# Patient Record
Sex: Male | Born: 1970
Health system: Southern US, Community
[De-identification: ages and names within clinical notes are randomized; demographics above are authoritative.]

## PROBLEM LIST (undated history)

## (undated) DIAGNOSIS — M2550 Pain in unspecified joint: Secondary | ICD-10-CM

## (undated) DIAGNOSIS — I10 Essential (primary) hypertension: Secondary | ICD-10-CM

## (undated) DIAGNOSIS — F988 Other specified behavioral and emotional disorders with onset usually occurring in childhood and adolescence: Secondary | ICD-10-CM

## (undated) DIAGNOSIS — M26609 Unspecified temporomandibular joint disorder, unspecified side: Secondary | ICD-10-CM

## (undated) DIAGNOSIS — M5030 Other cervical disc degeneration, unspecified cervical region: Secondary | ICD-10-CM

## (undated) DIAGNOSIS — F32A Depression, unspecified: Secondary | ICD-10-CM

## (undated) DIAGNOSIS — T7840XA Allergy, unspecified, initial encounter: Secondary | ICD-10-CM

## (undated) DIAGNOSIS — G629 Polyneuropathy, unspecified: Secondary | ICD-10-CM

## (undated) DIAGNOSIS — M1990 Unspecified osteoarthritis, unspecified site: Secondary | ICD-10-CM

## (undated) DIAGNOSIS — J45909 Unspecified asthma, uncomplicated: Secondary | ICD-10-CM

## (undated) DIAGNOSIS — M4302 Spondylolysis, cervical region: Secondary | ICD-10-CM

## (undated) DIAGNOSIS — E291 Testicular hypofunction: Secondary | ICD-10-CM

## (undated) DIAGNOSIS — F329 Major depressive disorder, single episode, unspecified: Secondary | ICD-10-CM

## (undated) DIAGNOSIS — N200 Calculus of kidney: Secondary | ICD-10-CM

## (undated) DIAGNOSIS — G894 Chronic pain syndrome: Secondary | ICD-10-CM

## (undated) HISTORY — DX: Calculus of kidney: N20.0

## (undated) HISTORY — DX: Spondylolysis, cervical region: M43.02

## (undated) HISTORY — DX: Pain in unspecified joint: M25.50

## (undated) HISTORY — PX: CHEST TUBE INSERTION: SHX231

## (undated) HISTORY — DX: Unspecified osteoarthritis, unspecified site: M19.90

## (undated) HISTORY — DX: Chronic pain syndrome: G89.4

## (undated) HISTORY — DX: Testicular hypofunction: E29.1

## (undated) HISTORY — DX: Other specified behavioral and emotional disorders with onset usually occurring in childhood and adolescence: F98.8

## (undated) HISTORY — DX: Depression, unspecified: F32.A

## (undated) HISTORY — DX: Unspecified temporomandibular joint disorder, unspecified side: M26.609

## (undated) HISTORY — DX: Polyneuropathy, unspecified: G62.9

## (undated) HISTORY — DX: Unspecified asthma, uncomplicated: J45.909

## (undated) HISTORY — DX: Essential (primary) hypertension: I10

## (undated) HISTORY — DX: Other cervical disc degeneration, unspecified cervical region: M50.30

## (undated) HISTORY — DX: Allergy, unspecified, initial encounter: T78.40XA

## (undated) HISTORY — DX: Major depressive disorder, single episode, unspecified: F32.9

---

## 2000-11-30 ENCOUNTER — Encounter: Payer: Self-pay | Admitting: Family Medicine

## 2000-11-30 ENCOUNTER — Ambulatory Visit (HOSPITAL_COMMUNITY): Admission: RE | Admit: 2000-11-30 | Discharge: 2000-11-30 | Payer: Self-pay | Admitting: Family Medicine

## 2001-01-15 ENCOUNTER — Encounter: Admission: RE | Admit: 2001-01-15 | Discharge: 2001-01-15 | Payer: Self-pay | Admitting: Occupational Medicine

## 2001-01-15 ENCOUNTER — Encounter: Payer: Self-pay | Admitting: Occupational Medicine

## 2001-03-02 ENCOUNTER — Emergency Department (HOSPITAL_COMMUNITY): Admission: EM | Admit: 2001-03-02 | Discharge: 2001-03-02 | Payer: Self-pay | Admitting: Emergency Medicine

## 2001-03-02 ENCOUNTER — Encounter: Payer: Self-pay | Admitting: Emergency Medicine

## 2002-01-24 HISTORY — PX: BILATERAL CARPAL TUNNEL RELEASE: SHX6508

## 2003-12-13 ENCOUNTER — Ambulatory Visit (HOSPITAL_COMMUNITY): Admission: RE | Admit: 2003-12-13 | Discharge: 2003-12-13 | Payer: Self-pay | Admitting: Orthopedic Surgery

## 2004-05-27 ENCOUNTER — Ambulatory Visit (HOSPITAL_COMMUNITY): Admission: RE | Admit: 2004-05-27 | Discharge: 2004-05-27 | Payer: Self-pay | Admitting: Family Medicine

## 2005-09-16 ENCOUNTER — Emergency Department (HOSPITAL_COMMUNITY): Admission: EM | Admit: 2005-09-16 | Discharge: 2005-09-16 | Payer: Self-pay | Admitting: *Deleted

## 2007-09-12 ENCOUNTER — Emergency Department (HOSPITAL_COMMUNITY): Admission: EM | Admit: 2007-09-12 | Discharge: 2007-09-12 | Payer: Self-pay | Admitting: Emergency Medicine

## 2007-10-02 ENCOUNTER — Emergency Department (HOSPITAL_COMMUNITY): Admission: EM | Admit: 2007-10-02 | Discharge: 2007-10-02 | Payer: Self-pay | Admitting: Emergency Medicine

## 2008-04-02 ENCOUNTER — Emergency Department (HOSPITAL_COMMUNITY): Admission: EM | Admit: 2008-04-02 | Discharge: 2008-04-02 | Payer: Self-pay | Admitting: Emergency Medicine

## 2009-05-20 ENCOUNTER — Ambulatory Visit: Payer: Self-pay | Admitting: Cardiology

## 2009-05-20 ENCOUNTER — Inpatient Hospital Stay (HOSPITAL_COMMUNITY): Admission: EM | Admit: 2009-05-20 | Discharge: 2009-05-29 | Payer: Self-pay | Admitting: Emergency Medicine

## 2009-05-22 ENCOUNTER — Encounter (INDEPENDENT_AMBULATORY_CARE_PROVIDER_SITE_OTHER): Payer: Self-pay | Admitting: Surgery

## 2010-01-04 ENCOUNTER — Emergency Department (HOSPITAL_COMMUNITY)
Admission: EM | Admit: 2010-01-04 | Discharge: 2010-01-04 | Payer: Self-pay | Source: Home / Self Care | Admitting: Emergency Medicine

## 2010-04-05 LAB — POCT I-STAT, CHEM 8
BUN: 17 mg/dL (ref 6–23)
Calcium, Ion: 1.05 mmol/L — ABNORMAL LOW (ref 1.12–1.32)
Chloride: 100 mEq/L (ref 96–112)
Creatinine, Ser: 1.4 mg/dL (ref 0.4–1.5)
Glucose, Bld: 119 mg/dL — ABNORMAL HIGH (ref 70–99)
HCT: 42 % (ref 39.0–52.0)
Hemoglobin: 14.3 g/dL (ref 13.0–17.0)
Potassium: 2.9 mEq/L — ABNORMAL LOW (ref 3.5–5.1)
Sodium: 136 mEq/L (ref 135–145)
TCO2: 25 mmol/L (ref 0–100)

## 2010-04-05 LAB — URINALYSIS, ROUTINE W REFLEX MICROSCOPIC
Glucose, UA: NEGATIVE mg/dL
Ketones, ur: 15 mg/dL — AB
Nitrite: NEGATIVE
Protein, ur: 100 mg/dL — AB
Specific Gravity, Urine: 1.029 (ref 1.005–1.030)
Urobilinogen, UA: 1 mg/dL (ref 0.0–1.0)
pH: 8 (ref 5.0–8.0)

## 2010-04-05 LAB — URINE MICROSCOPIC-ADD ON

## 2010-04-13 LAB — PREPARE RBC (CROSSMATCH)

## 2010-04-13 LAB — BASIC METABOLIC PANEL
BUN: 4 mg/dL — ABNORMAL LOW (ref 6–23)
BUN: 5 mg/dL — ABNORMAL LOW (ref 6–23)
BUN: 10 mg/dL (ref 6–23)
BUN: 12 mg/dL (ref 6–23)
BUN: 14 mg/dL (ref 6–23)
BUN: 15 mg/dL (ref 6–23)
CO2: 32 mEq/L (ref 19–32)
CO2: 34 mEq/L — ABNORMAL HIGH (ref 19–32)
CO2: 24 mEq/L (ref 19–32)
CO2: 25 mEq/L (ref 19–32)
CO2: 27 mEq/L (ref 19–32)
CO2: 28 mEq/L (ref 19–32)
Calcium: 7.9 mg/dL — ABNORMAL LOW (ref 8.4–10.5)
Calcium: 8 mg/dL — ABNORMAL LOW (ref 8.4–10.5)
Calcium: 7 mg/dL — ABNORMAL LOW (ref 8.4–10.5)
Calcium: 7.2 mg/dL — ABNORMAL LOW (ref 8.4–10.5)
Calcium: 7.4 mg/dL — ABNORMAL LOW (ref 8.4–10.5)
Calcium: 8 mg/dL — ABNORMAL LOW (ref 8.4–10.5)
Chloride: 96 mEq/L (ref 96–112)
Chloride: 98 mEq/L (ref 96–112)
Chloride: 101 mEq/L (ref 96–112)
Chloride: 104 mEq/L (ref 96–112)
Chloride: 104 mEq/L (ref 96–112)
Chloride: 105 mEq/L (ref 96–112)
Creatinine, Ser: 0.77 mg/dL (ref 0.4–1.5)
Creatinine, Ser: 0.77 mg/dL (ref 0.4–1.5)
Creatinine, Ser: 1.06 mg/dL (ref 0.4–1.5)
Creatinine, Ser: 1.17 mg/dL (ref 0.4–1.5)
Creatinine, Ser: 1.21 mg/dL (ref 0.4–1.5)
Creatinine, Ser: 1.25 mg/dL (ref 0.4–1.5)
GFR calc Af Amer: >60 mL/min (ref >60)
GFR calc Af Amer: >60 mL/min (ref >60)
GFR calc Af Amer: >60 mL/min (ref >60)
GFR calc Af Amer: >60 mL/min (ref >60)
GFR calc Af Amer: >60 mL/min (ref >60)
GFR calc Af Amer: >60 mL/min (ref >60)
GFR calc non Af Amer: >60 mL/min (ref >60)
GFR calc non Af Amer: >60 mL/min (ref >60)
GFR calc non Af Amer: >60 mL/min (ref >60)
GFR calc non Af Amer: >60 mL/min (ref >60)
GFR calc non Af Amer: >60 mL/min (ref >60)
GFR calc non Af Amer: >60 mL/min (ref >60)
Glucose, Bld: 121 mg/dL — ABNORMAL HIGH (ref 70–99)
Glucose, Bld: 128 mg/dL — ABNORMAL HIGH (ref 70–99)
Glucose, Bld: 134 mg/dL — ABNORMAL HIGH (ref 70–99)
Glucose, Bld: 142 mg/dL — ABNORMAL HIGH (ref 70–99)
Glucose, Bld: 171 mg/dL — ABNORMAL HIGH (ref 70–99)
Glucose, Bld: 190 mg/dL — ABNORMAL HIGH (ref 70–99)
Potassium: 3.5 mEq/L (ref 3.5–5.1)
Potassium: 4.6 mEq/L (ref 3.5–5.1)
Potassium: 3.9 mEq/L (ref 3.5–5.1)
Potassium: 4.5 mEq/L (ref 3.5–5.1)
Potassium: 4.8 mEq/L (ref 3.5–5.1)
Potassium: 5.4 mEq/L — ABNORMAL HIGH (ref 3.5–5.1)
Sodium: 133 mEq/L — ABNORMAL LOW (ref 135–145)
Sodium: 137 mEq/L (ref 135–145)
Sodium: 132 mEq/L — ABNORMAL LOW (ref 135–145)
Sodium: 134 mEq/L — ABNORMAL LOW (ref 135–145)
Sodium: 134 mEq/L — ABNORMAL LOW (ref 135–145)
Sodium: 136 mEq/L (ref 135–145)

## 2010-04-13 LAB — CBC
HCT: 21 % — ABNORMAL LOW (ref 39.0–52.0)
HCT: 24.5 % — ABNORMAL LOW (ref 39.0–52.0)
HCT: 24.5 % — ABNORMAL LOW (ref 39.0–52.0)
HCT: 24.6 % — ABNORMAL LOW (ref 39.0–52.0)
HCT: 25.3 % — ABNORMAL LOW (ref 39.0–52.0)
HCT: 25.9 % — ABNORMAL LOW (ref 39.0–52.0)
HCT: 26.5 % — ABNORMAL LOW (ref 39.0–52.0)
HCT: 28.4 % — ABNORMAL LOW (ref 39.0–52.0)
HCT: 23 % — ABNORMAL LOW (ref 39.0–52.0)
HCT: 25.4 % — ABNORMAL LOW (ref 39.0–52.0)
HCT: 26.9 % — ABNORMAL LOW (ref 39.0–52.0)
HCT: 29.6 % — ABNORMAL LOW (ref 39.0–52.0)
HCT: 31.9 % — ABNORMAL LOW (ref 39.0–52.0)
HCT: 38.6 % — ABNORMAL LOW (ref 39.0–52.0)
HCT: 41.5 % (ref 39.0–52.0)
Hemoglobin: 7.4 g/dL — ABNORMAL LOW (ref 13.0–17.0)
Hemoglobin: 8.6 g/dL — ABNORMAL LOW (ref 13.0–17.0)
Hemoglobin: 8.6 g/dL — ABNORMAL LOW (ref 13.0–17.0)
Hemoglobin: 8.6 g/dL — ABNORMAL LOW (ref 13.0–17.0)
Hemoglobin: 8.8 g/dL — ABNORMAL LOW (ref 13.0–17.0)
Hemoglobin: 8.9 g/dL — ABNORMAL LOW (ref 13.0–17.0)
Hemoglobin: 9.3 g/dL — ABNORMAL LOW (ref 13.0–17.0)
Hemoglobin: 9.9 g/dL — ABNORMAL LOW (ref 13.0–17.0)
Hemoglobin: 10.4 g/dL — ABNORMAL LOW (ref 13.0–17.0)
Hemoglobin: 11.1 g/dL — ABNORMAL LOW (ref 13.0–17.0)
Hemoglobin: 13.6 g/dL (ref 13.0–17.0)
Hemoglobin: 14.6 g/dL (ref 13.0–17.0)
Hemoglobin: 8.1 g/dL — ABNORMAL LOW (ref 13.0–17.0)
Hemoglobin: 9 g/dL — ABNORMAL LOW (ref 13.0–17.0)
Hemoglobin: 9.5 g/dL — ABNORMAL LOW (ref 13.0–17.0)
MCHC: 34.4 g/dL (ref 30.0–36.0)
MCHC: 34.7 g/dL (ref 30.0–36.0)
MCHC: 34.8 g/dL (ref 30.0–36.0)
MCHC: 34.9 g/dL (ref 30.0–36.0)
MCHC: 35.1 g/dL (ref 30.0–36.0)
MCHC: 35.2 g/dL (ref 30.0–36.0)
MCHC: 35.4 g/dL (ref 30.0–36.0)
MCHC: 35.5 g/dL (ref 30.0–36.0)
MCHC: 34.9 g/dL (ref 30.0–36.0)
MCHC: 35.1 g/dL (ref 30.0–36.0)
MCHC: 35.1 g/dL (ref 30.0–36.0)
MCHC: 35.2 g/dL (ref 30.0–36.0)
MCHC: 35.2 g/dL (ref 30.0–36.0)
MCHC: 35.3 g/dL (ref 30.0–36.0)
MCHC: 35.6 g/dL (ref 30.0–36.0)
MCV: 89.6 fL (ref 78.0–100.0)
MCV: 89.8 fL (ref 78.0–100.0)
MCV: 90.1 fL (ref 78.0–100.0)
MCV: 90.2 fL (ref 78.0–100.0)
MCV: 90.2 fL (ref 78.0–100.0)
MCV: 90.9 fL (ref 78.0–100.0)
MCV: 91.1 fL (ref 78.0–100.0)
MCV: 91.3 fL (ref 78.0–100.0)
MCV: 90.8 fL (ref 78.0–100.0)
MCV: 91.1 fL (ref 78.0–100.0)
MCV: 92.1 fL (ref 78.0–100.0)
MCV: 93.4 fL (ref 78.0–100.0)
MCV: 93.7 fL (ref 78.0–100.0)
MCV: 93.9 fL (ref 78.0–100.0)
MCV: 94.4 fL (ref 78.0–100.0)
Platelets: 139 10*3/uL — ABNORMAL LOW (ref 150–400)
Platelets: 181 10*3/uL (ref 150–400)
Platelets: 201 10*3/uL (ref 150–400)
Platelets: 208 10*3/uL (ref 150–400)
Platelets: 219 10*3/uL (ref 150–400)
Platelets: 248 10*3/uL (ref 150–400)
Platelets: 278 10*3/uL (ref 150–400)
Platelets: 344 10*3/uL (ref 150–400)
Platelets: 147 10*3/uL — ABNORMAL LOW (ref 150–400)
Platelets: 162 10*3/uL (ref 150–400)
Platelets: 175 10*3/uL (ref 150–400)
Platelets: 190 10*3/uL (ref 150–400)
Platelets: 192 10*3/uL (ref 150–400)
Platelets: 236 10*3/uL (ref 150–400)
Platelets: 241 10*3/uL (ref 150–400)
RBC: 2.31 MIL/uL — ABNORMAL LOW (ref 4.22–5.81)
RBC: 2.71 MIL/uL — ABNORMAL LOW (ref 4.22–5.81)
RBC: 2.73 MIL/uL — ABNORMAL LOW (ref 4.22–5.81)
RBC: 2.75 MIL/uL — ABNORMAL LOW (ref 4.22–5.81)
RBC: 2.8 MIL/uL — ABNORMAL LOW (ref 4.22–5.81)
RBC: 2.85 MIL/uL — ABNORMAL LOW (ref 4.22–5.81)
RBC: 2.94 MIL/uL — ABNORMAL LOW (ref 4.22–5.81)
RBC: 3.11 MIL/uL — ABNORMAL LOW (ref 4.22–5.81)
RBC: 2.52 MIL/uL — ABNORMAL LOW (ref 4.22–5.81)
RBC: 2.76 MIL/uL — ABNORMAL LOW (ref 4.22–5.81)
RBC: 2.97 MIL/uL — ABNORMAL LOW (ref 4.22–5.81)
RBC: 3.16 MIL/uL — ABNORMAL LOW (ref 4.22–5.81)
RBC: 3.4 MIL/uL — ABNORMAL LOW (ref 4.22–5.81)
RBC: 4.09 MIL/uL — ABNORMAL LOW (ref 4.22–5.81)
RBC: 4.44 MIL/uL (ref 4.22–5.81)
RDW: 14.8 % (ref 11.5–15.5)
RDW: 15.2 % (ref 11.5–15.5)
RDW: 15.4 % (ref 11.5–15.5)
RDW: 15.5 % (ref 11.5–15.5)
RDW: 15.6 % — ABNORMAL HIGH (ref 11.5–15.5)
RDW: 15.6 % — ABNORMAL HIGH (ref 11.5–15.5)
RDW: 15.6 % — ABNORMAL HIGH (ref 11.5–15.5)
RDW: 15.6 % — ABNORMAL HIGH (ref 11.5–15.5)
RDW: 13.4 % (ref 11.5–15.5)
RDW: 13.5 % (ref 11.5–15.5)
RDW: 13.6 % (ref 11.5–15.5)
RDW: 13.9 % (ref 11.5–15.5)
RDW: 14.5 % (ref 11.5–15.5)
RDW: 14.6 % (ref 11.5–15.5)
RDW: 15.1 % (ref 11.5–15.5)
WBC: 10.6 10*3/uL — ABNORMAL HIGH (ref 4.0–10.5)
WBC: 7.2 10*3/uL (ref 4.0–10.5)
WBC: 7.7 10*3/uL (ref 4.0–10.5)
WBC: 7.9 10*3/uL (ref 4.0–10.5)
WBC: 8.5 10*3/uL (ref 4.0–10.5)
WBC: 8.7 10*3/uL (ref 4.0–10.5)
WBC: 8.9 10*3/uL (ref 4.0–10.5)
WBC: 9 10*3/uL (ref 4.0–10.5)
WBC: 11.2 10*3/uL — ABNORMAL HIGH (ref 4.0–10.5)
WBC: 14.9 10*3/uL — ABNORMAL HIGH (ref 4.0–10.5)
WBC: 16.6 10*3/uL — ABNORMAL HIGH (ref 4.0–10.5)
WBC: 16.8 10*3/uL — ABNORMAL HIGH (ref 4.0–10.5)
WBC: 24.2 10*3/uL — ABNORMAL HIGH (ref 4.0–10.5)
WBC: 28.7 10*3/uL — ABNORMAL HIGH (ref 4.0–10.5)
WBC: 7.8 10*3/uL (ref 4.0–10.5)

## 2010-04-13 LAB — POCT I-STAT, CHEM 8
BUN: 14 mg/dL (ref 6–23)
Calcium, Ion: 1.05 mmol/L — ABNORMAL LOW (ref 1.12–1.32)
Chloride: 107 mEq/L (ref 96–112)
Creatinine, Ser: 0.9 mg/dL (ref 0.4–1.5)
Glucose, Bld: 118 mg/dL — ABNORMAL HIGH (ref 70–99)
HCT: 44 % (ref 39.0–52.0)
Hemoglobin: 15 g/dL (ref 13.0–17.0)
Potassium: 4.1 mEq/L (ref 3.5–5.1)
Sodium: 141 mEq/L (ref 135–145)
TCO2: 27 mmol/L (ref 0–100)

## 2010-04-13 LAB — APTT: aPTT: 23 seconds — ABNORMAL LOW (ref 24–37)

## 2010-04-13 LAB — CARDIAC PANEL(CRET KIN+CKTOT+MB+TROPI)
CK, MB: 1.6 ng/mL (ref 0.3–4.0)
CK, MB: 1.8 ng/mL (ref 0.3–4.0)
CK, MB: 1.8 ng/mL (ref 0.3–4.0)
Relative Index: 1.2 (ref 0.0–2.5)
Relative Index: 1.2 (ref 0.0–2.5)
Relative Index: 1.3 (ref 0.0–2.5)
Total CK: 130 U/L (ref 7–232)
Total CK: 138 U/L (ref 7–232)
Total CK: 150 U/L (ref 7–232)
Troponin I: 0.01 ng/mL (ref 0.00–0.06)
Troponin I: 0.01 ng/mL (ref 0.00–0.06)
Troponin I: 0.02 ng/mL (ref 0.00–0.06)

## 2010-04-13 LAB — CROSSMATCH
ABO/RH(D): A POS
ABO/RH(D): A POS
Antibody Screen: NEGATIVE
Antibody Screen: NEGATIVE

## 2010-04-13 LAB — COMPREHENSIVE METABOLIC PANEL
ALT: 19 U/L (ref 0–53)
AST: 29 U/L (ref 0–37)
Albumin: 4.3 g/dL (ref 3.5–5.2)
Alkaline Phosphatase: 38 U/L — ABNORMAL LOW (ref 39–117)
BUN: 11 mg/dL (ref 6–23)
CO2: 27 mEq/L (ref 19–32)
Calcium: 8.8 mg/dL (ref 8.4–10.5)
Chloride: 106 mEq/L (ref 96–112)
Creatinine, Ser: 1.04 mg/dL (ref 0.4–1.5)
GFR calc Af Amer: >60 mL/min (ref >60)
GFR calc non Af Amer: >60 mL/min (ref >60)
Glucose, Bld: 128 mg/dL — ABNORMAL HIGH (ref 70–99)
Potassium: 4.1 mEq/L (ref 3.5–5.1)
Sodium: 139 mEq/L (ref 135–145)
Total Bilirubin: 1.4 mg/dL — ABNORMAL HIGH (ref 0.3–1.2)
Total Protein: 6.5 g/dL (ref 6.0–8.3)

## 2010-04-13 LAB — PROTIME-INR
INR: 0.86 (ref 0.00–1.49)
INR: 0.94 (ref 0.00–1.49)
INR: 1.04 (ref 0.00–1.49)
INR: 1.07 (ref 0.00–1.49)
INR: 1.07 (ref 0.00–1.49)
INR: 1.14 (ref 0.00–1.49)
Prothrombin Time: 11.6 seconds (ref 11.6–15.2)
Prothrombin Time: 12.5 seconds (ref 11.6–15.2)
Prothrombin Time: 13.5 seconds (ref 11.6–15.2)
Prothrombin Time: 13.8 seconds (ref 11.6–15.2)
Prothrombin Time: 13.8 seconds (ref 11.6–15.2)
Prothrombin Time: 14.5 seconds (ref 11.6–15.2)

## 2010-04-13 LAB — MAGNESIUM: Magnesium: 1.6 mg/dL (ref 1.5–2.5)

## 2010-04-13 LAB — MRSA PCR SCREENING: MRSA by PCR: NEGATIVE

## 2010-04-13 LAB — LACTIC ACID, PLASMA: Lactic Acid, Venous: 2.1 mmol/L (ref 0.5–2.2)

## 2010-04-13 LAB — ABO/RH: ABO/RH(D): A POS

## 2010-04-13 LAB — GLUCOSE, CAPILLARY: Glucose-Capillary: 207 mg/dL — ABNORMAL HIGH (ref 70–99)

## 2010-10-27 LAB — URINALYSIS, ROUTINE W REFLEX MICROSCOPIC
Bilirubin Urine: NEGATIVE
Glucose, UA: NEGATIVE
Ketones, ur: NEGATIVE
Leukocytes, UA: NEGATIVE
Nitrite: NEGATIVE
Protein, ur: NEGATIVE
Specific Gravity, Urine: 1.02
Urobilinogen, UA: 0.2
pH: 6

## 2010-10-27 LAB — URINE MICROSCOPIC-ADD ON

## 2014-09-02 ENCOUNTER — Ambulatory Visit (INDEPENDENT_AMBULATORY_CARE_PROVIDER_SITE_OTHER): Payer: BLUE CROSS/BLUE SHIELD | Admitting: Family

## 2014-09-02 ENCOUNTER — Encounter: Payer: Self-pay | Admitting: Family

## 2014-09-02 VITALS — BP 110/78 | HR 78 | Temp 98.4°F | Resp 18 | Ht 71.0 in | Wt 220.0 lb

## 2014-09-02 DIAGNOSIS — F988 Other specified behavioral and emotional disorders with onset usually occurring in childhood and adolescence: Secondary | ICD-10-CM

## 2014-09-02 DIAGNOSIS — M25561 Pain in right knee: Secondary | ICD-10-CM

## 2014-09-02 DIAGNOSIS — M25569 Pain in unspecified knee: Secondary | ICD-10-CM | POA: Insufficient documentation

## 2014-09-02 DIAGNOSIS — G629 Polyneuropathy, unspecified: Secondary | ICD-10-CM

## 2014-09-02 DIAGNOSIS — F909 Attention-deficit hyperactivity disorder, unspecified type: Secondary | ICD-10-CM

## 2014-09-02 MED ORDER — GABAPENTIN 300 MG PO CAPS: 300.0000 mg | ORAL_CAPSULE | Freq: Three times a day (TID) | ORAL | Status: DC

## 2014-09-02 MED ORDER — TRAMADOL HCL 50 MG PO TABS: 50.0000 mg | ORAL_TABLET | Freq: Four times a day (QID) | ORAL | Status: DC

## 2014-09-02 MED ORDER — TRIAMCINOLONE ACETONIDE 0.1 % EX CREA: 1.0000 "application " | TOPICAL_CREAM | Freq: Two times a day (BID) | CUTANEOUS | Status: DC

## 2014-09-02 MED ORDER — AMPHETAMINE-DEXTROAMPHETAMINE 30 MG PO TABS: 30.0000 mg | ORAL_TABLET | Freq: Two times a day (BID) | ORAL | Status: DC

## 2014-09-02 NOTE — Assessment & Plan Note (Signed)
Previously diagnosed with ADD. Obtain previous records to verify. Obtain urine drug screen and complete Controlled Substance Contract. [place] Controlled Substance Database reviewed and appears consistent. Refill Adderall x 1 month. Denies adverse side effects. Follow up in 1 month.
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
## 2014-09-02 NOTE — Assessment & Plan Note (Signed)
Knee pain with no obvious source, although patient mentions the word plica. Anterior and posterior knee pain noted with no ligamentous or meniscal pathology. Obtain previous paperwork and results. Continue current treatment of tramadol as needed. Follow up pending receiving paperwork.

## 2014-09-02 NOTE — Patient Instructions (Signed)
Thank you for choosing [facility].  Summary/Instructions:  Please continue to take your medications as prescribed.   Your prescription(s) have been submitted to your pharmacy or been printed and provided for you. Please take as directed and contact our office if you believe you are having problem(s) with the medication(s) or have any questions.  If your symptoms worsen or fail to improve, please contact our office for further instruction, or in case of emergency go directly to the emergency room at the closest medical facility.     
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
## 2014-09-02 NOTE — Progress Notes (Signed)
Pre visit review using our clinic review tool, if applicable. No additional management support is needed unless otherwise documented below in the visit note. 

## 2014-09-15 ENCOUNTER — Telehealth: Payer: Self-pay | Admitting: Family

## 2014-09-15 NOTE — Telephone Encounter (Signed)
Please inform patient that his urine drug screen was positive for THC and hydrocodone and hydomorphone, neither of which have been listed to be prescribed to him. Therefore we will be unable to continue to provide any controlled substances, including Adderall, however will be happy to provide for his primary care needs.

## 2014-09-16 NOTE — Telephone Encounter (Signed)
Pt aware. He did mention he had an old rx of hydrocodone that he took. Per Tammy Sours pt will be test again.

## 2014-09-17 ENCOUNTER — Telehealth: Payer: Self-pay | Admitting: Family

## 2014-09-17 NOTE — Telephone Encounter (Signed)
Received records from [facility] @ [facility]. Sent to [facility]. 09/17/14/ss

## 2014-09-30 ENCOUNTER — Telehealth: Payer: Self-pay | Admitting: *Deleted

## 2014-09-30 DIAGNOSIS — M25561 Pain in right knee: Secondary | ICD-10-CM

## 2014-09-30 DIAGNOSIS — G629 Polyneuropathy, unspecified: Secondary | ICD-10-CM

## 2014-09-30 DIAGNOSIS — F988 Other specified behavioral and emotional disorders with onset usually occurring in childhood and adolescence: Secondary | ICD-10-CM

## 2014-09-30 MED ORDER — GABAPENTIN 300 MG PO CAPS: 300.0000 mg | ORAL_CAPSULE | Freq: Three times a day (TID) | ORAL | Status: DC

## 2014-09-30 MED ORDER — TRAMADOL HCL 50 MG PO TABS: 50.0000 mg | ORAL_TABLET | Freq: Four times a day (QID) | ORAL | Status: DC

## 2014-09-30 MED ORDER — AMPHETAMINE-DEXTROAMPHETAMINE 30 MG PO TABS
30.0000 mg | ORAL_TABLET | Freq: Two times a day (BID) | ORAL | Status: DC
Start: 2014-09-30 — End: 2014-10-27

## 2014-09-30 NOTE — Telephone Encounter (Signed)
Notified pt with Billy Bray. Place rx in cabinet for pick-up...Raechel Chute

## 2014-09-30 NOTE — Telephone Encounter (Signed)
Left msg on triage requesting refills on Adderral, Tramadol, and Gabapentin. Sent Gabapentin pls advise on controls....Raechel Chute

## 2014-09-30 NOTE — Telephone Encounter (Signed)
Medications refilled - will need OV for next refill of Adderall.

## 2014-10-27 ENCOUNTER — Telehealth: Payer: Self-pay | Admitting: *Deleted

## 2014-10-27 DIAGNOSIS — M25561 Pain in right knee: Secondary | ICD-10-CM

## 2014-10-27 DIAGNOSIS — G629 Polyneuropathy, unspecified: Secondary | ICD-10-CM

## 2014-10-27 DIAGNOSIS — F988 Other specified behavioral and emotional disorders with onset usually occurring in childhood and adolescence: Secondary | ICD-10-CM

## 2014-10-27 MED ORDER — AMPHETAMINE-DEXTROAMPHETAMINE 30 MG PO TABS: 30.0000 mg | ORAL_TABLET | Freq: Two times a day (BID) | ORAL | Status: DC

## 2014-10-27 MED ORDER — TRAMADOL HCL 50 MG PO TABS: 50.0000 mg | ORAL_TABLET | Freq: Four times a day (QID) | ORAL | Status: DC

## 2014-10-27 MED ORDER — GABAPENTIN 300 MG PO CAPS: 300.0000 mg | ORAL_CAPSULE | Freq: Three times a day (TID) | ORAL | Status: DC

## 2014-10-27 NOTE — Telephone Encounter (Signed)
Left msg on triage requesting refills on his Adderral, Gabapentin, and Tramadol. Pls advise...Raechel Chute

## 2014-10-27 NOTE — Telephone Encounter (Signed)
Will need office visit for additional Adderall.

## 2014-11-24 ENCOUNTER — Telehealth: Payer: Self-pay | Admitting: *Deleted

## 2014-11-24 DIAGNOSIS — M25561 Pain in right knee: Secondary | ICD-10-CM

## 2014-11-24 DIAGNOSIS — G629 Polyneuropathy, unspecified: Secondary | ICD-10-CM

## 2014-11-24 MED ORDER — TRAMADOL HCL 50 MG PO TABS: 50.0000 mg | ORAL_TABLET | Freq: Four times a day (QID) | ORAL | Status: DC

## 2014-11-24 MED ORDER — GABAPENTIN 300 MG PO CAPS: 300.0000 mg | ORAL_CAPSULE | Freq: Three times a day (TID) | ORAL | Status: DC

## 2014-11-24 NOTE — Telephone Encounter (Signed)
Notified pt with Tammy SoursGreg response. Made appt for 11/26/14 @3 :30.../lmb

## 2014-11-24 NOTE — Telephone Encounter (Signed)
Tramadol and gabapentin will be filled. OV required for Adderall.

## 2014-11-24 NOTE — Telephone Encounter (Signed)
Left msg on triage requesting refills on his Adderral, Tramadol, and Gabapentin. Sent gabapentin pls advise on controls...Raechel Chute/lmb

## 2014-11-26 ENCOUNTER — Ambulatory Visit (INDEPENDENT_AMBULATORY_CARE_PROVIDER_SITE_OTHER): Payer: BLUE CROSS/BLUE SHIELD | Admitting: Family

## 2014-11-26 ENCOUNTER — Encounter: Payer: Self-pay | Admitting: Family

## 2014-11-26 VITALS — BP 140/88 | HR 96 | Temp 98.4°F | Resp 18 | Ht 71.0 in | Wt 213.0 lb

## 2014-11-26 DIAGNOSIS — M25561 Pain in right knee: Secondary | ICD-10-CM | POA: Diagnosis not present

## 2014-11-26 DIAGNOSIS — F909 Attention-deficit hyperactivity disorder, unspecified type: Secondary | ICD-10-CM | POA: Diagnosis not present

## 2014-11-26 DIAGNOSIS — F988 Other specified behavioral and emotional disorders with onset usually occurring in childhood and adolescence: Secondary | ICD-10-CM

## 2014-11-26 MED ORDER — AMPHETAMINE-DEXTROAMPHETAMINE 30 MG PO TABS: 30.0000 mg | ORAL_TABLET | Freq: Two times a day (BID) | ORAL | Status: DC

## 2014-11-26 MED ORDER — OXYCODONE-ACETAMINOPHEN 5-325 MG PO TABS: 1.0000 | ORAL_TABLET | Freq: Three times a day (TID) | ORAL | Status: DC | PRN

## 2014-11-26 NOTE — Patient Instructions (Addendum)
Thank you for choosing [facility].  Summary/Instructions:  Your prescription(s) have been submitted to your pharmacy or been printed and provided for you. Please take as directed and contact our office if you believe you are having problem(s) with the medication(s) or have any questions.  If your symptoms worsen or fail to improve, please contact our office for further instruction, or in case of emergency go directly to the emergency room at the closest medical facility.   Please continue your medications as prescribed.

## 2014-11-26 NOTE — Progress Notes (Signed)
Subjective:    Patient ID: Billy Bray, male    DOB: Oct 25, 1970, 44 y.o.   MRN: [id number]  Chief Complaint  Patient presents with  . Medication follow up    needs refill of adderall and wants to talk about being put back on percocet for knee and rib pain due to weather changes    HPI:  Billy Bray is a 44 y.o. male who  has a past medical history of Asthma; Arthritis; Depression; Allergy; and Kidney stones. and presents today for an acute office visit.   1.) ADD - Currently maintained on Adderall. Takes the medication as prescribed and denies adverse side effects. Reports that his attention is adequately with the current regimen. Appetite is slightly decreased and has lost about 7 pounds since previous office visit. Reports sleeping well. Denies cardiac symptoms.   Wt Readings from Last 3 Encounters:  11/26/14 213 lb (96.616 kg)  09/02/14 220 lb (99.791 kg)    2.) Knee pain - Previously diagnosed with undetermined knee pain possibly related to knee plica. Continues to experience anterior and posterior knee pain described as a "screwdriver being stuck in it." Notes increased pain when cold weather sets in and was previously maintained on Percocet for the pain which helped to regulate his pain. Current pain severity is moderate to severe at times even with the tramadol. It does effect his ability to function at times.   Allergies  Allergen Reactions  . Amoxicillin Hives     Current Outpatient Prescriptions on File Prior to Visit  Medication Sig Dispense Refill  . albuterol (PROVENTIL HFA;VENTOLIN HFA) 108 (90 BASE) MCG/ACT inhaler Inhale 1-2 puffs into the lungs every 6 (six) hours as needed for wheezing or shortness of breath.    . gabapentin (NEURONTIN) 300 MG capsule Take 1 capsule (300 mg total) by mouth 3 (three) times daily. 90 capsule 2  . traMADol (ULTRAM) 50 MG tablet Take 1 tablet (50 mg total) by mouth 4 (four) times daily. 120 tablet 0  . triamcinolone cream (KENALOG)  0.1 % Apply 1 application topically 2 (two) times daily. 30 g 0   No current facility-administered medications on file prior to visit.    Review of Systems  Constitutional: Negative for diaphoresis, appetite change and unexpected weight change.  Respiratory: Negative for chest tightness and shortness of breath.   Cardiovascular: Negative for chest pain, palpitations and leg swelling.  Neurological: Negative for headaches.  Psychiatric/Behavioral: Negative for sleep disturbance and decreased concentration. The patient is not nervous/anxious.       Objective:    BP 140/88 mmHg  Pulse 96  Temp(Src) 98.4 F (36.9 C) (Oral)  Resp 18  Ht  (1.803 m)  Wt 213 lb (96.616 kg)  BMI 29.72 kg/m2  SpO2 97% Nursing note and vital signs reviewed.  Physical Exam  Constitutional: He is oriented to person, place, and time. He appears well-developed and well-nourished. No distress.  Cardiovascular: Normal rate, regular rhythm, normal heart sounds and intact distal pulses.   Pulmonary/Chest: Effort normal and breath sounds normal.  Musculoskeletal:  Right knee- No obvious deformity, discoloration or edema noted. There is anterior and posterior tenderness that is generalized. Range of motion is WNL. Ligamentous and meniscal testing is negative. Distal pulses and sensation are intact and appropriate.   Neurological: He is alert and oriented to person, place, and time.  Skin: Skin is warm and dry.  Psychiatric: He has a normal mood and affect. His behavior is normal. Judgment  and thought content normal.       Assessment & Plan:   Problem List Items Addressed This Visit      Other   ADD (attention deficit disorder) - Primary    Attention is stable with current dose of Adderall with no adverse effects noted. Continue current dosage of Adderall and follow up in 3 months.       Relevant Medications   amphetamine-dextroamphetamine (ADDERALL) 30 MG tablet   Knee pain    Continues to  experience right knee pain of undetermined origin. Recommend continued home exercise therapy and ice/heat as needed. Will increase pain meds to include Percocet at this time. Consider referral to orthopedics as continuing to await the results of previous tests. [place] Controlled Substance Database reviewed with no irregularities.       Relevant Medications   oxyCODONE-acetaminophen (ROXICET) 5-325 MG tablet

## 2014-11-26 NOTE — Assessment & Plan Note (Signed)
Continues to experience right knee pain of undetermined origin. Recommend continued home exercise therapy and ice/heat as needed. Will increase pain meds to include Percocet at this time. Consider referral to orthopedics as continuing to await the results of previous tests. [place] Controlled Substance Database reviewed with no irregularities.

## 2014-11-26 NOTE — Progress Notes (Signed)
Pre visit review using our clinic review tool, if applicable. No additional management support is needed unless otherwise documented below in the visit note. 

## 2014-11-26 NOTE — Assessment & Plan Note (Signed)
Attention is stable with current dose of Adderall with no adverse effects noted. Continue current dosage of Adderall and follow up in 3 months.

## 2014-12-23 ENCOUNTER — Telehealth: Payer: Self-pay | Admitting: *Deleted

## 2014-12-23 DIAGNOSIS — G629 Polyneuropathy, unspecified: Secondary | ICD-10-CM

## 2014-12-23 DIAGNOSIS — M25561 Pain in right knee: Secondary | ICD-10-CM

## 2014-12-23 DIAGNOSIS — F988 Other specified behavioral and emotional disorders with onset usually occurring in childhood and adolescence: Secondary | ICD-10-CM

## 2014-12-23 MED ORDER — TRAMADOL HCL 50 MG PO TABS: 50.0000 mg | ORAL_TABLET | Freq: Four times a day (QID) | ORAL | Status: DC

## 2014-12-23 MED ORDER — GABAPENTIN 300 MG PO CAPS: 300.0000 mg | ORAL_CAPSULE | Freq: Three times a day (TID) | ORAL | Status: DC

## 2014-12-23 MED ORDER — AMPHETAMINE-DEXTROAMPHETAMINE 30 MG PO TABS: 30.0000 mg | ORAL_TABLET | Freq: Two times a day (BID) | ORAL | Status: DC

## 2014-12-23 NOTE — Telephone Encounter (Signed)
Notified pt rx's ready for pick-up../lmb 

## 2014-12-23 NOTE — Telephone Encounter (Signed)
Medications refilled

## 2014-12-23 NOTE — Telephone Encounter (Signed)
Left msg on triage requesting refills on his Tramadol, Adderral, and Gabapentin...Raechel Chute/lmb

## 2014-12-25 ENCOUNTER — Telehealth: Payer: Self-pay | Admitting: Family

## 2014-12-25 DIAGNOSIS — M25561 Pain in right knee: Secondary | ICD-10-CM

## 2014-12-25 MED ORDER — OXYCODONE-ACETAMINOPHEN 5-325 MG PO TABS: 1.0000 | ORAL_TABLET | Freq: Three times a day (TID) | ORAL | Status: DC | PRN

## 2014-12-25 NOTE — Telephone Encounter (Signed)
Medication printed

## 2014-12-25 NOTE — Telephone Encounter (Signed)
Pt call request refill for oxyCODONE-acetaminophen (ROXICET) 5-325 MG.

## 2014-12-29 NOTE — Telephone Encounter (Signed)
Pt aware its ready for pick up .

## 2015-01-22 ENCOUNTER — Other Ambulatory Visit: Payer: Self-pay | Admitting: Family

## 2015-01-22 ENCOUNTER — Telehealth: Payer: Self-pay | Admitting: *Deleted

## 2015-01-22 DIAGNOSIS — M25561 Pain in right knee: Secondary | ICD-10-CM

## 2015-01-22 DIAGNOSIS — F988 Other specified behavioral and emotional disorders with onset usually occurring in childhood and adolescence: Secondary | ICD-10-CM

## 2015-01-22 DIAGNOSIS — G629 Polyneuropathy, unspecified: Secondary | ICD-10-CM

## 2015-01-22 MED ORDER — TRAMADOL HCL 50 MG PO TABS: 50.0000 mg | ORAL_TABLET | Freq: Four times a day (QID) | ORAL | Status: DC

## 2015-01-22 MED ORDER — GABAPENTIN 300 MG PO CAPS: 300.0000 mg | ORAL_CAPSULE | Freq: Three times a day (TID) | ORAL | Status: DC

## 2015-01-22 MED ORDER — OXYCODONE-ACETAMINOPHEN 5-325 MG PO TABS: 1.0000 | ORAL_TABLET | Freq: Three times a day (TID) | ORAL | Status: DC | PRN

## 2015-01-22 MED ORDER — AMPHETAMINE-DEXTROAMPHETAMINE 30 MG PO TABS: 30.0000 mg | ORAL_TABLET | Freq: Two times a day (BID) | ORAL | Status: DC

## 2015-01-22 MED ORDER — AMPHETAMINE-DEXTROAMPHETAMINE 30 MG PO TABS
30.0000 mg | ORAL_TABLET | Freq: Two times a day (BID) | ORAL | Status: DC
Start: 2015-01-22 — End: 2015-01-22

## 2015-01-22 NOTE — Telephone Encounter (Signed)
Notified pt Billy KennerGreg Ok refills rx's ready for pick-up...Raechel Chute/lmb

## 2015-01-22 NOTE — Telephone Encounter (Signed)
Medication refilled

## 2015-01-22 NOTE — Telephone Encounter (Signed)
Receive call pt requesting refills on his Adderral, Gabapentin, and Tramadol...Raechel Chute/lmb

## 2015-02-18 ENCOUNTER — Telehealth: Payer: Self-pay | Admitting: *Deleted

## 2015-02-18 DIAGNOSIS — M25561 Pain in right knee: Secondary | ICD-10-CM

## 2015-02-18 DIAGNOSIS — G629 Polyneuropathy, unspecified: Secondary | ICD-10-CM

## 2015-02-18 NOTE — Telephone Encounter (Signed)
Left msg on triage needing refills on his Adderral, Gabapentin, and Tramadol. Pt not due until 02/22/15 will hold until Tammy Sours return tomorrow 02/19/15 for approval.../lmb

## 2015-02-19 MED ORDER — GABAPENTIN 300 MG PO CAPS: 300.0000 mg | ORAL_CAPSULE | Freq: Three times a day (TID) | ORAL | Status: DC

## 2015-02-19 MED ORDER — TRAMADOL HCL 50 MG PO TABS: 50.0000 mg | ORAL_TABLET | Freq: Four times a day (QID) | ORAL | Status: DC

## 2015-02-19 NOTE — Telephone Encounter (Signed)
Needs OV for Adderall refill.  Others refilled.

## 2015-02-19 NOTE — Telephone Encounter (Signed)
Notified pt with Billy Bray response pt states he didn't need the gabapentin he need the Oxycodone. Per Billy Bray still need OV any schedule 2 drugs must be seen every 3 months . Called pt back gave him [facility] response made appt for tomorrow 02/20/15,,/lmb

## 2015-02-20 ENCOUNTER — Encounter: Payer: Self-pay | Admitting: Family

## 2015-02-20 ENCOUNTER — Ambulatory Visit (INDEPENDENT_AMBULATORY_CARE_PROVIDER_SITE_OTHER): Payer: BLUE CROSS/BLUE SHIELD | Admitting: Family

## 2015-02-20 VITALS — BP 130/84 | HR 90 | Temp 98.0°F | Resp 16 | Ht 71.0 in | Wt 216.0 lb

## 2015-02-20 DIAGNOSIS — R2230 Localized swelling, mass and lump, unspecified upper limb: Secondary | ICD-10-CM | POA: Insufficient documentation

## 2015-02-20 DIAGNOSIS — F909 Attention-deficit hyperactivity disorder, unspecified type: Secondary | ICD-10-CM

## 2015-02-20 DIAGNOSIS — M25561 Pain in right knee: Secondary | ICD-10-CM

## 2015-02-20 DIAGNOSIS — R2231 Localized swelling, mass and lump, right upper limb: Secondary | ICD-10-CM | POA: Diagnosis not present

## 2015-02-20 DIAGNOSIS — F988 Other specified behavioral and emotional disorders with onset usually occurring in childhood and adolescence: Secondary | ICD-10-CM

## 2015-02-20 MED ORDER — OXYCODONE-ACETAMINOPHEN 5-325 MG PO TABS: 1.0000 | ORAL_TABLET | Freq: Three times a day (TID) | ORAL | Status: DC | PRN

## 2015-02-20 MED ORDER — AMPHETAMINE-DEXTROAMPHETAMINE 30 MG PO TABS: 30.0000 mg | ORAL_TABLET | Freq: Two times a day (BID) | ORAL | Status: DC

## 2015-02-20 NOTE — Assessment & Plan Note (Signed)
Continues to experience chronic right knee pain of undetermined origin with previously normal MRIs and treatment failures with therapy, cortisone injections, and home exercise therapy. Adequately controlled with current dosage of oxycodone-acetaminophen. Continue current dosage of oxycodone-acetaminophen. [place] controlled substance database reviewed with no irregularities.

## 2015-02-20 NOTE — Patient Instructions (Signed)
Thank you for choosing [facility].  Summary/Instructions:  Your prescription(s) have been submitted to your pharmacy or been printed and provided for you. Please take as directed and contact our office if you believe you are having problem(s) with the medication(s) or have any questions.  If your symptoms worsen or fail to improve, please contact our office for further instruction, or in case of emergency go directly to the emergency room at the closest medical facility.   Please continue to take your medication as prescribed.  Please continue to monitor the lump under your arm.

## 2015-02-20 NOTE — Progress Notes (Signed)
Pre visit review using our clinic review tool, if applicable. No additional management support is needed unless otherwise documented below in the visit note. 

## 2015-02-20 NOTE — Assessment & Plan Note (Signed)
ADD is currently stable with current regimen of Adderall. Currently he obtains adequate sleep with no change in weight. Continue current dosage of Adderall. [place] controlled substance database reviewed with no irregularities. Follow-up in 3 months.

## 2015-02-20 NOTE — Assessment & Plan Note (Signed)
Small mildly tender lump noted in lower aspect of right axilla. Most likely benign. Continue to monitor at this time if symptoms worsen or fail to improve or there is change in size follow-up for further potential imaging.

## 2015-02-20 NOTE — Progress Notes (Signed)
Subjective:    Patient ID: Billy Bray, male    DOB: 03-20-1970, 45 y.o.   MRN: [id number]  Chief Complaint  Patient presents with  . Follow-up    Refill of adderall and oxycodone, tender lump underneath right arm pit    HPI:  Billy Bray is a 45 y.o. male who  has a past medical history of Asthma; Arthritis; Depression; Allergy; and Kidney stones. and presents today for a follow up office visit.   1.) Chronic knee pain - Maintained on Roxicet. Takes the medication as prescribed and denies adverse side effects or constipation. Reports that the medication is helping his knee slightly. Has noted recently with a lower sitting vehicle it begins to hurt his knee more which is modified by adjusting his position.   2.) Lump  - this is a new problem. Associated symptom of a lump located on his right side has been going on for a couple of weeks. Denies any changes in size with mild discomfort. There are no modifying factors that make it better or worse. Has not attempted any treatments.   3.) ADD - currently maintained on Adderall. Takes medication as prescribed and denies adverse side effects. Currently averaging approximately 6 hours of sleep. Reports adequate nutritional intake with no significant weight changes. Denies chest pain, shortness of breath, or heart palpitations.  Allergies  Allergen Reactions  . Amoxicillin Hives     Current Outpatient Prescriptions on File Prior to Visit  Medication Sig Dispense Refill  . albuterol (PROVENTIL HFA;VENTOLIN HFA) 108 (90 BASE) MCG/ACT inhaler Inhale 1-2 puffs into the lungs every 6 (six) hours as needed for wheezing or shortness of breath.    . gabapentin (NEURONTIN) 300 MG capsule Take 1 capsule (300 mg total) by mouth 3 (three) times daily. 90 capsule 0  . traMADol (ULTRAM) 50 MG tablet Take 1 tablet (50 mg total) by mouth 4 (four) times daily. 120 tablet 0  . triamcinolone cream (KENALOG) 0.1 % Apply 1 application topically 2 (two) times  daily. 30 g 0   No current facility-administered medications on file prior to visit.    Past Medical History  Diagnosis Date  . Asthma   . Arthritis   . Depression   . Allergy   . Kidney stones     Review of Systems  Constitutional: Negative for fever and chills.  Musculoskeletal:       Positive for knee pain.  Skin:       Positive for lump around lower aspect of right axilla  Neurological: Negative for weakness and numbness.      Objective:    BP 130/84 mmHg  Pulse 90  Temp(Src) 98 F (36.7 C) (Oral)  Resp 16  Ht  (1.803 m)  Wt 216 lb (97.977 kg)  BMI 30.14 kg/m2  SpO2 97% Nursing note and vital signs reviewed.  Physical Exam  Constitutional: He is oriented to person, place, and time. He appears well-developed and well-nourished. No distress.  Cardiovascular: Normal rate, regular rhythm, normal heart sounds and intact distal pulses.   Pulmonary/Chest: Effort normal and breath sounds normal.  Musculoskeletal:  Right knee- No obvious deformity, discoloration or edema noted. There is anterior and posterior tenderness that is generalized. Range of motion is WNL. Ligamentous and meniscal testing is negative. Distal pulses and sensation are intact and appropriate.   Neurological: He is alert and oriented to person, place, and time.  Skin: Skin is warm and dry.     Psychiatric:  He has a normal mood and affect. His behavior is normal. Judgment and thought content normal.       Assessment & Plan:   Problem List Items Addressed This Visit      Other   ADD (attention deficit disorder)    ADD is currently stable with current regimen of Adderall. Currently he obtains adequate sleep with no change in weight. Continue current dosage of Adderall. [place] controlled substance database reviewed with no irregularities. Follow-up in 3 months.      Relevant Medications   amphetamine-dextroamphetamine (ADDERALL) 30 MG tablet   Knee pain - Primary    Continues to experience  chronic right knee pain of undetermined origin with previously normal MRIs and treatment failures with therapy, cortisone injections, and home exercise therapy. Adequately controlled with current dosage of oxycodone-acetaminophen. Continue current dosage of oxycodone-acetaminophen. [place] controlled substance database reviewed with no irregularities.      Relevant Medications   oxyCODONE-acetaminophen (ROXICET) 5-325 MG tablet   Lump in armpit    Small mildly tender lump noted in lower aspect of right axilla. Most likely benign. Continue to monitor at this time if symptoms worsen or fail to improve or there is change in size follow-up for further potential imaging.

## 2015-02-24 ENCOUNTER — Telehealth: Payer: Self-pay | Admitting: Family

## 2015-02-24 NOTE — Telephone Encounter (Signed)
Pt states that [facility] on [address] has his prescription for amphetamine-dextroamphetamine (ADDERALL) 30 MG tablet [[id number]]  On the prescription it says not to fill till 2/2. His last prescription was filled on 12/29 and he is wondering why it says 2/2. He states the pharmacy has also called regarding this.

## 2015-02-24 NOTE — Telephone Encounter (Signed)
Ok to fill now

## 2015-02-24 NOTE — Telephone Encounter (Signed)
Please advise 

## 2015-02-24 NOTE — Telephone Encounter (Signed)
Called pharmacy and gave the ok to fill the adderall now. Pt is aware.

## 2015-03-23 ENCOUNTER — Telehealth: Payer: Self-pay

## 2015-03-23 DIAGNOSIS — M25561 Pain in right knee: Secondary | ICD-10-CM

## 2015-03-23 DIAGNOSIS — F988 Other specified behavioral and emotional disorders with onset usually occurring in childhood and adolescence: Secondary | ICD-10-CM

## 2015-03-23 DIAGNOSIS — G629 Polyneuropathy, unspecified: Secondary | ICD-10-CM

## 2015-03-23 MED ORDER — AMPHETAMINE-DEXTROAMPHETAMINE 30 MG PO TABS: 30.0000 mg | ORAL_TABLET | Freq: Two times a day (BID) | ORAL | Status: DC

## 2015-03-23 MED ORDER — OXYCODONE-ACETAMINOPHEN 5-325 MG PO TABS: 1.0000 | ORAL_TABLET | Freq: Three times a day (TID) | ORAL | Status: DC | PRN

## 2015-03-23 MED ORDER — TRAMADOL HCL 50 MG PO TABS: 50.0000 mg | ORAL_TABLET | Freq: Four times a day (QID) | ORAL | Status: DC

## 2015-03-23 NOTE — Telephone Encounter (Signed)
Pt informed, Rx in cabinet for pt pick up  

## 2015-03-23 NOTE — Telephone Encounter (Signed)
Pt called requesting refills on Adderall, Percocet, and Tramadol

## 2015-03-23 NOTE — Telephone Encounter (Signed)
Medication available to be picked up.

## 2015-04-08 ENCOUNTER — Other Ambulatory Visit: Payer: Self-pay | Admitting: Family

## 2015-04-16 ENCOUNTER — Telehealth: Payer: Self-pay | Admitting: *Deleted

## 2015-04-16 DIAGNOSIS — M25561 Pain in right knee: Secondary | ICD-10-CM

## 2015-04-16 DIAGNOSIS — F988 Other specified behavioral and emotional disorders with onset usually occurring in childhood and adolescence: Secondary | ICD-10-CM

## 2015-04-16 DIAGNOSIS — G629 Polyneuropathy, unspecified: Secondary | ICD-10-CM

## 2015-04-16 NOTE — Telephone Encounter (Signed)
Received call pt requesting refills on his Adderral, Percocet, and Tramadol...Raechel Chute/lmb

## 2015-04-17 ENCOUNTER — Telehealth: Payer: Self-pay | Admitting: Family

## 2015-04-17 ENCOUNTER — Other Ambulatory Visit: Payer: Self-pay

## 2015-04-17 MED ORDER — TRAMADOL HCL 50 MG PO TABS: 50.0000 mg | ORAL_TABLET | Freq: Four times a day (QID) | ORAL | Status: DC

## 2015-04-17 MED ORDER — ALBUTEROL SULFATE (2.5 MG/3ML) 0.083% IN NEBU: 2.5000 mg | INHALATION_SOLUTION | Freq: Four times a day (QID) | RESPIRATORY_TRACT | Status: AC | PRN

## 2015-04-17 MED ORDER — OXYCODONE-ACETAMINOPHEN 5-325 MG PO TABS: 1.0000 | ORAL_TABLET | Freq: Three times a day (TID) | ORAL | Status: DC | PRN

## 2015-04-17 MED ORDER — ALBUTEROL SULFATE HFA 108 (90 BASE) MCG/ACT IN AERS: 1.0000 | INHALATION_SPRAY | Freq: Four times a day (QID) | RESPIRATORY_TRACT | Status: DC | PRN

## 2015-04-17 MED ORDER — AMPHETAMINE-DEXTROAMPHETAMINE 30 MG PO TABS: 30.0000 mg | ORAL_TABLET | Freq: Two times a day (BID) | ORAL | Status: DC

## 2015-04-17 NOTE — Telephone Encounter (Signed)
Pls advise on msg below.../lmb 

## 2015-04-17 NOTE — Telephone Encounter (Signed)
Rx sent. Pt aware.  

## 2015-04-17 NOTE — Telephone Encounter (Signed)
Please advise 

## 2015-04-17 NOTE — Telephone Encounter (Signed)
Pt called in said that he needs a refill on his albuterol (PROVENTIL HFA;VENTOLIN HFA) 108 (90 BASE) MCG/ACT inhaler [[id number]][[id number]]

## 2015-04-17 NOTE — Telephone Encounter (Signed)
[place] pt no answer [facility] rx ready for pick-up...Raechel Chute/lmb

## 2015-04-17 NOTE — Telephone Encounter (Signed)
Medications refilled

## 2015-05-18 ENCOUNTER — Ambulatory Visit (INDEPENDENT_AMBULATORY_CARE_PROVIDER_SITE_OTHER): Payer: BLUE CROSS/BLUE SHIELD | Admitting: Family

## 2015-05-18 ENCOUNTER — Encounter: Payer: Self-pay | Admitting: Family

## 2015-05-18 VITALS — BP 142/88 | HR 115 | Temp 98.5°F | Resp 18 | Ht 71.0 in | Wt 206.0 lb

## 2015-05-18 DIAGNOSIS — F988 Other specified behavioral and emotional disorders with onset usually occurring in childhood and adolescence: Secondary | ICD-10-CM

## 2015-05-18 DIAGNOSIS — M79601 Pain in right arm: Secondary | ICD-10-CM | POA: Insufficient documentation

## 2015-05-18 DIAGNOSIS — G629 Polyneuropathy, unspecified: Secondary | ICD-10-CM | POA: Diagnosis not present

## 2015-05-18 DIAGNOSIS — M25511 Pain in right shoulder: Secondary | ICD-10-CM

## 2015-05-18 DIAGNOSIS — F909 Attention-deficit hyperactivity disorder, unspecified type: Secondary | ICD-10-CM | POA: Diagnosis not present

## 2015-05-18 DIAGNOSIS — M25561 Pain in right knee: Secondary | ICD-10-CM | POA: Diagnosis not present

## 2015-05-18 MED ORDER — AMPHETAMINE-DEXTROAMPHETAMINE 30 MG PO TABS: 30.0000 mg | ORAL_TABLET | Freq: Two times a day (BID) | ORAL | Status: DC

## 2015-05-18 MED ORDER — PREDNISONE 10 MG (21) PO TBPK: ORAL_TABLET | ORAL | Status: DC

## 2015-05-18 MED ORDER — OXYCODONE-ACETAMINOPHEN 5-325 MG PO TABS: 1.0000 | ORAL_TABLET | Freq: Three times a day (TID) | ORAL | Status: DC | PRN

## 2015-05-18 MED ORDER — TRIAMCINOLONE ACETONIDE 0.1 % EX CREA
TOPICAL_CREAM | CUTANEOUS | Status: DC
Start: 2015-05-18 — End: 2016-01-07

## 2015-05-18 MED ORDER — TRAMADOL HCL 50 MG PO TABS: 50.0000 mg | ORAL_TABLET | Freq: Four times a day (QID) | ORAL | Status: DC
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
## 2015-05-18 NOTE — Assessment & Plan Note (Signed)
Right shoulder pain of undetermined origin with differentials including sprain/strain/muscle spasm. No trauma that he currently makes diagnosis difficult as well as generalized shoulder pain. Unlikely bony pathology. Start prednisone. Treat conservatively with ice and home exercise therapy. Follow-up in 3 weeks or sooner after completion of prednisone to determine any focalized pain that may appear.

## 2015-05-18 NOTE — Assessment & Plan Note (Signed)
ADD appears stable with current regimen and denies adverse side effects. He is tachycardic today, however this may be related to the current pain shoulder. There is some concern about medication usage not as prescribed. With chronic controlled substance database reviewed with no irregularities. Continue current dosage of Adderall. Urine drug screen obtained.

## 2015-05-18 NOTE — Assessment & Plan Note (Signed)
Continues to experience chronic knee pain of undetermined origin with significant previous workup. Continue current dosage of oxycodone-acetaminophen and tramadol as needed for discomfort. Encouraged continued icing, home exercise therapy and bracing as needed. Refer to pain management for further evaluation.

## 2015-05-18 NOTE — Patient Instructions (Signed)
Thank you for choosing [facility].  Summary/Instructions:  Please start the prednisone for your shoulder.  Ice 2-3 times per day and as needed.   Continue to take your medications as prescribed.   We have sent a referral to pain medicine.  Your prescription(s) have been submitted to your pharmacy or been printed and provided for you. Please take as directed and contact our office if you believe you are having problem(s) with the medication(s) or have any questions.  If your symptoms worsen or fail to improve, please contact our office for further instruction, or in case of emergency go directly to the emergency room at the closest medical facility.

## 2015-05-18 NOTE — Progress Notes (Signed)
Pre visit review using our clinic review tool, if applicable. No additional management support is needed unless otherwise documented below in the visit note. 

## 2015-05-19 DIAGNOSIS — Z79899 Other long term (current) drug therapy: Secondary | ICD-10-CM | POA: Diagnosis not present

## 2015-05-19 DIAGNOSIS — Z79891 Long term (current) use of opiate analgesic: Secondary | ICD-10-CM | POA: Diagnosis not present

## 2015-05-20 ENCOUNTER — Telehealth: Payer: Self-pay

## 2015-05-20 DIAGNOSIS — R5383 Other fatigue: Secondary | ICD-10-CM

## 2015-05-20 NOTE — Telephone Encounter (Signed)
Patient states he thought he needed to come in tomorrow morning to have "some kind" of lab work done. I do not see any orders. Please advise or follow up. Thank you.

## 2015-05-20 NOTE — Telephone Encounter (Signed)
Please advise 

## 2015-05-20 NOTE — Telephone Encounter (Signed)
Testosterone order placed.

## 2015-05-20 NOTE — Telephone Encounter (Signed)
Pt aware.

## 2015-05-21 ENCOUNTER — Other Ambulatory Visit (INDEPENDENT_AMBULATORY_CARE_PROVIDER_SITE_OTHER): Payer: BLUE CROSS/BLUE SHIELD

## 2015-05-21 ENCOUNTER — Telehealth: Payer: Self-pay | Admitting: Family

## 2015-05-21 DIAGNOSIS — R5383 Other fatigue: Secondary | ICD-10-CM

## 2015-05-21 DIAGNOSIS — R7989 Other specified abnormal findings of blood chemistry: Secondary | ICD-10-CM

## 2015-05-21 LAB — TESTOSTERONE: Testosterone: 201.81 ng/dL — ABNORMAL LOW (ref 300.00–890.00)

## 2015-05-21 NOTE — Telephone Encounter (Signed)
Please inform patient that his testosterone is low. In order to make a diagnosis, this lab needs to be repeated at his convenience between the hours of 7:30-9:30 am. Orders have been placed and no fasting is necessary.

## 2015-05-25 NOTE — Telephone Encounter (Signed)
Pt aware of results 

## 2015-06-04 ENCOUNTER — Other Ambulatory Visit (INDEPENDENT_AMBULATORY_CARE_PROVIDER_SITE_OTHER): Payer: BLUE CROSS/BLUE SHIELD

## 2015-06-04 DIAGNOSIS — E291 Testicular hypofunction: Secondary | ICD-10-CM | POA: Diagnosis not present

## 2015-06-04 DIAGNOSIS — R7989 Other specified abnormal findings of blood chemistry: Secondary | ICD-10-CM

## 2015-06-04 LAB — LUTEINIZING HORMONE: LH: 6.01 m[IU]/mL (ref 1.50–9.30)

## 2015-06-04 LAB — TESTOSTERONE: Testosterone: 179.08 ng/dL — ABNORMAL LOW (ref 300.00–890.00)

## 2015-06-04 LAB — FOLLICLE STIMULATING HORMONE: FSH: 5.7 m[IU]/mL (ref 1.4–18.1)

## 2015-06-08 ENCOUNTER — Telehealth: Payer: Self-pay | Admitting: Family

## 2015-06-08 NOTE — Telephone Encounter (Signed)
Please inform patient that his testosterone is low and therefore qualifies for potential testosterone therapy. Please have him make an office visit to follow up if interested in pursuing treatment.

## 2015-06-09 NOTE — Telephone Encounter (Signed)
Pt aware of results. Has an appointment set up .

## 2015-06-13 ENCOUNTER — Other Ambulatory Visit: Payer: Self-pay | Admitting: Family

## 2015-06-17 ENCOUNTER — Encounter: Payer: Self-pay | Admitting: Family

## 2015-06-17 ENCOUNTER — Ambulatory Visit (INDEPENDENT_AMBULATORY_CARE_PROVIDER_SITE_OTHER): Payer: BLUE CROSS/BLUE SHIELD | Admitting: Family

## 2015-06-17 VITALS — BP 122/80 | HR 126 | Temp 98.3°F | Resp 16 | Ht 71.0 in | Wt 204.0 lb

## 2015-06-17 DIAGNOSIS — G629 Polyneuropathy, unspecified: Secondary | ICD-10-CM | POA: Diagnosis not present

## 2015-06-17 DIAGNOSIS — F988 Other specified behavioral and emotional disorders with onset usually occurring in childhood and adolescence: Secondary | ICD-10-CM

## 2015-06-17 DIAGNOSIS — E291 Testicular hypofunction: Secondary | ICD-10-CM | POA: Diagnosis not present

## 2015-06-17 DIAGNOSIS — F909 Attention-deficit hyperactivity disorder, unspecified type: Secondary | ICD-10-CM

## 2015-06-17 DIAGNOSIS — M25561 Pain in right knee: Secondary | ICD-10-CM | POA: Diagnosis not present

## 2015-06-17 MED ORDER — OXYCODONE-ACETAMINOPHEN 5-325 MG PO TABS: 1.0000 | ORAL_TABLET | Freq: Three times a day (TID) | ORAL | Status: DC | PRN

## 2015-06-17 MED ORDER — AMPHETAMINE-DEXTROAMPHETAMINE 30 MG PO TABS: 30.0000 mg | ORAL_TABLET | Freq: Two times a day (BID) | ORAL | Status: DC

## 2015-06-17 MED ORDER — TRAMADOL HCL 50 MG PO TABS: 50.0000 mg | ORAL_TABLET | Freq: Four times a day (QID) | ORAL | Status: DC

## 2015-06-17 NOTE — Patient Instructions (Addendum)
Thank you for choosing [facility].  Summary/Instructions:  Please continue to take your medication as prescribed.   Your prescription(s) have been submitted to your pharmacy or been printed and provided for you. Please take as directed and contact our office if you believe you are having problem(s) with the medication(s) or have any questions.  If your symptoms worsen or fail to improve, please contact our office for further instruction, or in case of emergency go directly to the emergency room at the closest medical facility.    Testosterone Testosterone is a hormone made by the male's testicles and by the adrenal glands, which are a pair of glands on top of the kidneys. Starting at puberty, testosterone stimulates the development of secondary sex characteristics. This includes a deeper voice, growth of muscles and body hair, and penis enlargement.  Females also produce testosterone in both the adrenal glands and ovaries. A male's body converts testosterone into estradiol, the main male sex hormone. An abnormal level of testosterone can cause health issues in both males and females. You may have this test if your health care provider suspects that an abnormal testosterone level is causing or contributing to other health problems. In males, symptoms of an abnormal testosterone level include:  Infertility.  Erectile dysfunction.  Delayed puberty or premature puberty. In females, symptoms of an abnormally high testosterone level include:  Infertility.  Polycystic ovarian syndrome (PCOS).  Developing masculine features (virilization). This test requires a blood sample taken from a vein in your arm or hand. The sample for this test is usually collected in the morning. The amount of testosterone in your blood is highest at that time. RESULTS It is your responsibility to obtain your test results. Ask the lab or department performing the test when and how you will get your  results. Contact your health care provider to discuss any questions you have about your results.  The result of a blood test for testosterone will be given as a range of values. A testosterone level that is outside the normal range may indicate a health problem. Testosterone is measured in nanograms per deciliter (ng/dL). Range of Normal Values Ranges for normal values may vary among different labs and hospitals. You should always check with your health care provider after having lab work or other tests done to discuss whether your values are considered within normal limits. Normal levels of total testosterone are as follows:  Male:  7 months to 44 years old: less than 30 ng/dL.  7110-44 years old: less than 300 ng/dL.  8714-945 years old: 170-540 ng/dL.  4316-20138 years old: 250-910 ng/dL.  44 years old and over: 280-1,080 ng/dL.  Male:  7 months to 44 years old: less than 30 ng/dL.  2610-44 years old: less than 40 ng/dL.  1314-44 years old: less than 60 ng/dL.  5216-58138 years old: less than 70 ng/dL.  44 years old and over: less than 70 ng/dL. Meaning of Results Outside Normal Value Ranges A testosterone level that is too low or too high can indicate a number of health problems. In males:  A high testosterone level can occur if you:  Have certain types of tumors.  Have an overactive thyroid gland (hyperthyroidism).  Use anabolic steroids.  Are starting puberty early (precocious puberty).  Have an inherited disorder that affects the adrenal glands (congenital adrenal hyperplasia).  A low testosterone level can occur if you:  Have certain genetic diseases.  Have had certain viral infections, such as mumps.  Have pituitary  disease.  Have had an injury to the testicles.  Are an alcoholic. In females:  A high testosterone level can occur if you have:  Certain types of tumors.  An inherited disorder that affects certain cells in the adrenal glands (congenital adrenocortical  hyperplasia).  PCOS.  A low testosterone level does not cause health problems. Discuss the results of your testosterone test with your health care provider. Your health care provider will use the results of this test and other tests to make a diagnosis.   This information is not intended to replace advice given to you by your health care provider. Make sure you discuss any questions you have with your health care provider.   Document Released: 01/28/2004 Document Revised: 01/31/2014 Document Reviewed: 05/08/2013 Elsevier Interactive Patient Education [employer]

## 2015-06-17 NOTE — Assessment & Plan Note (Signed)
Previous blood work with multiple low testosterone levels with normal FSH and LH consistent with secondary male hypogonadism. Discusses risks, side effects and proper medication usage. Patient will review information and decide on whether to pursue treatment at this time. Continue to monitor pending patient decision.

## 2015-06-17 NOTE — Assessment & Plan Note (Signed)
Knee and chest pain currently stable with oxycodone-acetaminophen. Continues to attempt non-pharmacological treatments. No adverse side effects of medication regimen or constipation. Continue current dosage of oxycodone-acetaminophen. Will be due for UDS at next office visit.

## 2015-06-17 NOTE — Progress Notes (Signed)
Pre visit review using our clinic review tool, if applicable. No additional management support is needed unless otherwise documented below in the visit note. 

## 2015-06-17 NOTE — Progress Notes (Signed)
Subjective:    Patient ID: Billy Bray, male    DOB: December 02, 1970, 45 y.o.   MRN: [id number]  Chief Complaint  Patient presents with  . Follow-up    needs refills of tramadol, adderall, and percocet, also follow up on testosterone    HPI:  Billy Sealodd M Phagan is a 45 y.o. male who  has a past medical history of Asthma; Arthritis; Depression; Allergy; and Kidney stones. and presents today for an office follow up.  1.) ADD - currently maintained on Adderall. Reports taking the medication as prescribed and denies adverse side effects. Sleeping approximate 6-8 hours per night. Denies any decreased appetite or significant weight changes. Denies chest pain, shortness of breath, or heart palpitations.  Wt Readings from Last 3 Encounters:  06/17/15 204 lb (92.534 kg)  05/18/15 206 lb (93.441 kg)  02/20/15 216 lb (97.977 kg)    2.) Chronic knee pain/chest wall pain - continues to experience associated symptom is pain of unexplained origin currently managed on gabapentin, oxycodone-acetaminophen and tramadol. Also continues to experience chest pain associated with previous chest tube placement. Reports the pain is reasonably controlled with the current regimen and no adverse side effects or constipation.  3.) Low testosterone - noted to have 2 readings of low testosterone between the hours of 8 AM and 10 AM with normal FSH/LH indicating secondary hypogonadism. Continues to experience the associated symptoms of fatigue and is interested in testosterone replacement therapy.   Allergies  Allergen Reactions  . Amoxicillin Hives     Current Outpatient Prescriptions on File Prior to Visit  Medication Sig Dispense Refill  . albuterol (PROVENTIL HFA;VENTOLIN HFA) 108 (90 Base) MCG/ACT inhaler Inhale 1-2 puffs into the lungs every 6 (six) hours as needed for wheezing or shortness of breath. 1 Inhaler 2  . albuterol (PROVENTIL) (2.5 MG/3ML) 0.083% nebulizer solution Take 3 mLs (2.5 mg total) by nebulization  every 6 (six) hours as needed for wheezing or shortness of breath. 150 mL 1  . gabapentin (NEURONTIN) 300 MG capsule TAKE 1 CAPSULE (300 MG TOTAL) BY MOUTH 3 (THREE) TIMES DAILY. 90 capsule 1  . predniSONE (STERAPRED UNI-PAK 21 TAB) 10 MG (21) TBPK tablet Take 6 tablets x 1 day, 5 tablets x 1 day, 4 tablets x 1 day, 3 tablets x 1 day, 2 tablets x 1 day, 1 tablet x 1 day 21 tablet 0  . triamcinolone cream (KENALOG) 0.1 % APPLY TO AFFECTED AREA TWICE A DAY FOR 2 WEEKS 60 g 1   No current facility-administered medications on file prior to visit.     No past surgical history on file.   Past Medical History  Diagnosis Date  . Asthma   . Arthritis   . Depression   . Allergy   . Kidney stones      Review of Systems  Constitutional: Positive for fatigue. Negative for diaphoresis, appetite change and unexpected weight change.  Respiratory: Negative for chest tightness and shortness of breath.   Cardiovascular: Negative for chest pain, palpitations and leg swelling.  Neurological: Negative for weakness, numbness and headaches.  Psychiatric/Behavioral: Negative for sleep disturbance and decreased concentration. The patient is not nervous/anxious.       Objective:    BP 122/80 mmHg  Pulse 126  Temp(Src) 98.3 F (36.8 C) (Oral)  Resp 16  Ht 5\' 11"  (1.803 m)  Wt 204 lb (92.534 kg)  BMI 28.46 kg/m2  SpO2 97% Nursing note and vital signs reviewed.  Physical Exam  Constitutional: He  is oriented to person, place, and time. He appears well-developed and well-nourished. No distress.  Cardiovascular: Normal rate, regular rhythm, normal heart sounds and intact distal pulses.   Pulmonary/Chest: Effort normal and breath sounds normal.  Neurological: He is alert and oriented to person, place, and time.  Skin: Skin is warm and dry.  Psychiatric: He has a normal mood and affect. His behavior is normal. Judgment and thought content normal.       Assessment & Plan:   Problem List Items  Addressed This Visit      Endocrine   Secondary male hypogonadism    Previous blood work with multiple low testosterone levels with normal FSH and LH consistent with secondary male hypogonadism. Discusses risks, side effects and proper medication usage. Patient will review information and decide on whether to pursue treatment at this time. Continue to monitor pending patient decision.        Nervous and Auditory   Neuropathy (HCC)   Relevant Medications   traMADol (ULTRAM) 50 MG tablet     Other   ADD (attention deficit disorder)    ADD appears stable with current regimen and no adverse side effects. Eating and sleeping well with no cardiac symptoms. Continue current dosage of Adderall. [place] Controlled Substance Database reviewed with no irregularities.      Relevant Medications   amphetamine-dextroamphetamine (ADDERALL) 30 MG tablet   Knee pain - Primary    Knee and chest pain currently stable with oxycodone-acetaminophen. Continues to attempt non-pharmacological treatments. No adverse side effects of medication regimen or constipation. Continue current dosage of oxycodone-acetaminophen. Will be due for UDS at next office visit.       Relevant Medications   oxyCODONE-acetaminophen (ROXICET) 5-325 MG tablet       I am having Mr. Mchugh maintain his albuterol, albuterol, predniSONE, triamcinolone cream, gabapentin, oxyCODONE-acetaminophen, traMADol, and amphetamine-dextroamphetamine.   Meds ordered this encounter  Medications  . oxyCODONE-acetaminophen (ROXICET) 5-325 MG tablet    Sig: Take 1 tablet by mouth every 8 (eight) hours as needed for severe pain.    Dispense:  90 tablet    Refill:  0    Do not fill until 06/20/15    Order Specific Question:  Supervising Provider    Answer:  Hillard Danker A [[id number]]  . traMADol (ULTRAM) 50 MG tablet    Sig: Take 1 tablet (50 mg total) by mouth 4 (four) times daily.    Dispense:  120 tablet    Refill:  0    Order Specific  Question:  Supervising Provider    Answer:  Hillard Danker A [[id number]]  . amphetamine-dextroamphetamine (ADDERALL) 30 MG tablet    Sig: Take 1 tablet by mouth 2 (two) times daily.    Dispense:  60 tablet    Refill:  0    Do not fill until 06/20/15    Order Specific Question:  Supervising Provider    Answer:  Hillard Danker A [[id number]]     Follow-up: No Follow-up on file.  Jeanine Luz, FNP

## 2015-06-17 NOTE — Assessment & Plan Note (Signed)
ADD appears stable with current regimen and no adverse side effects. Eating and sleeping well with no cardiac symptoms. Continue current dosage of Adderall. [place] Controlled Substance Database reviewed with no irregularities.

## 2015-07-13 ENCOUNTER — Telehealth: Payer: Self-pay | Admitting: Family

## 2015-07-13 NOTE — Telephone Encounter (Signed)
Patient called to advise that he is ready to begin testosterone replacement as you discussed,

## 2015-07-20 ENCOUNTER — Telehealth: Payer: Self-pay | Admitting: *Deleted

## 2015-07-20 DIAGNOSIS — M25561 Pain in right knee: Secondary | ICD-10-CM

## 2015-07-20 DIAGNOSIS — G629 Polyneuropathy, unspecified: Secondary | ICD-10-CM

## 2015-07-20 DIAGNOSIS — Z5181 Encounter for therapeutic drug level monitoring: Secondary | ICD-10-CM

## 2015-07-20 DIAGNOSIS — F988 Other specified behavioral and emotional disorders with onset usually occurring in childhood and adolescence: Secondary | ICD-10-CM

## 2015-07-20 MED ORDER — TESTOSTERONE CYPIONATE 100 MG/ML IM SOLN: 100.0000 mg | INTRAMUSCULAR | Status: DC

## 2015-07-20 MED ORDER — TRAMADOL HCL 50 MG PO TABS: 50.0000 mg | ORAL_TABLET | Freq: Four times a day (QID) | ORAL | Status: DC

## 2015-07-20 MED ORDER — OXYCODONE-ACETAMINOPHEN 5-325 MG PO TABS: 1.0000 | ORAL_TABLET | Freq: Three times a day (TID) | ORAL | Status: DC | PRN

## 2015-07-20 MED ORDER — AMPHETAMINE-DEXTROAMPHETAMINE 30 MG PO TABS: 30.0000 mg | ORAL_TABLET | Freq: Two times a day (BID) | ORAL | Status: DC

## 2015-07-20 NOTE — Telephone Encounter (Signed)
Notified pt w/Greg response. Pt states he would like injections. Also informed rx's are ready for pick-up...Raechel Chute/lmb

## 2015-07-20 NOTE — Telephone Encounter (Signed)
Left msg on triage requesting refills on his Percocet, Adderrall, and Tramadol....Raechel Chute/lmb

## 2015-07-20 NOTE — Telephone Encounter (Signed)
Medications printed for pick up. Can you please check if he wanted to do injections or gel for his testosterone therapy.

## 2015-07-20 NOTE — Telephone Encounter (Signed)
Testosterone printed to be faxed. Please have him stop by the lab at his convenience for a white/red blood cell check prior to starting. Follow up will be needed 3 months after initiation.

## 2015-07-21 ENCOUNTER — Telehealth: Payer: Self-pay

## 2015-07-21 NOTE — Telephone Encounter (Signed)
Notified pt w/Greg response. Rx has been faxed to [facility].../lmb

## 2015-07-21 NOTE — Telephone Encounter (Signed)
PA initiated via CoverMyMeds key [id number]

## 2015-07-23 NOTE — Telephone Encounter (Signed)
APPROVED 07/22/2015 - 01/23/2038

## 2015-08-10 ENCOUNTER — Telehealth: Payer: Self-pay | Admitting: Emergency Medicine

## 2015-08-10 MED ORDER — GABAPENTIN 300 MG PO CAPS: ORAL_CAPSULE | ORAL | Status: DC

## 2015-08-10 NOTE — Telephone Encounter (Signed)
Refill request from [facility] for Gabapentin, last refill 5/22 #90 with 1 refill, last OV 5/24

## 2015-08-10 NOTE — Telephone Encounter (Signed)
Done

## 2015-08-17 ENCOUNTER — Telehealth: Payer: Self-pay | Admitting: Emergency Medicine

## 2015-08-17 DIAGNOSIS — G629 Polyneuropathy, unspecified: Secondary | ICD-10-CM

## 2015-08-17 DIAGNOSIS — M25561 Pain in right knee: Secondary | ICD-10-CM

## 2015-08-17 MED ORDER — OXYCODONE-ACETAMINOPHEN 5-325 MG PO TABS: 1.0000 | ORAL_TABLET | Freq: Three times a day (TID) | ORAL | 0 refills | Status: DC | PRN

## 2015-08-17 MED ORDER — TRAMADOL HCL 50 MG PO TABS: 50.0000 mg | ORAL_TABLET | Freq: Four times a day (QID) | ORAL | 0 refills | Status: DC

## 2015-08-17 NOTE — Telephone Encounter (Signed)
Pt would like refills on traMADol (ULTRAM) 50 MG tablet,   amphetamine-dextroamphetamine (ADDERALL) 30 MG tablet, testosterone cypionate (DEPOTESTOTERONE CYPIONATE) 100 MG/ML injection and oxyCODONE-acetaminophen (ROXICET) 5-325 MG tablet. Please follow up thanks.

## 2015-08-17 NOTE — Telephone Encounter (Signed)
Medications refilled. Will need follow up for additional. The testosterone is not due for refill as it was just filled and he should have 9 ml remaining.

## 2015-08-17 NOTE — Telephone Encounter (Signed)
Last refill of all requested medication was 07/20/15. Please advise

## 2015-08-18 ENCOUNTER — Other Ambulatory Visit: Payer: Self-pay | Admitting: Family

## 2015-08-18 DIAGNOSIS — F988 Other specified behavioral and emotional disorders with onset usually occurring in childhood and adolescence: Secondary | ICD-10-CM

## 2015-08-18 MED ORDER — AMPHETAMINE-DEXTROAMPHETAMINE 30 MG PO TABS: 30.0000 mg | ORAL_TABLET | Freq: Two times a day (BID) | ORAL | 0 refills | Status: DC

## 2015-08-18 NOTE — Telephone Encounter (Signed)
Pt aware that medication is ready for pick up

## 2015-09-14 ENCOUNTER — Ambulatory Visit (INDEPENDENT_AMBULATORY_CARE_PROVIDER_SITE_OTHER): Payer: BLUE CROSS/BLUE SHIELD | Admitting: Family

## 2015-09-14 ENCOUNTER — Encounter: Payer: Self-pay | Admitting: Family

## 2015-09-14 VITALS — BP 150/94 | HR 102 | Resp 16 | Ht 71.0 in | Wt 203.0 lb

## 2015-09-14 DIAGNOSIS — F988 Other specified behavioral and emotional disorders with onset usually occurring in childhood and adolescence: Secondary | ICD-10-CM

## 2015-09-14 DIAGNOSIS — F909 Attention-deficit hyperactivity disorder, unspecified type: Secondary | ICD-10-CM | POA: Diagnosis not present

## 2015-09-14 DIAGNOSIS — M25561 Pain in right knee: Secondary | ICD-10-CM | POA: Diagnosis not present

## 2015-09-14 DIAGNOSIS — J45909 Unspecified asthma, uncomplicated: Secondary | ICD-10-CM | POA: Insufficient documentation

## 2015-09-14 DIAGNOSIS — G629 Polyneuropathy, unspecified: Secondary | ICD-10-CM

## 2015-09-14 DIAGNOSIS — J4530 Mild persistent asthma, uncomplicated: Secondary | ICD-10-CM | POA: Diagnosis not present

## 2015-09-14 MED ORDER — AMPHETAMINE-DEXTROAMPHETAMINE 30 MG PO TABS: 30.0000 mg | ORAL_TABLET | Freq: Two times a day (BID) | ORAL | 0 refills | Status: DC

## 2015-09-14 MED ORDER — TRAMADOL HCL 50 MG PO TABS: 50.0000 mg | ORAL_TABLET | Freq: Four times a day (QID) | ORAL | 0 refills | Status: DC | PRN

## 2015-09-14 MED ORDER — ALBUTEROL SULFATE 108 (90 BASE) MCG/ACT IN AEPB: 2.0000 | INHALATION_SPRAY | RESPIRATORY_TRACT | 2 refills | Status: DC | PRN

## 2015-09-14 NOTE — Assessment & Plan Note (Signed)
Mild persistent asthma with occasional exacerbation secondary to new work environment. Continue current dosage of albuterol as needed. Consider advancing 1 step if symptoms continue to require additional use of beta 2 agonists.

## 2015-09-14 NOTE — Patient Instructions (Signed)
Thank you for choosing [facility].  Summary/Instructions:  Please continue take your medications as prescribed.  Increase the Tramadol.  Start the Respiclick. If you continue to need your inhaler frequently please let me know.   Your prescription(s) have been submitted to your pharmacy or been printed and provided for you. Please take as directed and contact our office if you believe you are having problem(s) with the medication(s) or have any questions.  If your symptoms worsen or fail to improve, please contact our office for further instruction, or in case of emergency go directly to the emergency room at the closest medical facility.

## 2015-09-14 NOTE — Assessment & Plan Note (Signed)
Attention deficit disorder well controlled with current regimen and no adverse side effects. Does have some difficulty sleeping although not related to medication. Eating with no significant changes in appetite or weight. No cardiac symptoms. Continue current dosage of Adderall. [place] controlled substance database reviewed with no irregularities.

## 2015-09-14 NOTE — Progress Notes (Signed)
Subjective:    Patient ID: Billy Bray, male    DOB: 1970-02-16, 45 y.o.   MRN: [id number]  Chief Complaint  Patient presents with  . Medication Refill    HPI:  Billy Bray is a 45 y.o. male who  has a past medical history of Allergy; Arthritis; Asthma; Depression; and Kidney stones. and presents today for a follow up office visit.  1.) Chronic pain - Continues to experience the associated symptom of pain located in his right knee that is managed with gabapentin, oxycodone and Tramadol. Reports taking the medication as prescribed and denies adverse reactions. No constipation. Would like to discontinue the oxycodone and increase the Tramadol. He does continue to remain functional with the current medication regimen.   2.) Asthma - New job area has resulted in an increase in his asthma symptoms and having to use his albuterol inhaler more. Currently maintained on Proventil and takes the medication as prescribed and denies adverse side effects. Denies night time awakenings.   3.) ADD - Currently maintained on adderall. Reports taking the medication as prescribed and denies adverse side effects. Sleeping diffficulties not related to his Adderall. No weight loss or significant changes in appeptite. Attention is well controlled with the Adderall.  Allergies  Allergen Reactions  . Amoxicillin Hives     Current Outpatient Prescriptions on File Prior to Visit  Medication Sig Dispense Refill  . albuterol (PROVENTIL HFA;VENTOLIN HFA) 108 (90 Base) MCG/ACT inhaler Inhale 1-2 puffs into the lungs every 6 (six) hours as needed for wheezing or shortness of breath. 1 Inhaler 2  . albuterol (PROVENTIL) (2.5 MG/3ML) 0.083% nebulizer solution Take 3 mLs (2.5 mg total) by nebulization every 6 (six) hours as needed for wheezing or shortness of breath. 150 mL 1  . gabapentin (NEURONTIN) 300 MG capsule TAKE 1 CAPSULE (300 MG TOTAL) BY MOUTH 3 (THREE) TIMES DAILY. 90 capsule 1  . testosterone cypionate  (DEPOTESTOTERONE CYPIONATE) 100 MG/ML injection Inject 1 mL (100 mg total) into the muscle every 14 (fourteen) days. For IM use only 10 mL 0  . triamcinolone cream (KENALOG) 0.1 % APPLY TO AFFECTED AREA TWICE A DAY FOR 2 WEEKS 60 g 1   No current facility-administered medications on file prior to visit.     Past Medical History:  Diagnosis Date  . Allergy   . Arthritis   . Asthma   . Depression   . Kidney stones      Review of Systems  Constitutional: Negative for appetite change, diaphoresis, fever and unexpected weight change.  Respiratory: Negative for chest tightness, shortness of breath and wheezing.   Cardiovascular: Negative for chest pain, palpitations and leg swelling.  Musculoskeletal:       Positive for right knee pain.  Neurological: Negative for headaches.  Psychiatric/Behavioral: Negative for decreased concentration and sleep disturbance. The patient is not nervous/anxious.       Objective:    BP (!) 150/94 (BP Location: Left Arm, Patient Position: Sitting, Cuff Size: Normal)   Pulse (!) 102   Resp 16   Ht 5\' 11"  (1.803 m)   Wt 203 lb (92.1 kg)   SpO2 98%   BMI 28.31 kg/m  Nursing note and vital signs reviewed.  Physical Exam  Constitutional: He is oriented to person, place, and time. He appears well-developed and well-nourished. No distress.  Cardiovascular: Normal rate, regular rhythm, normal heart sounds and intact distal pulses.   Pulmonary/Chest: Effort normal and breath sounds normal. He has no wheezes.  He has no rales. He exhibits no tenderness.  Neurological: He is alert and oriented to person, place, and time.  Skin: Skin is warm and dry.  Psychiatric: He has a normal mood and affect. His behavior is normal. Judgment and thought content normal.       Assessment & Plan:   Problem List Items Addressed This Visit      Respiratory   Asthma    Mild persistent asthma with occasional exacerbation secondary to new work environment. Continue current  dosage of albuterol as needed. Consider advancing 1 step if symptoms continue to require additional use of beta 2 agonists.      Relevant Medications   Albuterol Sulfate (PROAIR RESPICLICK) 108 (90 Base) MCG/ACT AEPB     Nervous and Auditory   Neuropathy (HCC)   Relevant Medications   traMADol (ULTRAM) 50 MG tablet     Other   ADD (attention deficit disorder) - Primary    Attention deficit disorder well controlled with current regimen and no adverse side effects. Does have some difficulty sleeping although not related to medication. Eating with no significant changes in appetite or weight. No cardiac symptoms. Continue current dosage of Adderall. [place] controlled substance database reviewed with no irregularities.      Relevant Medications   amphetamine-dextroamphetamine (ADDERALL) 30 MG tablet   Knee pain    Continues to experience chronic knee pain of undetermined origin and currently maintained with oxycodone and tramadol. Per patient request, discontinue oxycodone. Increase tramadol. Remains functional with current medication regimen able to work. [place] controlled substance database reviewed with no irregularities. Continue to monitor.      Relevant Medications   traMADol (ULTRAM) 50 MG tablet    Other Visit Diagnoses   None.      I have discontinued Billy Bray's oxyCODONE-acetaminophen. I have also changed his traMADol. Additionally, I am having him start on Albuterol Sulfate. Lastly, I am having him maintain his albuterol, albuterol, triamcinolone cream, testosterone cypionate, gabapentin, and amphetamine-dextroamphetamine.   Meds ordered this encounter  Medications  . traMADol (ULTRAM) 50 MG tablet    Sig: Take 1-2 tablets (50-100 mg total) by mouth 4 (four) times daily as needed for severe pain.    Dispense:  240 tablet    Refill:  0    Order Specific Question:   Supervising Provider    Answer:   Hillard DankerRAWFORD, ELIZABETH A [[id number]]  . Albuterol Sulfate  (PROAIR RESPICLICK) 108 (90 Base) MCG/ACT AEPB    Sig: Inhale 2 puffs into the lungs every 4 (four) hours as needed.    Dispense:  1 each    Refill:  2    Order Specific Question:   Supervising Provider    Answer:   Hillard DankerRAWFORD, ELIZABETH A [[id number]]  . amphetamine-dextroamphetamine (ADDERALL) 30 MG tablet    Sig: Take 1 tablet by mouth 2 (two) times daily.    Dispense:  60 tablet    Refill:  0    Order Specific Question:   Supervising Provider    Answer:   Hillard DankerRAWFORD, ELIZABETH A [[id number]]     Follow-up: Return if symptoms worsen or fail to improve.  Jeanine Luzalone, Regan Llorente, FNP

## 2015-09-14 NOTE — Assessment & Plan Note (Signed)
Continues to experience chronic knee pain of undetermined origin and currently maintained with oxycodone and tramadol. Per patient request, discontinue oxycodone. Increase tramadol. Remains functional with current medication regimen able to work. [place] controlled substance database reviewed with no irregularities. Continue to monitor.

## 2015-09-30 ENCOUNTER — Other Ambulatory Visit: Payer: Self-pay | Admitting: Family

## 2015-10-14 ENCOUNTER — Telehealth: Payer: Self-pay | Admitting: *Deleted

## 2015-10-14 DIAGNOSIS — G629 Polyneuropathy, unspecified: Secondary | ICD-10-CM

## 2015-10-14 DIAGNOSIS — M25561 Pain in right knee: Secondary | ICD-10-CM

## 2015-10-14 DIAGNOSIS — F988 Other specified behavioral and emotional disorders with onset usually occurring in childhood and adolescence: Secondary | ICD-10-CM

## 2015-10-14 NOTE — Telephone Encounter (Signed)
Rec'd call pt requesting refill on Adderrall, Gabapentin, and Tramadol...Raechel Chute/lmb

## 2015-10-15 MED ORDER — TRAMADOL HCL 50 MG PO TABS: 50.0000 mg | ORAL_TABLET | Freq: Four times a day (QID) | ORAL | 0 refills | Status: DC | PRN

## 2015-10-15 MED ORDER — AMPHETAMINE-DEXTROAMPHETAMINE 30 MG PO TABS: 30.0000 mg | ORAL_TABLET | Freq: Two times a day (BID) | ORAL | 0 refills | Status: DC

## 2015-10-15 MED ORDER — GABAPENTIN 300 MG PO CAPS: ORAL_CAPSULE | ORAL | 1 refills | Status: DC

## 2015-10-15 NOTE — Telephone Encounter (Signed)
Notified pt rx's ready for pick-up../lmb 

## 2015-10-15 NOTE — Telephone Encounter (Signed)
Medications refilled

## 2015-11-11 ENCOUNTER — Telehealth: Payer: Self-pay | Admitting: *Deleted

## 2015-11-11 DIAGNOSIS — M25561 Pain in right knee: Secondary | ICD-10-CM

## 2015-11-11 DIAGNOSIS — G629 Polyneuropathy, unspecified: Secondary | ICD-10-CM

## 2015-11-11 DIAGNOSIS — G8929 Other chronic pain: Secondary | ICD-10-CM

## 2015-11-11 DIAGNOSIS — F988 Other specified behavioral and emotional disorders with onset usually occurring in childhood and adolescence: Secondary | ICD-10-CM

## 2015-11-11 MED ORDER — AMPHETAMINE-DEXTROAMPHETAMINE 30 MG PO TABS: 30.0000 mg | ORAL_TABLET | Freq: Two times a day (BID) | ORAL | 0 refills | Status: DC

## 2015-11-11 MED ORDER — TRAMADOL HCL 50 MG PO TABS: 50.0000 mg | ORAL_TABLET | Freq: Four times a day (QID) | ORAL | 0 refills | Status: DC | PRN

## 2015-11-11 NOTE — Telephone Encounter (Signed)
Rec'd call pt is requesting refill on his Adderral & Tramadol...Raechel Chute/lmb

## 2015-11-11 NOTE — Telephone Encounter (Signed)
Medication refilled

## 2015-11-11 NOTE — Telephone Encounter (Signed)
Notified pt rx ready for pick-up.../lmb 

## 2015-11-20 DIAGNOSIS — R42 Dizziness and giddiness: Secondary | ICD-10-CM | POA: Diagnosis not present

## 2015-11-20 DIAGNOSIS — R55 Syncope and collapse: Secondary | ICD-10-CM | POA: Diagnosis not present

## 2015-11-20 DIAGNOSIS — R404 Transient alteration of awareness: Secondary | ICD-10-CM | POA: Diagnosis not present

## 2015-12-07 ENCOUNTER — Telehealth: Payer: Self-pay | Admitting: *Deleted

## 2015-12-07 DIAGNOSIS — G629 Polyneuropathy, unspecified: Secondary | ICD-10-CM

## 2015-12-07 DIAGNOSIS — F988 Other specified behavioral and emotional disorders with onset usually occurring in childhood and adolescence: Secondary | ICD-10-CM

## 2015-12-07 DIAGNOSIS — B351 Tinea unguium: Secondary | ICD-10-CM

## 2015-12-07 DIAGNOSIS — M25561 Pain in right knee: Secondary | ICD-10-CM

## 2015-12-07 DIAGNOSIS — E291 Testicular hypofunction: Secondary | ICD-10-CM

## 2015-12-07 DIAGNOSIS — Z5181 Encounter for therapeutic drug level monitoring: Secondary | ICD-10-CM

## 2015-12-07 DIAGNOSIS — G8929 Other chronic pain: Secondary | ICD-10-CM

## 2015-12-07 NOTE — Telephone Encounter (Signed)
Left msg on triage requesting refill for testosterone, and also he states he have a nail fungus wanting to see if Tammy SoursGreg can rx something...Raechel Chute/lmb

## 2015-12-08 ENCOUNTER — Other Ambulatory Visit (INDEPENDENT_AMBULATORY_CARE_PROVIDER_SITE_OTHER): Payer: BLUE CROSS/BLUE SHIELD

## 2015-12-08 DIAGNOSIS — Z5181 Encounter for therapeutic drug level monitoring: Secondary | ICD-10-CM | POA: Diagnosis not present

## 2015-12-08 DIAGNOSIS — E291 Testicular hypofunction: Secondary | ICD-10-CM

## 2015-12-08 LAB — CBC
HCT: 41 % (ref 39.0–52.0)
Hemoglobin: 14.1 g/dL (ref 13.0–17.0)
MCHC: 34.5 g/dL (ref 30.0–36.0)
MCV: 90.1 fl (ref 78.0–100.0)
Platelets: 253 10*3/uL (ref 150.0–400.0)
RBC: 4.55 Mil/uL (ref 4.22–5.81)
RDW: 13.6 % (ref 11.5–15.5)
WBC: 7.1 10*3/uL (ref 4.0–10.5)

## 2015-12-08 LAB — PSA: PSA: 0.85 ng/mL (ref 0.10–4.00)

## 2015-12-08 LAB — TESTOSTERONE: Testosterone: 208.3 ng/dL — ABNORMAL LOW (ref 300.00–890.00)

## 2015-12-08 NOTE — Telephone Encounter (Signed)
Notified pt w/Greg response. He would like to see dermatologist he stated he has tried otc fungus but its not helping. Also would like to get refill on monthly Adderral & Tramadol...Raechel Chute/lmb

## 2015-12-08 NOTE — Telephone Encounter (Signed)
Needs a testosterone and CBC checked prior to refill to ensure continued safety of medication. For the nail fungus he can try OTC medications first if unsuccessful we can send him to dermatology. Orders have been placed.

## 2015-12-10 ENCOUNTER — Telehealth: Payer: Self-pay | Admitting: *Deleted

## 2015-12-10 DIAGNOSIS — F988 Other specified behavioral and emotional disorders with onset usually occurring in childhood and adolescence: Secondary | ICD-10-CM

## 2015-12-10 MED ORDER — AMPHETAMINE-DEXTROAMPHETAMINE 30 MG PO TABS: 30.0000 mg | ORAL_TABLET | Freq: Two times a day (BID) | ORAL | 0 refills | Status: DC

## 2015-12-10 MED ORDER — TRAMADOL HCL 50 MG PO TABS: 50.0000 mg | ORAL_TABLET | Freq: Four times a day (QID) | ORAL | 0 refills | Status: DC | PRN

## 2015-12-10 MED ORDER — TESTOSTERONE CYPIONATE 100 MG/ML IM SOLN: 100.0000 mg | INTRAMUSCULAR | 0 refills | Status: DC

## 2015-12-10 NOTE — Addendum Note (Signed)
Addended by: Jeanine LuzALONE, Reginna Sermeno D on: 12/10/2015 01:16 PM   Modules accepted: Orders

## 2015-12-10 NOTE — Telephone Encounter (Signed)
Notified pt rx's ready for pick-up../lmb 

## 2015-12-10 NOTE — Telephone Encounter (Signed)
Medications refilled and referral sent.

## 2015-12-10 NOTE — Telephone Encounter (Signed)
Left msg on triage requesting refills on Adderrall & Tramadol...Raechel Chute/lmb

## 2015-12-11 NOTE — Telephone Encounter (Signed)
Refilled yesterday. 

## 2016-01-04 ENCOUNTER — Telehealth: Payer: Self-pay | Admitting: Family

## 2016-01-04 DIAGNOSIS — F988 Other specified behavioral and emotional disorders with onset usually occurring in childhood and adolescence: Secondary | ICD-10-CM

## 2016-01-04 MED ORDER — TESTOSTERONE CYPIONATE 200 MG/ML IM SOLN: 200.0000 mg | INTRAMUSCULAR | 0 refills | Status: DC

## 2016-01-04 NOTE — Telephone Encounter (Signed)
Please advise due to previous lab work if pt needs to increase his testosterone

## 2016-01-04 NOTE — Telephone Encounter (Signed)
Please inform patient to increase testosterone to 200 mg every 2 weeks. A new prescription has been written. If he would like to inject 100 mg weekly until his current dosage is completed that would be fine.

## 2016-01-04 NOTE — Telephone Encounter (Signed)
Patient called in about he had got a call back about his labs and his medication maybe changing. But he has yet to hear anything else about it, if it changing or what he needs to do. Please follow up with patient. Thank you.

## 2016-01-05 NOTE — Telephone Encounter (Signed)
Pt aware. Is asking for a refill of tramadol and adderall. Please advise

## 2016-01-07 ENCOUNTER — Other Ambulatory Visit: Payer: Self-pay | Admitting: Family

## 2016-01-07 DIAGNOSIS — G629 Polyneuropathy, unspecified: Secondary | ICD-10-CM

## 2016-01-07 DIAGNOSIS — G8929 Other chronic pain: Secondary | ICD-10-CM

## 2016-01-07 DIAGNOSIS — M25561 Pain in right knee: Secondary | ICD-10-CM

## 2016-01-07 NOTE — Telephone Encounter (Signed)
Rec'd call pt requesting refills on his Adderral & Tramadol...Raechel Chute/lmb

## 2016-01-08 NOTE — Telephone Encounter (Signed)
Faxed

## 2016-01-08 NOTE — Telephone Encounter (Signed)
Patient called back in to check on this.  Looks like Tramadol was done today but not adderall.  Please call patient once both are ready.

## 2016-01-08 NOTE — Telephone Encounter (Signed)
Last refill was 12/10/15 of tramadol

## 2016-01-11 MED ORDER — AMPHETAMINE-DEXTROAMPHETAMINE 30 MG PO TABS: 30.0000 mg | ORAL_TABLET | Freq: Two times a day (BID) | ORAL | 0 refills | Status: DC

## 2016-01-11 NOTE — Telephone Encounter (Signed)
Medication refilled

## 2016-01-11 NOTE — Telephone Encounter (Signed)
Notified patient.  Will be here this evening for pickup.

## 2016-01-11 NOTE — Addendum Note (Signed)
Addended by: Jeanine LuzALONE, Alastair Hennes D on: 01/11/2016 08:24 AM   Modules accepted: Orders

## 2016-01-11 NOTE — Telephone Encounter (Addendum)
Patient has called back in regard.  Please follow up with patient in regard.

## 2016-01-26 ENCOUNTER — Telehealth: Payer: Self-pay | Admitting: *Deleted

## 2016-01-26 NOTE — Telephone Encounter (Signed)
Pt states he would like to get his Gabapentin change to a 3 month supply will be a lot cheaper...Raechel Chute/LMB

## 2016-01-27 MED ORDER — GABAPENTIN 300 MG PO CAPS: ORAL_CAPSULE | ORAL | 0 refills | Status: DC

## 2016-01-27 NOTE — Telephone Encounter (Signed)
Notified pt Greg [facility].../lmb

## 2016-01-27 NOTE — Telephone Encounter (Signed)
Medication has been sent to pharmacy.  °

## 2016-02-05 ENCOUNTER — Telehealth: Payer: Self-pay | Admitting: Family

## 2016-02-05 DIAGNOSIS — G629 Polyneuropathy, unspecified: Secondary | ICD-10-CM

## 2016-02-05 DIAGNOSIS — G8929 Other chronic pain: Secondary | ICD-10-CM

## 2016-02-05 DIAGNOSIS — M25561 Pain in right knee: Secondary | ICD-10-CM

## 2016-02-05 NOTE — Telephone Encounter (Signed)
Patient is requesting adderall and tramadol.

## 2016-02-05 NOTE — Telephone Encounter (Signed)
Got patient scheduled

## 2016-02-05 NOTE — Telephone Encounter (Signed)
Routing to greg, please advise, thanks 

## 2016-02-05 NOTE — Telephone Encounter (Signed)
Needs office visit has he has not been seen since August 2016.

## 2016-02-08 ENCOUNTER — Ambulatory Visit (INDEPENDENT_AMBULATORY_CARE_PROVIDER_SITE_OTHER): Payer: BLUE CROSS/BLUE SHIELD | Admitting: Family

## 2016-02-08 ENCOUNTER — Encounter: Payer: Self-pay | Admitting: Family

## 2016-02-08 VITALS — BP 150/92 | HR 108 | Temp 98.0°F | Resp 16 | Ht 71.0 in | Wt 213.0 lb

## 2016-02-08 DIAGNOSIS — M67432 Ganglion, left wrist: Secondary | ICD-10-CM

## 2016-02-08 DIAGNOSIS — M25561 Pain in right knee: Secondary | ICD-10-CM | POA: Diagnosis not present

## 2016-02-08 DIAGNOSIS — G8929 Other chronic pain: Secondary | ICD-10-CM

## 2016-02-08 DIAGNOSIS — F988 Other specified behavioral and emotional disorders with onset usually occurring in childhood and adolescence: Secondary | ICD-10-CM | POA: Diagnosis not present

## 2016-02-08 MED ORDER — TRAMADOL HCL 50 MG PO TABS: ORAL_TABLET | ORAL | 0 refills | Status: DC

## 2016-02-08 MED ORDER — AMPHETAMINE-DEXTROAMPHETAMINE 30 MG PO TABS: 30.0000 mg | ORAL_TABLET | Freq: Two times a day (BID) | ORAL | 0 refills | Status: DC

## 2016-02-08 NOTE — Assessment & Plan Note (Signed)
Symptoms and exam consistent with family insists of left wrist currently managed with splinting and nonpharmacological treatment. Patient indicates secondary to nerve proximity ganglion cyst is unable to be surgically altered. Recommend thumb spica splint as needed and continue with icing regimen. Follow-up if symptoms worsen or do not improve.

## 2016-02-08 NOTE — Patient Instructions (Signed)
Thank you for choosing [facility].  SUMMARY AND INSTRUCTIONS:  Please continue to take your medication as prescribed.   Medication:  Your prescription(s) have been submitted to your pharmacy or been printed and provided for you. Please take as directed and contact our office if you believe you are having problem(s) with the medication(s) or have any questions.  Follow up:  If your symptoms worsen or fail to improve, please contact our office for further instruction, or in case of emergency go directly to the emergency room at the closest medical facility.     

## 2016-02-08 NOTE — Assessment & Plan Note (Addendum)
Continues to experience right knee pain of undetermined origin and currently managed with tramadol. Indicates he remains functional with current medication regimen and no adverse side effects. Recommend a knee brace to help reduce the pressure and support the knee. Continue current dosage of tramadol. Continue to monitor.

## 2016-02-08 NOTE — Assessment & Plan Note (Signed)
ADD appears stable with current medication regimen and no adverse side effects. Sleeping and eating well. No cardiac symptoms. Continue current dosage of Adderall. [place] controlled substance database reviewed with no irregularities. Due for urine drug screen at next office visit.

## 2016-02-08 NOTE — Progress Notes (Signed)
Subjective:    Patient ID: Billy Bray, male    DOB: [birth year], 46 y.o.   MRN: [id number]  Chief Complaint  Patient presents with  . medication follow up    tramadol and adderall    HPI:  Billy Sealodd M Decock is a 46 y.o. male who  has a past medical history of Allergy; Arthritis; Asthma; Depression; and Kidney stones. and presents today for a follow up office visit.  1.) ADHD - Currently maintained on Adderall and reports taking the medication as prescribed and denies adverse side effects. Concentration and symptoms are generally well controlled with the current dosage of medication. Sleeping approximately 2-3  hours per night at a time. No significant changes to appetite or weight. No headaches, chest pain, shortness of breath, or heart palpitations.    2.) Chronic knee pain - Continues to experience pain located in his right knee with no trauma or evidence of pathology noted on imaging. Maintained on tramadol and gabapentin which he reports taking as prescribed. Denies adverse side effects or constipation. Pain is adequately controlled for the most part with occasional sharp pains. He continues to take 100 mg 4x daily as needed for pain.   3.) Ganglion Cyst - This is a new problem. Associated symptoms of a Ganglion cyst located around a nerve has been going on for about 25 years. Continues to experience pain located around his left thumb. Informs that he is not able to have it surgically removed because interaction with nerve. Recommend thumb spica splint for exacerbation.    Allergies  Allergen Reactions  . Amoxicillin Hives      Outpatient Medications Prior to Visit  Medication Sig Dispense Refill  . albuterol (PROVENTIL HFA;VENTOLIN HFA) 108 (90 Base) MCG/ACT inhaler Inhale 1-2 puffs into the lungs every 6 (six) hours as needed for wheezing or shortness of breath. 1 Inhaler 2  . albuterol (PROVENTIL) (2.5 MG/3ML) 0.083% nebulizer solution Take 3 mLs (2.5 mg total) by nebulization  every 6 (six) hours as needed for wheezing or shortness of breath. 150 mL 1  . Albuterol Sulfate (PROAIR RESPICLICK) 108 (90 Base) MCG/ACT AEPB Inhale 2 puffs into the lungs every 4 (four) hours as needed. 1 each 2  . gabapentin (NEURONTIN) 300 MG capsule TAKE 1 CAPSULE (300 MG TOTAL) BY MOUTH 3 (THREE) TIMES DAILY. 270 capsule 0  . testosterone cypionate (DEPOTESTOSTERONE CYPIONATE) 200 MG/ML injection Inject 1 mL (200 mg total) into the muscle every 14 (fourteen) days. 10 mL 0  . triamcinolone cream (KENALOG) 0.1 % APPLY TO AFFECTED AREA TWICE A DAY FOR 2 WEEKS 60 g 1  . amphetamine-dextroamphetamine (ADDERALL) 30 MG tablet Take 1 tablet by mouth 2 (two) times daily. 60 tablet 0  . traMADol (ULTRAM) 50 MG tablet TAKE 1-2 TABLETS BY MOUTH 4 TIMES DAILY AS NEEDED FOR SEVERE PAIN 240 tablet 0   No facility-administered medications prior to visit.       No past surgical history on file.    Past Medical History:  Diagnosis Date  . Allergy   . Arthritis   . Asthma   . Depression   . Kidney stones       Review of Systems  Constitutional: Negative for appetite change, diaphoresis and unexpected weight change.  Respiratory: Negative for chest tightness and shortness of breath.   Cardiovascular: Negative for chest pain, palpitations and leg swelling.  Musculoskeletal:       Positive for right knee pain and left thumb/wrist pain.   Neurological:  Negative for headaches.  Psychiatric/Behavioral: Negative for decreased concentration and sleep disturbance. The patient is not nervous/anxious.       Objective:    BP (!) 150/92 (BP Location: Left Arm, Patient Position: Sitting, Cuff Size: Normal)   Pulse (!) 108   Temp 98 F (36.7 C) (Oral)   Resp 16   Ht 5\' 11"  (1.803 m)   Wt 213 lb (96.6 kg)   SpO2 95%   BMI 29.71 kg/m  Nursing note and vital signs reviewed.  Physical Exam  Constitutional: He is oriented to person, place, and time. He appears well-developed and well-nourished.  No distress.  Cardiovascular: Normal rate, regular rhythm, normal heart sounds and intact distal pulses.   Pulmonary/Chest: Effort normal and breath sounds normal.  Musculoskeletal:  Right knee - no obvious deformity, discoloration, or edema. Generalized tenderness located around the anterior aspect of the knee. No crepitus or deformity. Range of motion within normal limits. Distal pulses and sensation are intact and appropriate. Ligamentous and meniscal testing are negative.  Left wrist/thumb - no obvious deformity, discussion, or edema. Palpable tenderness along the [facility] joint of the thumb. Range of motion slightly limited secondary to discomfort. Pulses and sensation are intact and appropriate. There is mildly decreased strength secondary to discomfort.  Neurological: He is alert and oriented to person, place, and time.  Skin: Skin is warm and dry.  Psychiatric: He has a normal mood and affect. His behavior is normal. Judgment and thought content normal.       Assessment & Plan:   Problem List Items Addressed This Visit      Musculoskeletal and Integument   Ganglion cyst of wrist, left    Symptoms and exam consistent with family insists of left wrist currently managed with splinting and nonpharmacological treatment. Patient indicates secondary to nerve proximity ganglion cyst is unable to be surgically altered. Recommend thumb spica splint as needed and continue with icing regimen. Follow-up if symptoms worsen or do not improve.        Other   ADD (attention deficit disorder)    ADD appears stable with current medication regimen and no adverse side effects. Sleeping and eating well. No cardiac symptoms. Continue current dosage of Adderall. [place] controlled substance database reviewed with no irregularities. Due for urine drug screen at next office visit.      Relevant Medications   amphetamine-dextroamphetamine (ADDERALL) 30 MG tablet   Knee pain - Primary    Continues to  experience right knee pain of undetermined origin and currently managed with tramadol. Indicates he remains functional with current medication regimen and no adverse side effects. Continue current dosage of tramadol. Continue to monitor.      Relevant Medications   traMADol (ULTRAM) 50 MG tablet       I am having Mr. Want maintain his albuterol, albuterol, Albuterol Sulfate, testosterone cypionate, triamcinolone cream, gabapentin, traMADol, and amphetamine-dextroamphetamine.   Meds ordered this encounter  Medications  . traMADol (ULTRAM) 50 MG tablet    Sig: TAKE 1-2 TABLETS BY MOUTH 4 TIMES DAILY AS NEEDED FOR SEVERE PAIN    Dispense:  240 tablet    Refill:  0    Not to exceed 5 additional fills before 06/07/2016    Order Specific Question:   Supervising Provider    Answer:   Hillard Danker A [[id number]]  . DISCONTD: amphetamine-dextroamphetamine (ADDERALL) 30 MG tablet    Sig: Take 1 tablet by mouth 2 (two) times daily.    Dispense:  60  tablet    Refill:  0    Fill on or after 02/09/16.    Order Specific Question:   Supervising Provider    Answer:   Hillard Danker A [[id number]]  . DISCONTD: amphetamine-dextroamphetamine (ADDERALL) 30 MG tablet    Sig: Take 1 tablet by mouth 2 (two) times daily.    Dispense:  60 tablet    Refill:  0    Fill on or after 03/10/16.    Order Specific Question:   Supervising Provider    Answer:   Hillard Danker A [[id number]]  . amphetamine-dextroamphetamine (ADDERALL) 30 MG tablet    Sig: Take 1 tablet by mouth 2 (two) times daily.    Dispense:  60 tablet    Refill:  0    Fill on or after 04/09/16.    Order Specific Question:   Supervising Provider    Answer:   Hillard Danker A [[id number]]     Follow-up: Return in about 3 months (around 05/08/2016), or if symptoms worsen or fail to improve.  Jeanine Luz, FNP

## 2016-03-28 ENCOUNTER — Telehealth: Payer: Self-pay | Admitting: Family

## 2016-03-28 NOTE — Telephone Encounter (Signed)
Pt called stating that he received a bill for $125 for the knee brace that he got from his appointment. He was told that it would be made medically necessary so that his insurance would cover it. Please advise. Thanks [employer]

## 2016-03-28 NOTE — Telephone Encounter (Signed)
It was placed through insurance and notes were made to show medical necessity. It is up to his insurance and his copay as to the price of the brace that he has to pay.

## 2016-03-29 NOTE — Telephone Encounter (Signed)
Please help follow up with pt.

## 2016-03-30 NOTE — Telephone Encounter (Signed)
LVM for pt stating Greg's message below

## 2016-04-18 ENCOUNTER — Other Ambulatory Visit: Payer: Self-pay | Admitting: Family

## 2016-05-09 ENCOUNTER — Ambulatory Visit (INDEPENDENT_AMBULATORY_CARE_PROVIDER_SITE_OTHER): Payer: BLUE CROSS/BLUE SHIELD | Admitting: Family

## 2016-05-09 ENCOUNTER — Encounter: Payer: Self-pay | Admitting: Family

## 2016-05-09 VITALS — BP 128/90 | HR 107 | Temp 98.0°F | Resp 16 | Ht 71.0 in | Wt 211.8 lb

## 2016-05-09 DIAGNOSIS — M25561 Pain in right knee: Secondary | ICD-10-CM | POA: Diagnosis not present

## 2016-05-09 DIAGNOSIS — E291 Testicular hypofunction: Secondary | ICD-10-CM

## 2016-05-09 DIAGNOSIS — G8929 Other chronic pain: Secondary | ICD-10-CM

## 2016-05-09 DIAGNOSIS — F988 Other specified behavioral and emotional disorders with onset usually occurring in childhood and adolescence: Secondary | ICD-10-CM

## 2016-05-09 MED ORDER — TRAMADOL HCL 50 MG PO TABS: ORAL_TABLET | ORAL | 0 refills | Status: DC

## 2016-05-09 MED ORDER — AMPHETAMINE-DEXTROAMPHET ER 30 MG PO CP24: 30.0000 mg | ORAL_CAPSULE | Freq: Every day | ORAL | 0 refills | Status: DC

## 2016-05-09 MED ORDER — AMPHETAMINE-DEXTROAMPHETAMINE 20 MG PO TABS: 20.0000 mg | ORAL_TABLET | Freq: Every day | ORAL | 0 refills | Status: DC | PRN

## 2016-05-09 NOTE — Patient Instructions (Signed)
Thank you for choosing [facility].  SUMMARY AND INSTRUCTIONS:  Please continue to take your medications as prescribed.  Take the ER Adderall in the morning and the regular release mid-morning / early afternoon.   Watch for sleep and appetite changes.  Please complete labs before 10am.   Medication:  Your prescription(s) have been submitted to your pharmacy or been printed and provided for you. Please take as directed and contact our office if you believe you are having problem(s) with the medication(s) or have any questions.  Labs:  Please stop by the lab on the lower level of the building for your blood work. Your results will be released to MyChart (or called to you) after review, usually within 72 hours after test completion. If any changes need to be made, you will be notified at that same time.  1.) The lab is open from 7:30am to 5:30 pm Monday-Friday 2.) No appointment is necessary 3.) Fasting (if needed) is 6-8 hours after food and drink; black coffee and water are okay   Follow up:  If your symptoms worsen or fail to improve, please contact our office for further instruction, or in case of emergency go directly to the emergency room at the closest medical facility.

## 2016-05-09 NOTE — Assessment & Plan Note (Signed)
Stable and functional with current medication regimen and no adverse side effects or constipation. Continue current dosage of Tramadol. [place] Controlled Substance Database reviewed with no irregularities.

## 2016-05-09 NOTE — Progress Notes (Signed)
Subjective:    Patient ID: Billy Bray, male    DOB: June 27, 1970, 46 y.o.   MRN: [id number]  Chief Complaint  Patient presents with  . Follow-up    refill on tramadol and adderall states that he takes 2 adderall a day and he feels tired.     HPI:  Billy Bray is a 46 y.o. male who  has a past medical history of Allergy; Arthritis; Asthma; Depression; and Kidney stones. and presents today for a follow up office visit.   1.) ADHD - Currently maintained on Adderall and reports taking the medication as prescribed and denies adverse side effects. Concentration and symptoms are labile with the current medication regimen and feels tired at times.  Sleeping approximately 6-8 hours per night. No significant changes to appetite or weight. No headaches, chest pain, shortness of breath, or heart palpitations.   2.) Chronic knee pain - Currently maintained on Tramadol. Reports taking the medication as prescribed and denies adverse side effects or constipation.  Able to complete his activities of daily and work functions with the current regimen.    Allergies  Allergen Reactions  . Amoxicillin Hives      Outpatient Medications Prior to Visit  Medication Sig Dispense Refill  . albuterol (PROVENTIL HFA;VENTOLIN HFA) 108 (90 Base) MCG/ACT inhaler Inhale 1-2 puffs into the lungs every 6 (six) hours as needed for wheezing or shortness of breath. 1 Inhaler 2  . albuterol (PROVENTIL) (2.5 MG/3ML) 0.083% nebulizer solution Take 3 mLs (2.5 mg total) by nebulization every 6 (six) hours as needed for wheezing or shortness of breath. 150 mL 1  . Albuterol Sulfate (PROAIR RESPICLICK) 108 (90 Base) MCG/ACT AEPB Inhale 2 puffs into the lungs every 4 (four) hours as needed. 1 each 2  . gabapentin (NEURONTIN) 300 MG capsule TAKE 1 CAPSULE (300 MG TOTAL) BY MOUTH 3 (THREE) TIMES DAILY. 270 capsule 0  . testosterone cypionate (DEPOTESTOSTERONE CYPIONATE) 200 MG/ML injection Inject 1 mL (200 mg total) into the  muscle every 14 (fourteen) days. 10 mL 0  . triamcinolone cream (KENALOG) 0.1 % APPLY TO AFFECTED AREA TWICE A DAY FOR 2 WEEKS 60 g 1  . amphetamine-dextroamphetamine (ADDERALL) 30 MG tablet Take 1 tablet by mouth 2 (two) times daily. 60 tablet 0  . traMADol (ULTRAM) 50 MG tablet TAKE 1-2 TABLETS BY MOUTH 4 TIMES DAILY AS NEEDED FOR SEVERE PAIN 240 tablet 0   No facility-administered medications prior to visit.       No past surgical history on file.    Past Medical History:  Diagnosis Date  . Allergy   . Arthritis   . Asthma   . Depression   . Kidney stones     Review of Systems  Constitutional: Negative for appetite change, diaphoresis, fatigue, fever and unexpected weight change.  Respiratory: Negative for chest tightness and shortness of breath.   Cardiovascular: Negative for chest pain, palpitations and leg swelling.  Neurological: Negative for dizziness and light-headedness.  Psychiatric/Behavioral: Negative for decreased concentration and sleep disturbance. The patient is not nervous/anxious and is not hyperactive.       Objective:    BP 128/90 (BP Location: Left Arm, Patient Position: Sitting, Cuff Size: Large)   Pulse (!) 107   Temp 98 F (36.7 C) (Oral)   Resp 16   Ht  (1.803 m)   Wt 211 lb 12.8 oz (96.1 kg)   SpO2 97%   BMI 29.54 kg/m  Nursing note and vital signs  reviewed.  Physical Exam  Constitutional: He is oriented to person, place, and time. He appears well-developed and well-nourished. No distress.  Cardiovascular: Normal rate, regular rhythm, normal heart sounds and intact distal pulses.   Pulmonary/Chest: Effort normal and breath sounds normal.  Neurological: He is alert and oriented to person, place, and time.  Skin: Skin is warm and dry.  Psychiatric: He has a normal mood and affect. His behavior is normal. Judgment and thought content normal.       Assessment & Plan:   Problem List Items Addressed This Visit      Endocrine    Secondary male hypogonadism   Relevant Orders   Testosterone   CBC     Other   ADD (attention deficit disorder) - Primary    Adequately controlled with some feelings of fatigue towards the afternoons. Sleeping and eating adequately with no cardiac symptoms. Change Adderall to 30 mg extended release with 20 mg immediate release as needed once daily. [place] Controlled Substance Database reviewed with no irregularities. Follow up in 3 months or sooner if needed.       Knee pain    Stable and functional with current medication regimen and no adverse side effects or constipation. Continue current dosage of Tramadol. [place] Controlled Substance Database reviewed with no irregularities.       Relevant Medications   traMADol (ULTRAM) 50 MG tablet       I have discontinued Mr. Antenucci amphetamine-dextroamphetamine. I am also having him start on amphetamine-dextroamphetamine and amphetamine-dextroamphetamine. Additionally, I am having him maintain his albuterol, albuterol, Albuterol Sulfate, testosterone cypionate, triamcinolone cream, gabapentin, and traMADol.   Meds ordered this encounter  Medications  . traMADol (ULTRAM) 50 MG tablet    Sig: TAKE 1-2 TABLETS BY MOUTH 4 TIMES DAILY AS NEEDED FOR SEVERE PAIN    Dispense:  240 tablet    Refill:  0    Not to exceed 5 additional fills before 06/07/2016    Order Specific Question:   Supervising Provider    Answer:   Hillard Danker A [[id number]]  . amphetamine-dextroamphetamine (ADDERALL XR) 30 MG 24 hr capsule    Sig: Take 1 capsule (30 mg total) by mouth daily.    Dispense:  30 capsule    Refill:  0    Order Specific Question:   Supervising Provider    Answer:   Hillard Danker A [[id number]]  . amphetamine-dextroamphetamine (ADDERALL) 20 MG tablet    Sig: Take 1 tablet (20 mg total) by mouth daily as needed.    Dispense:  30 tablet    Refill:  0    Order Specific Question:   Supervising Provider    Answer:   Hillard Danker A [[id number]]      Follow-up: Return in about 3 months (around 08/08/2016), or if symptoms worsen or fail to improve.  Jeanine Luz, FNP

## 2016-05-09 NOTE — Assessment & Plan Note (Signed)
Adequately controlled with some feelings of fatigue towards the afternoons. Sleeping and eating adequately with no cardiac symptoms. Change Adderall to 30 mg extended release with 20 mg immediate release as needed once daily. [place] Controlled Substance Database reviewed with no irregularities. Follow up in 3 months or sooner if needed.

## 2016-05-11 ENCOUNTER — Ambulatory Visit (INDEPENDENT_AMBULATORY_CARE_PROVIDER_SITE_OTHER): Payer: BLUE CROSS/BLUE SHIELD | Admitting: Sports Medicine

## 2016-05-11 ENCOUNTER — Ambulatory Visit (INDEPENDENT_AMBULATORY_CARE_PROVIDER_SITE_OTHER): Payer: BLUE CROSS/BLUE SHIELD

## 2016-05-11 ENCOUNTER — Encounter: Payer: Self-pay | Admitting: Sports Medicine

## 2016-05-11 VITALS — BP 140/96 | HR 96 | Ht 71.0 in | Wt 214.2 lb

## 2016-05-11 DIAGNOSIS — M542 Cervicalgia: Secondary | ICD-10-CM | POA: Diagnosis not present

## 2016-05-11 DIAGNOSIS — M47812 Spondylosis without myelopathy or radiculopathy, cervical region: Secondary | ICD-10-CM | POA: Diagnosis not present

## 2016-05-11 DIAGNOSIS — M79601 Pain in right arm: Secondary | ICD-10-CM

## 2016-05-11 DIAGNOSIS — M2550 Pain in unspecified joint: Secondary | ICD-10-CM | POA: Diagnosis not present

## 2016-05-11 LAB — COMPREHENSIVE METABOLIC PANEL
ALT: 17 U/L (ref 0–53)
AST: 14 U/L (ref 0–37)
Albumin: 4.6 g/dL (ref 3.5–5.2)
Alkaline Phosphatase: 43 U/L (ref 39–117)
BUN: 16 mg/dL (ref 6–23)
CO2: 28 mEq/L (ref 19–32)
Calcium: 9.4 mg/dL (ref 8.4–10.5)
Chloride: 102 mEq/L (ref 96–112)
Creatinine, Ser: 0.97 mg/dL (ref 0.40–1.50)
GFR: 88.93 mL/min (ref >60.00)
Glucose, Bld: 104 mg/dL — ABNORMAL HIGH (ref 70–99)
Potassium: 4 mEq/L (ref 3.5–5.1)
Sodium: 136 mEq/L (ref 135–145)
Total Bilirubin: 0.4 mg/dL (ref 0.2–1.2)
Total Protein: 7.2 g/dL (ref 6.0–8.3)

## 2016-05-11 LAB — CBC WITH DIFFERENTIAL/PLATELET
Basophils Absolute: 0 10*3/uL (ref 0.0–0.1)
Basophils Relative: 0.6 % (ref 0.0–3.0)
Eosinophils Absolute: 0.1 10*3/uL (ref 0.0–0.7)
Eosinophils Relative: 0.8 % (ref 0.0–5.0)
HCT: 46.2 % (ref 39.0–52.0)
Hemoglobin: 15.9 g/dL (ref 13.0–17.0)
Lymphocytes Relative: 18.8 % (ref 12.0–46.0)
Lymphs Abs: 1.3 10*3/uL (ref 0.7–4.0)
MCHC: 34.4 g/dL (ref 30.0–36.0)
MCV: 91 fl (ref 78.0–100.0)
Monocytes Absolute: 0.6 10*3/uL (ref 0.1–1.0)
Monocytes Relative: 8.1 % (ref 3.0–12.0)
Neutro Abs: 5 10*3/uL (ref 1.4–7.7)
Neutrophils Relative %: 71.7 % (ref 43.0–77.0)
Platelets: 229 10*3/uL (ref 150.0–400.0)
RBC: 5.08 Mil/uL (ref 4.22–5.81)
RDW: 13.3 % (ref 11.5–15.5)
WBC: 6.9 10*3/uL (ref 4.0–10.5)

## 2016-05-11 LAB — SEDIMENTATION RATE: Sed Rate: 2 mm/hr (ref 0–15)

## 2016-05-11 MED ORDER — GABAPENTIN 300 MG PO CAPS: ORAL_CAPSULE | ORAL | 0 refills | Status: DC

## 2016-05-11 MED ORDER — METHYLPREDNISOLONE 4 MG PO TBPK: ORAL_TABLET | ORAL | 0 refills | Status: DC
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
## 2016-05-24 ENCOUNTER — Ambulatory Visit (INDEPENDENT_AMBULATORY_CARE_PROVIDER_SITE_OTHER): Payer: BLUE CROSS/BLUE SHIELD | Admitting: Sports Medicine

## 2016-05-24 ENCOUNTER — Encounter: Payer: Self-pay | Admitting: Family

## 2016-05-24 ENCOUNTER — Encounter: Payer: Self-pay | Admitting: Sports Medicine

## 2016-05-24 ENCOUNTER — Other Ambulatory Visit (INDEPENDENT_AMBULATORY_CARE_PROVIDER_SITE_OTHER): Payer: BLUE CROSS/BLUE SHIELD

## 2016-05-24 VITALS — BP 140/90 | HR 89 | Ht 71.0 in | Wt 219.0 lb

## 2016-05-24 DIAGNOSIS — M542 Cervicalgia: Secondary | ICD-10-CM | POA: Diagnosis not present

## 2016-05-24 DIAGNOSIS — M25511 Pain in right shoulder: Secondary | ICD-10-CM | POA: Diagnosis not present

## 2016-05-24 DIAGNOSIS — M2550 Pain in unspecified joint: Secondary | ICD-10-CM

## 2016-05-24 DIAGNOSIS — E291 Testicular hypofunction: Secondary | ICD-10-CM

## 2016-05-24 DIAGNOSIS — G629 Polyneuropathy, unspecified: Secondary | ICD-10-CM | POA: Diagnosis not present

## 2016-05-24 LAB — CBC
HCT: 45.8 % (ref 39.0–52.0)
Hemoglobin: 15.7 g/dL (ref 13.0–17.0)
MCHC: 34.4 g/dL (ref 30.0–36.0)
MCV: 92.4 fl (ref 78.0–100.0)
Platelets: 229 10*3/uL (ref 150.0–400.0)
RBC: 4.95 Mil/uL (ref 4.22–5.81)
RDW: 13.9 % (ref 11.5–15.5)
WBC: 8.1 10*3/uL (ref 4.0–10.5)

## 2016-05-24 LAB — TESTOSTERONE: Testosterone: 405.98 ng/dL (ref 300.00–890.00)

## 2016-05-24 MED ORDER — CYCLOBENZAPRINE HCL 10 MG PO TABS: 10.0000 mg | ORAL_TABLET | Freq: Three times a day (TID) | ORAL | 1 refills | Status: DC | PRN

## 2016-05-24 NOTE — Assessment & Plan Note (Signed)
Believe this to be secondary to referred pain.  Intrinsic shoulder strength is intact.

## 2016-05-24 NOTE — Progress Notes (Signed)
OFFICE VISIT NOTE Billy Bray. Billy [facility] at [facility] [phone]  Billy Bray - 46 y.o. male MRN [id number]  Date of birth: [birth year]  Visit Date: 05/24/2016  PCP: Billy Luz, FNP   Referred by: Billy Speak, FNP  Billy Bray,cma acting as scribe for Billy Bray.  SUBJECTIVE:   Chief Complaint  Patient presents with  . 2 Week Follow Up RT Neck/Shoulder Pain   HPI: As below and per problem based documentation when appropriate.  Xrays obtained and reviewed after last  OV. Pt has completed a steriod dose pack with only minimal relief relief. Currently taking Tramadol  1-2 tablets qid with some relief. He is agreeable for a steroid injection today.  Pain continues to radiate into his right hand.  He has significant pain when turning his neck.      Review of Systems  Constitutional: Negative.   HENT: Negative.   Gastrointestinal: Negative.   Genitourinary: Negative.   Musculoskeletal: Positive for joint pain and neck pain.  Skin: Negative.   Neurological: Negative.   Endo/Heme/Allergies: Negative.   Psychiatric/Behavioral: Negative.     Otherwise per HPI.  HISTORY & PERTINENT PRIOR DATA:  No specialty comments available. He reports that he has quit smoking. He has never used smokeless tobacco. No results for input(s): HGBA1C, LABURIC in the last 8760 hours. Medications & Allergies reviewed per EMR Patient Active Problem List   Diagnosis Date Noted  . Neck pain 05/24/2016  . Polyarthralgia 05/24/2016  . Ganglion cyst of wrist, left 02/08/2016  . Asthma 09/14/2015  . Secondary male hypogonadism 06/17/2015  . Right shoulder pain 05/18/2015  . Lump in armpit 02/20/2015  . ADD (attention deficit disorder) 09/02/2014  . Knee pain 09/02/2014  . Neuropathy 09/02/2014   Past Medical History:  Diagnosis Date  . Allergy   . Arthritis   . Asthma   . Depression   . Kidney stones    Family History  Problem  Relation Age of Onset  . Healthy Mother   . Healthy Father   . Stroke Maternal Grandmother   . Diabetes Maternal Grandmother   . Stroke Maternal Grandfather   . Diabetes Maternal Grandfather   . Colon cancer Paternal Grandfather    No past surgical history on file. Social History   Occupational History  . Electrician    Social History Main Topics  . Smoking status: Former Games developer  . Smokeless tobacco: Never Used  . Alcohol use No  . Drug use: Yes    Types: Marijuana  . Sexual activity: Not on file    OBJECTIVE:  VS:  HT:5\' 11"  (180.3 cm)   WT:219 lb (99.3 kg)  BMI:30.6    BP:140/90  HR:89bpm  TEMP: ( )  RESP:98 % Physical Exam  Constitutional: He appears well-developed and well-nourished. He is cooperative.  Non-toxic appearance. No distress.  HENT:  Head: Normocephalic and atraumatic.  Cardiovascular: Intact distal pulses.   Pulmonary/Chest: No accessory muscle usage. No respiratory distress.  Neurological: He is alert. He is not disoriented. No sensory deficit.  Skin: Skin is warm, dry and intact. Capillary refill takes less than 2 seconds. No abrasion and no rash noted.  He has changes of the cuticles as well as pitting changes of the nails specifically, first, second and third bilateral fingers as well as some minimal subungual hematoma that is nonpainful secondary to trauma from his occupation.  Otherwise no significant psoriatic skin changes appreciated.  Psychiatric: He  has a normal mood and affect. His speech is normal and behavior is normal. Thought content normal.   Back Exam   Comments:  Neck:  Well aligned, no significant torticollis No significant midline tenderness.   Range of motion: Cervical side bending is restricted to the right, cervical rotation to left is minimal.   Brachial plexus squeeze and arm squeeze test are painful on the right, normal on the left. Pain with Lhermitte's compression test that radiates to the neck. Upper extreme a  sensation is diffusely diminished.  Lower extremity reflexes are normal at 2+/4 diffusely. Upper extremity reflexes are symmetric Negative Hoffman's reflex  Marked pain with Tinel's at the elbow and wrist Minimal pain carpal tunnel compression test.     IMAGING & PROCEDURES: Dg Cervical Spine 2 Or 3 Views  Result Date: 05/11/2016 CLINICAL DATA:  Neck pain EXAM: CERVICAL SPINE - 2-3 VIEW COMPARISON:  05/21/2009 FINDINGS: Reversal of the normal upper cervical lordosis. Cervical spine is visualized to C7-T1 on the lateral view. No evidence of fracture or dislocation. Vertebral body heights are maintained. Dens appears intact, although a dedicated odontoid view was not performed. No prevertebral soft tissue swelling. Mild degenerative changes of the mid/lower cervical spine. Visualized lung apices are clear. IMPRESSION: No fracture or dislocation is seen. Mild degenerative changes. Electronically Signed   By: Billy Bray M.D.   On: 05/11/2016 16:40   No additional findings.   ASSESSMENT & PLAN:  Visit Diagnoses:  1. Acute pain of right shoulder   2. Neuropathy   3. Neck pain   4. Polyarthralgia    Meds:  Meds ordered this encounter  Medications  . cyclobenzaprine (FLEXERIL) 10 MG tablet    Sig: Take 1 tablet (10 mg total) by mouth 3 (three) times daily as needed for muscle spasms.    Dispense:  30 tablet    Refill:  1    Orders:  Orders Placed This Encounter  Procedures  . MR Cervical Spine Wo Contrast    Follow-up: Return for MRI Review.  Otherwise please see problem oriented charting as below.  CMA/ATC served as Neurosurgeon during this visit. History, Physical, and Plan performed by medical provider. Documentation and orders reviewed and attested to.      Billy Bidding, DO    [facility] Physician    05/25/2016 6:16 AM
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
## 2016-05-24 NOTE — Assessment & Plan Note (Signed)
Consider nerve conduction studies if MRI unrevealing.

## 2016-05-24 NOTE — Assessment & Plan Note (Signed)
Consider further autoimmune workup including workup for psoriatic arthritis if findings of inflammatory arthropathy on MRI.

## 2016-05-25 ENCOUNTER — Ambulatory Visit: Payer: BLUE CROSS/BLUE SHIELD | Admitting: Sports Medicine

## 2016-05-30 ENCOUNTER — Ambulatory Visit: Payer: BLUE CROSS/BLUE SHIELD | Admitting: Sports Medicine

## 2016-06-05 ENCOUNTER — Ambulatory Visit
Admission: RE | Admit: 2016-06-05 | Discharge: 2016-06-05 | Disposition: A | Discharge status: Home / Self Care | Payer: BLUE CROSS/BLUE SHIELD | Source: Ambulatory Visit | Attending: Sports Medicine | Admitting: Sports Medicine

## 2016-06-05 DIAGNOSIS — M5021 Other cervical disc displacement,  high cervical region: Secondary | ICD-10-CM | POA: Diagnosis not present

## 2016-06-05 DIAGNOSIS — M25511 Pain in right shoulder: Secondary | ICD-10-CM

## 2016-06-05 DIAGNOSIS — M542 Cervicalgia: Secondary | ICD-10-CM

## 2016-06-06 ENCOUNTER — Telehealth: Payer: Self-pay | Admitting: *Deleted

## 2016-06-06 DIAGNOSIS — M25561 Pain in right knee: Principal | ICD-10-CM

## 2016-06-06 DIAGNOSIS — G8929 Other chronic pain: Secondary | ICD-10-CM

## 2016-06-06 MED ORDER — AMPHETAMINE-DEXTROAMPHET ER 30 MG PO CP24: 30.0000 mg | ORAL_CAPSULE | Freq: Every day | ORAL | 0 refills | Status: DC

## 2016-06-06 MED ORDER — AMPHETAMINE-DEXTROAMPHETAMINE 20 MG PO TABS
20.0000 mg | ORAL_TABLET | Freq: Every day | ORAL | 0 refills | Status: DC | PRN
Start: 2016-06-06 — End: 2016-07-07

## 2016-06-06 MED ORDER — TRAMADOL HCL 50 MG PO TABS: ORAL_TABLET | ORAL | 0 refills | Status: DC

## 2016-06-06 NOTE — Telephone Encounter (Signed)
Medications printed and ready to be picked up. Dated according to most recent fills.

## 2016-06-06 NOTE — Telephone Encounter (Signed)
Rec'd call pt requesting refills on both strength of Adderalls and Tramadol.Billy Bray./LMB

## 2016-06-07 NOTE — Telephone Encounter (Signed)
Notified pt rx ready for pick-up.../lmb 

## 2016-06-13 NOTE — Assessment & Plan Note (Signed)
Symptoms are consistent with cervical radiculitis versus low likelihood of carpal tunnel syndrome.  We will start with gabapentin as well as general therapeutic exercises for cervical spine which were provided today from the [facility] spine conditioning program.

## 2016-06-13 NOTE — Assessment & Plan Note (Signed)
Neck stiffness and pain is likely coming from the underlying degenerative changes noted on x-ray.  No red flag symptoms today on exam.  We discussed the utility of corticosteroids and/or gabapentinoids.

## 2016-06-13 NOTE — Assessment & Plan Note (Signed)
We will check basic inflammatory markers try to minimize the pocket cost for him at this time.  If any lack of improvement or persistent ongoing symptoms consider further diagnostic workup with full autoimmune panel.  He does have pitting of his nails and may have findings of psoriatic arthritis.

## 2016-06-16 ENCOUNTER — Other Ambulatory Visit: Payer: Self-pay | Admitting: Family

## 2016-06-21 NOTE — Telephone Encounter (Signed)
[facility].../lmb 

## 2016-07-07 ENCOUNTER — Telehealth: Payer: Self-pay | Admitting: *Deleted

## 2016-07-07 DIAGNOSIS — G8929 Other chronic pain: Secondary | ICD-10-CM

## 2016-07-07 DIAGNOSIS — M25561 Pain in right knee: Principal | ICD-10-CM

## 2016-07-07 MED ORDER — AMPHETAMINE-DEXTROAMPHET ER 30 MG PO CP24: 30.0000 mg | ORAL_CAPSULE | Freq: Every day | ORAL | 0 refills | Status: DC

## 2016-07-07 MED ORDER — AMPHETAMINE-DEXTROAMPHETAMINE 20 MG PO TABS
20.0000 mg | ORAL_TABLET | Freq: Every day | ORAL | 0 refills | Status: DC | PRN
Start: 2016-07-07 — End: 2016-08-08

## 2016-07-07 MED ORDER — TRAMADOL HCL 50 MG PO TABS: ORAL_TABLET | ORAL | 0 refills | Status: DC

## 2016-07-07 NOTE — Telephone Encounter (Signed)
Printed script, MD signed, notified pt rx ready for pick-up.../lmb 

## 2016-07-07 NOTE — Telephone Encounter (Signed)
Tammy SoursGreg is out of office until Monday pls advise on msg below...Raechel Chute/lmb

## 2016-07-07 NOTE — Telephone Encounter (Signed)
Rec'd call pt requesting refill on both Adderalls and tramadol...Raechel Chute/lmb

## 2016-07-07 NOTE — Telephone Encounter (Signed)
[place] controlled substance database checked.  Ok to fill medication.  

## 2016-08-03 ENCOUNTER — Other Ambulatory Visit: Payer: Self-pay

## 2016-08-03 ENCOUNTER — Telehealth: Payer: Self-pay | Admitting: Sports Medicine

## 2016-08-03 DIAGNOSIS — M542 Cervicalgia: Secondary | ICD-10-CM

## 2016-08-03 MED ORDER — GABAPENTIN 300 MG PO CAPS: ORAL_CAPSULE | ORAL | 0 refills | Status: DC

## 2016-08-03 MED ORDER — CYCLOBENZAPRINE HCL 10 MG PO TABS: 10.0000 mg | ORAL_TABLET | Freq: Three times a day (TID) | ORAL | 1 refills | Status: DC | PRN

## 2016-08-03 NOTE — Telephone Encounter (Signed)
Last office visit 05/24/2016. Last refill of Cyclobenzaprine 05/24/16 #30/1 refill. OK to refill? Also, please confirm directions for Gabapentin 300 mg, chart says 1 tab po in AM and early afternoon. 3 tabs po qhs.

## 2016-08-03 NOTE — Telephone Encounter (Signed)
Patient needs clarification on the gabapentin (NEURONTIN) 300 MG capsule.  ALSO,  **Remind patient they can make refill requests via MyChart**  Medication refill request (Name & Dosage):  cyclobenzaprine (FLEXERIL) 10 MG tablet  Preferred pharmacy (Name & Address):  [id number] [address], [place] - [address]   Other comments (if applicable):

## 2016-08-03 NOTE — Telephone Encounter (Signed)
Okay to refill both the gabapentin and cyclobenzaprine.  Yes 300 mg p.o. every morning and q. afternoon +3 tabs p.o. nightly for the gabapentin.  It does not look like he has followed up after his MRI and this needs to be scheduled.  We can consider an epidural steroid injection for him.

## 2016-08-03 NOTE — Telephone Encounter (Signed)
Spoke with patient and verified rx directions. Rx have been ref[facility] [place]. OV has been scheduled for 08/05/2016 to f/u on MRI and treatment options.

## 2016-08-04 NOTE — Telephone Encounter (Signed)
Patient calling to check on refill for his gabapentin (NEURONTIN) 300 MG capsule due to the pharmacy not filling it. Brandy spoke with the pharmacy and was told that they could not fill it yesterday due to it being too early. The pharmacy is filling it now for patient to pick up. Patient is aware. No further action required.

## 2016-08-05 ENCOUNTER — Ambulatory Visit (INDEPENDENT_AMBULATORY_CARE_PROVIDER_SITE_OTHER): Payer: BLUE CROSS/BLUE SHIELD | Admitting: Sports Medicine

## 2016-08-05 ENCOUNTER — Other Ambulatory Visit: Payer: Self-pay | Admitting: Sports Medicine

## 2016-08-05 ENCOUNTER — Encounter: Payer: Self-pay | Admitting: Sports Medicine

## 2016-08-05 VITALS — BP 122/82 | HR 83 | Ht 71.0 in | Wt 211.2 lb

## 2016-08-05 DIAGNOSIS — M5030 Other cervical disc degeneration, unspecified cervical region: Secondary | ICD-10-CM

## 2016-08-05 DIAGNOSIS — G629 Polyneuropathy, unspecified: Secondary | ICD-10-CM

## 2016-08-05 DIAGNOSIS — M542 Cervicalgia: Secondary | ICD-10-CM | POA: Diagnosis not present

## 2016-08-05 DIAGNOSIS — M4302 Spondylolysis, cervical region: Secondary | ICD-10-CM
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
## 2016-08-08 ENCOUNTER — Encounter: Payer: Self-pay | Admitting: Family

## 2016-08-08 ENCOUNTER — Ambulatory Visit (INDEPENDENT_AMBULATORY_CARE_PROVIDER_SITE_OTHER): Payer: BLUE CROSS/BLUE SHIELD | Admitting: Family

## 2016-08-08 VITALS — BP 136/88 | HR 113 | Temp 98.1°F | Resp 16 | Ht 71.0 in | Wt 210.0 lb

## 2016-08-08 DIAGNOSIS — Z23 Encounter for immunization: Secondary | ICD-10-CM | POA: Diagnosis not present

## 2016-08-08 DIAGNOSIS — M25561 Pain in right knee: Secondary | ICD-10-CM

## 2016-08-08 DIAGNOSIS — G8929 Other chronic pain: Secondary | ICD-10-CM | POA: Diagnosis not present

## 2016-08-08 DIAGNOSIS — M26609 Unspecified temporomandibular joint disorder, unspecified side: Secondary | ICD-10-CM

## 2016-08-08 DIAGNOSIS — F988 Other specified behavioral and emotional disorders with onset usually occurring in childhood and adolescence: Secondary | ICD-10-CM

## 2016-08-08 MED ORDER — AMPHETAMINE-DEXTROAMPHET ER 30 MG PO CP24: 30.0000 mg | ORAL_CAPSULE | Freq: Every day | ORAL | 0 refills | Status: DC

## 2016-08-08 MED ORDER — AMPHETAMINE-DEXTROAMPHETAMINE 20 MG PO TABS
20.0000 mg | ORAL_TABLET | Freq: Every day | ORAL | 0 refills | Status: DC | PRN
Start: 2016-08-08 — End: 2016-08-08

## 2016-08-08 MED ORDER — AMPHETAMINE-DEXTROAMPHET ER 30 MG PO CP24
30.0000 mg | ORAL_CAPSULE | Freq: Every day | ORAL | 0 refills | Status: DC
Start: 2016-08-08 — End: 2016-08-08

## 2016-08-08 MED ORDER — AMPHETAMINE-DEXTROAMPHETAMINE 20 MG PO TABS: 20.0000 mg | ORAL_TABLET | Freq: Every day | ORAL | 0 refills | Status: DC | PRN

## 2016-08-08 MED ORDER — TRAMADOL HCL 50 MG PO TABS: ORAL_TABLET | ORAL | 0 refills | Status: DC

## 2016-08-08 MED ORDER — TRIAMCINOLONE ACETONIDE 0.1 % EX CREA: TOPICAL_CREAM | CUTANEOUS | 1 refills | Status: DC

## 2016-08-08 NOTE — Assessment & Plan Note (Signed)
Chronic knee pain is stable with current dosage of tramadol unable to function and complete his activities of daily living and work. No constipation. Continue current dosage of tramadol. Discussed risks associated with long-term pain medication usage. Continue current dosage tramadol.

## 2016-08-08 NOTE — Assessment & Plan Note (Signed)
Stable with current medication regimen and no adverse side effects. Attention well controlled. Sleeping adequately. No changes in weight. No cardiac symptoms. [place] controlled substance database reviewed with no irregularities. Continue current dosage of Adderall.

## 2016-08-08 NOTE — Progress Notes (Signed)
Subjective:    Patient ID: Billy Bray, male    DOB: [birth year], 46 y.o.   MRN: [id number]  Chief Complaint  Patient presents with  . Medication Refill    both adderall, tramadol, and triamcinolone, thinks he has a foreign body in his right ear     HPI:  Billy Bray is a 46 y.o. male who  has a past medical history of Allergy; Arthritis; Asthma; Depression; and Kidney stones. and presents today for an office visit.   1.) ADHD - Currently maintained on Adderall and reports taking the medication as prescribed and denies adverse side effects. Concentration and symptoms are generally well controlled with the current dosage of medication. Sleeping approximately 6-8 hours per night. No significant changes to appetite or weight. No headaches, chest pain, shortness of breath, or heart palpitations.   2.) Chronic Knee pain - Currently maintained on Tramadol. Reports taking the medication as prescribed and denies adverse side effects or constipation. Able to work and complete activities of daily living with the current medication regimen and believes pain to be adequately managed.   3.) Sensation of ear - This is a new problem. Associated symptom of a sensation of feelings like a foreign body has been going on for about 1.5 weeks. Denies any changes of hearing, pain, or discharge. Experiencing a headache with a little soreness around his jaw and mild ringing. Chewing he does experience some pain in his jaw/ear.     Allergies  Allergen Reactions  . Amoxicillin Hives      Outpatient Medications Prior to Visit  Medication Sig Dispense Refill  . albuterol (PROVENTIL HFA;VENTOLIN HFA) 108 (90 Base) MCG/ACT inhaler Inhale 1-2 puffs into the lungs every 6 (six) hours as needed for wheezing or shortness of breath. 1 Inhaler 2  . albuterol (PROVENTIL) (2.5 MG/3ML) 0.083% nebulizer solution Take 3 mLs (2.5 mg total) by nebulization every 6 (six) hours as needed for wheezing or shortness of breath. 150  mL 1  . Albuterol Sulfate (PROAIR RESPICLICK) 108 (90 Base) MCG/ACT AEPB Inhale 2 puffs into the lungs every 4 (four) hours as needed. 1 each 2  . cyclobenzaprine (FLEXERIL) 10 MG tablet Take 1 tablet (10 mg total) by mouth 3 (three) times daily as needed for muscle spasms. 30 tablet 1  . gabapentin (NEURONTIN) 300 MG capsule 1 tab po in AM and early afternoon.  3 tabs po qhs 450 capsule 0  . testosterone cypionate (DEPOTESTOSTERONE CYPIONATE) 200 MG/ML injection INJECT 1 ML INTO THE MUSCLE EVERY 14 DAYS 10 mL 0  . amphetamine-dextroamphetamine (ADDERALL XR) 30 MG 24 hr capsule Take 1 capsule (30 mg total) by mouth daily. 30 capsule 0  . amphetamine-dextroamphetamine (ADDERALL) 20 MG tablet Take 1 tablet (20 mg total) by mouth daily as needed. 30 tablet 0  . traMADol (ULTRAM) 50 MG tablet TAKE 1-2 TABLETS BY MOUTH 4 TIMES DAILY AS NEEDED FOR SEVERE PAIN 240 tablet 0  . triamcinolone cream (KENALOG) 0.1 % APPLY TO AFFECTED AREA TWICE A DAY FOR 2 WEEKS 60 g 1   No facility-administered medications prior to visit.       No past surgical history on file.    Past Medical History:  Diagnosis Date  . Allergy   . Arthritis   . Asthma   . Depression   . Kidney stones       Review of Systems  Constitutional: Negative for appetite change, chills, diaphoresis, fatigue, fever and unexpected weight change.  HENT: Positive  for tinnitus. Negative for congestion, ear discharge, ear pain, sinus pain, sinus pressure and sore throat.   Respiratory: Negative for chest tightness and shortness of breath.   Cardiovascular: Negative for chest pain, palpitations and leg swelling.  Musculoskeletal:       Positive for right knee pain.  Neurological: Negative for dizziness, weakness, light-headedness and numbness.  Psychiatric/Behavioral: Negative for decreased concentration and sleep disturbance. The patient is not nervous/anxious and is not hyperactive.       Objective:    BP 136/88 (BP Location:  Left Arm, Patient Position: Sitting, Cuff Size: Large)   Pulse (!) 113   Temp 98.1 F (36.7 C) (Oral)   Resp 16   Ht 5\' 11"  (1.803 m)   Wt 210 lb (95.3 kg)   SpO2 98%   BMI 29.29 kg/m  Nursing note and vital signs reviewed.  Physical Exam  Constitutional: He is oriented to person, place, and time. He appears well-developed and well-nourished. No distress.  HENT:  Right Ear: Hearing, tympanic membrane, external ear and ear canal normal.  Left Ear: Hearing, tympanic membrane, external ear and ear canal normal.  Nose: Nose normal.  Previous tympanostomy tube scar noted.   Cardiovascular: Normal rate, regular rhythm, normal heart sounds and intact distal pulses.   Pulmonary/Chest: Effort normal and breath sounds normal.  Neurological: He is alert and oriented to person, place, and time.  Skin: Skin is warm and dry.  Psychiatric: He has a normal mood and affect. His behavior is normal. Judgment and thought content normal.       Assessment & Plan:   Problem List Items Addressed This Visit      Musculoskeletal and Integument   TMJ dysfunction    Ear pain with symptoms and exam being consistent with TMJ dysfunction. He wears dentures so teeth grinding is nonissue. Recommend conservative treatment with over-the-counter medications as needed for symptom relief and supportive care. Follow-up if symptoms worsen or do not improve.        Other   ADD (attention deficit disorder) - Primary    Stable with current medication regimen and no adverse side effects. Attention well controlled. Sleeping adequately. No changes in weight. No cardiac symptoms. [place] controlled substance database reviewed with no irregularities. Continue current dosage of Adderall.      Knee pain    Chronic knee pain is stable with current dosage of tramadol unable to function and complete his activities of daily living and work. No constipation. Continue current dosage of tramadol. Discussed risks associated  with long-term pain medication usage. Continue current dosage tramadol.      Relevant Medications   traMADol (ULTRAM) 50 MG tablet    Other Visit Diagnoses    Need for Tdap vaccination       Relevant Orders   Tdap vaccine greater than or equal to 7yo IM (Completed)       I am having Billy Bray maintain his albuterol, albuterol, Albuterol Sulfate, testosterone cypionate, gabapentin, cyclobenzaprine, amphetamine-dextroamphetamine, amphetamine-dextroamphetamine, traMADol, and triamcinolone cream.   Meds ordered this encounter  Medications  . DISCONTD: amphetamine-dextroamphetamine (ADDERALL XR) 30 MG 24 hr capsule    Sig: Take 1 capsule (30 mg total) by mouth daily.    Dispense:  30 capsule    Refill:  0    Order Specific Question:   Supervising Provider    Answer:   Hillard Danker A [[id number]]  . DISCONTD: amphetamine-dextroamphetamine (ADDERALL) 20 MG tablet    Sig: Take 1 tablet (20 mg  total) by mouth daily as needed.    Dispense:  30 tablet    Refill:  0    Order Specific Question:   Supervising Provider    Answer:   Hillard Danker A [[id number]]  . DISCONTD: amphetamine-dextroamphetamine (ADDERALL XR) 30 MG 24 hr capsule    Sig: Take 1 capsule (30 mg total) by mouth daily.    Dispense:  30 capsule    Refill:  0    Fill on or after 09/07/16    Order Specific Question:   Supervising Provider    Answer:   Hillard Danker A [[id number]]  . DISCONTD: amphetamine-dextroamphetamine (ADDERALL) 20 MG tablet    Sig: Take 1 tablet (20 mg total) by mouth daily as needed.    Dispense:  30 tablet    Refill:  0    Fill on or after 09/07/16    Order Specific Question:   Supervising Provider    Answer:   Hillard Danker A [[id number]]  . amphetamine-dextroamphetamine (ADDERALL XR) 30 MG 24 hr capsule    Sig: Take 1 capsule (30 mg total) by mouth daily.    Dispense:  30 capsule    Refill:  0    Fill on or after 10/07/16    Order Specific Question:   Supervising Provider    Answer:    Hillard Danker A [[id number]]  . amphetamine-dextroamphetamine (ADDERALL) 20 MG tablet    Sig: Take 1 tablet (20 mg total) by mouth daily as needed.    Dispense:  30 tablet    Refill:  0    Fill on or after 10/07/16    Order Specific Question:   Supervising Provider    Answer:   Hillard Danker A [[id number]]  . traMADol (ULTRAM) 50 MG tablet    Sig: TAKE 1-2 TABLETS BY MOUTH 4 TIMES DAILY AS NEEDED FOR SEVERE PAIN    Dispense:  240 tablet    Refill:  0    Fill on or after 08/17/16    Order Specific Question:   Supervising Provider    Answer:   Hillard Danker A [[id number]]  . triamcinolone cream (KENALOG) 0.1 %    Sig: APPLY TO AFFECTED AREA TWICE A DAY FOR 2 WEEKS    Dispense:  60 g    Refill:  1    Order Specific Question:   Supervising Provider    Answer:   Hillard Danker A [[id number]]     Follow-up: Return in about 3 months (around 11/08/2016), or if symptoms worsen or fail to improve.  Jeanine Luz, FNP

## 2016-08-08 NOTE — Patient Instructions (Signed)
Thank you for choosing [facility].  SUMMARY AND INSTRUCTIONS:  Continue to take your medications as prescribed.   Recommend ibuprofen or Aleve as needed for the jaw.   Medication:  Your prescription(s) have been submitted to your pharmacy or been printed and provided for you. Please take as directed and contact our office if you believe you are having problem(s) with the medication(s) or have any questions.  Follow up:  If your symptoms worsen or fail to improve, please contact our office for further instruction, or in case of emergency go directly to the emergency room at the closest medical facility.   Temporomandibular Joint Syndrome Temporomandibular joint (TMJ) syndrome is a condition that affects the joints between your jaw and your skull. The TMJs are located near your ears and allow your jaw to open and close. These joints and the nearby muscles are involved in all movements of the jaw. People with TMJ syndrome have pain in the area of these joints and muscles. Chewing, biting, or other movements of the jaw can be difficult or painful. TMJ syndrome can be caused by various things. In many cases, the condition is mild and goes away within a few weeks. For some people, the condition can become a long-term problem. What are the causes? Possible causes of TMJ syndrome include:  Grinding your teeth or clenching your jaw. Some people do this when they are under stress.  Arthritis.  Injury to the jaw.  Head or neck injury.  Teeth or dentures that are not aligned well.  In some cases, the cause of TMJ syndrome may not be known. What are the signs or symptoms? The most common symptom is an aching pain on the side of the head in the area of the TMJ. Other symptoms may include:  Pain when moving your jaw, such as when chewing or biting.  Being unable to open your jaw all the way.  Making a clicking sound when you open your mouth.  Headache.  Earache.  Neck or  shoulder pain.  How is this diagnosed? Diagnosis can usually be made based on your symptoms, your medical history, and a physical exam. Your health care provider may check the range of motion of your jaw. Imaging tests, such as X-rays or an MRI, are sometimes done. You may need to see your dentist to determine if your teeth and jaw are lined up correctly. How is this treated? TMJ syndrome often goes away on its own. If treatment is needed, the options may include:  Eating soft foods and applying ice or heat.  Medicines to relieve pain or inflammation.  Medicines to relax the muscles.  A splint, bite plate, or mouthpiece to prevent teeth grinding or jaw clenching.  Relaxation techniques or counseling to help reduce stress.  Transcutaneous electrical nerve stimulation (TENS). This helps to relieve pain by applying an electrical current through the skin.  Acupuncture. This is sometimes helpful to relieve pain.  Jaw surgery. This is rarely needed.  Follow these instructions at home:  Take medicines only as directed by your health care provider.  Eat a soft diet if you are having trouble chewing.  Apply ice to the painful area. ? Put ice in a plastic bag. ? Place a towel between your skin and the bag. ? Leave the ice on for 20 minutes, 2-3 times a day.  Apply a warm compress to the painful area as directed.  Massage your jaw area and perform any jaw stretching exercises as recommended by  your health care provider.  If you were given a mouthpiece or bite plate, wear it as directed.  Avoid foods that require a lot of chewing. Do not chew gum.  Keep all follow-up visits as directed by your health care provider. This is important. Contact a health care provider if:  You are having trouble eating.  You have new or worsening symptoms. Get help right away if:  Your jaw locks open or closed. This information is not intended to replace advice given to you by your health care  provider. Make sure you discuss any questions you have with your health care provider. Document Released: 10/05/2000 Document Revised: 09/10/2015 Document Reviewed: 08/15/2013 Elsevier Interactive Patient Education  [employer].

## 2016-08-08 NOTE — Assessment & Plan Note (Signed)
Ear pain with symptoms and exam being consistent with TMJ dysfunction. He wears dentures so teeth grinding is nonissue. Recommend conservative treatment with over-the-counter medications as needed for symptom relief and supportive care. Follow-up if symptoms worsen or do not improve.

## 2016-08-22 ENCOUNTER — Ambulatory Visit (INDEPENDENT_AMBULATORY_CARE_PROVIDER_SITE_OTHER): Payer: Self-pay

## 2016-08-22 ENCOUNTER — Encounter (INDEPENDENT_AMBULATORY_CARE_PROVIDER_SITE_OTHER): Payer: Self-pay | Admitting: Physical Medicine and Rehabilitation

## 2016-08-22 ENCOUNTER — Ambulatory Visit (INDEPENDENT_AMBULATORY_CARE_PROVIDER_SITE_OTHER): Payer: BLUE CROSS/BLUE SHIELD | Admitting: Physical Medicine and Rehabilitation

## 2016-08-22 VITALS — BP 143/111 | HR 96

## 2016-08-22 DIAGNOSIS — M5412 Radiculopathy, cervical region: Secondary | ICD-10-CM | POA: Diagnosis not present

## 2016-08-22 DIAGNOSIS — M5010 Cervical disc disorder with radiculopathy, unspecified cervical region: Secondary | ICD-10-CM | POA: Diagnosis not present

## 2016-08-22 DIAGNOSIS — M4802 Spinal stenosis, cervical region: Secondary | ICD-10-CM

## 2016-08-22 MED ORDER — METHYLPREDNISOLONE ACETATE 80 MG/ML IJ SUSP
80.0000 mg | Freq: Once | INTRAMUSCULAR | Status: AC
  Administered 2016-08-22: 80 mg

## 2016-08-22 MED ORDER — LIDOCAINE HCL (PF) 1 % IJ SOLN
2.0000 mL | Freq: Once | INTRAMUSCULAR | Status: AC
  Administered 2016-08-22: 2 mL

## 2016-08-22 NOTE — Progress Notes (Unsigned)
Fluoro Time: 23 sec  MGY:8.91

## 2016-08-22 NOTE — Progress Notes (Deleted)
Neck pain for over a year. Gotten worse over time. Typically worse on right side. Numbness in right arm with overhead activity. Works as an Personnel officerelectrician. Right arm weakness.Difficulty laying on right side. Pt has 4 yr hx of right shoulder pain with radiation into the right hand. Pain is worse when turning his head side-to-side. He has tried gabapentin, steroids, Cyclobenzaprine, and therapeutics exercises in the past with some relief. He hasn't noticed any improvement in sx since his last visit. He says that some days are worse than others. Sx are worse if he is "working over head". He has trouble laying on his right side at night.

## 2016-08-22 NOTE — Patient Instructions (Signed)

## 2016-08-24 NOTE — Procedures (Signed)
Mr. Billy Bray is a right-hand-dominant 46 year old gentleman followed by Dr. Berline Choughigby. His history is that he has had 4 year history of chronic shoulder pain but is also had pain radiating down the right hand which is just gotten worse over time and progressively worsened. He does get some left-sided symptoms as well. He gets numbness in his right arm with overhead activity. He works as an Personnel officerelectrician. He has failed conservative care including gabapentin, steroids orally as well as Flexeril and therapeutic exercises. He is getting some relief with exercises but not too great deal and seems to progressively worsen. He has not noticed any focal weakness. He has not had any prior cervical surgery or injections. He has an MRI of the cervical spine which is reviewed below. This includes multilevel spondylosis with some reversal of the lordosis and generalized foraminal narrowing but no significant central canal narrowing or focal stenosis or herniations. Exam today shows good strength in the upper extremities with wrist extension long finger flexion and abduction. He has some impingement sign of the right shoulder. He has some active trigger points in the levator scapula and trapezius area. I think diagnostic of therapeutic cervical epidural injection is warranted. If this is not beneficial he really should try trigger point therapy of nonarteritic completed. He does have a history of bilateral carpal tunnel release.  Cervical Epidural Steroid Injection - Interlaminar Approach with Fluoroscopic Guidance  Patient: Billy Bray      Date of Birth: 05-17-1970 MRN: [id number] PCP: Veryl Speakalone, Gregory D, FNP      Visit Date: 08/22/2016   Universal Protocol:    Date/Time: 08/01/185:50 AM  Consent Given By: the patient  Position: PRONE  Additional Comments: Vital signs were monitored before and after the procedure. Patient was prepped and draped in the usual sterile fashion. The correct patient, procedure, and site was  verified.   Injection Procedure Details:  Procedure Site One Meds Administered:  Meds ordered this encounter  Medications  . lidocaine (PF) (XYLOCAINE) 1 % injection 2 mL  . methylPREDNISolone acetate (DEPO-MEDROL) injection 80 mg     Laterality: Right  Location/Site:  C7-T1  Needle size: 20 G  Needle type: Touhy  Needle Placement: Paramedian epidural space  Findings:  -Contrast Used: 1 mL iohexol 180 mg iodine/mL   -Comments: Excellent flow of contrast into the epidural space.  Procedure Details: Using a paramedian approach from the side mentioned above, the region overlying the inferior lamina was localized under fluoroscopic visualization and the soft tissues overlying this structure were infiltrated with 4 ml. of 1% Lidocaine without Epinephrine. A # 20 gauge, Tuohy needle was inserted into the epidural space using a paramedian approach.  The epidural space was localized using loss of resistance along with lateral and contralateral oblique bi-planar fluoroscopic views.  After negative aspirate for air, blood, and CSF, a 2 ml. volume of Isovue-250 was injected into the epidural space and the flow of contrast was observed. Radiographs were obtained for documentation purposes.   The injectate was administered into the level noted above.  Additional Comments:  The patient tolerated the procedure well Dressing: Band-Aid    Post-procedure details: Patient was observed during the procedure. Post-procedure instructions were reviewed.  Patient left the clinic in stable condition.

## 2016-08-29 ENCOUNTER — Other Ambulatory Visit: Payer: Self-pay | Admitting: Sports Medicine

## 2016-09-13 DIAGNOSIS — M5030 Other cervical disc degeneration, unspecified cervical region: Secondary | ICD-10-CM | POA: Insufficient documentation

## 2016-09-13 NOTE — Assessment & Plan Note (Addendum)
Given the persistent left shoulder and arm pain in the setting of MRI referral to Dr. Naaman Plummer for cervical epidural recommended.  Should continue working on cervical and shoulder range of motion exercises and gentle strengthening but avoid any significant overhead lifting.  Follow-up with him 6 weeks after the injection.

## 2016-09-15 ENCOUNTER — Ambulatory Visit (INDEPENDENT_AMBULATORY_CARE_PROVIDER_SITE_OTHER): Payer: BLUE CROSS/BLUE SHIELD | Admitting: Internal Medicine

## 2016-09-15 ENCOUNTER — Encounter: Payer: Self-pay | Admitting: Internal Medicine

## 2016-09-15 VITALS — BP 142/94 | HR 96 | Temp 98.9°F | Resp 16 | Ht 71.0 in | Wt 210.0 lb

## 2016-09-15 DIAGNOSIS — B356 Tinea cruris: Secondary | ICD-10-CM | POA: Diagnosis not present

## 2016-09-15 MED ORDER — FLUCONAZOLE 100 MG PO TABS: 100.0000 mg | ORAL_TABLET | Freq: Every day | ORAL | 0 refills | Status: AC

## 2016-09-15 NOTE — Patient Instructions (Signed)
Jock Itch Jock itch (tinea cruris) is a fungal infection of the skin in the groin area. It is sometimes called ringworm, even though it is not caused by worms. It is caused by a fungus, which is a type of germ that thrives in dark, damp places. Jock itch causes a rash and itching in the groin and upper thigh area. It usually goes away in 2-3 weeks with treatment. What are the causes? The fungus that causes jock itch may be spread by:  Touching a fungus infection elsewhere on your body-such as athlete's foot-and then touching your groin area.  Sharing towels or clothing with an infected person.  What increases the risk? Jock itch is most common in men and adolescent boys. This condition is more likely to develop from:  Being in hot, humid climates.  Wearing tight-fitting clothing or wet bathing suits for long periods of time.  Participating in sports.  Being overweight.  Having diabetes.  What are the signs or symptoms? Symptoms of jock itch may include:  A red, pink, or brown rash in the groin area. The rash may spread to the thighs, anus, and buttocks.  Dry and scaly skin on or around the rash.  Itchiness.  How is this diagnosed? Most often, a health care provider can make the diagnosis by looking at your rash. Sometimes, a scraping of the infected skin will be taken. This sample may be tested by looking at it under a microscope or by trying to grow the fungus from the sample (culture). How is this treated? Treatment for this condition may include:  Antifungal medicine to kill the fungus. This may be in various forms: ? Skin cream or ointment. ? Medicine taken by mouth.  Skin cream or ointment to reduce the itching.  Compresses or medicated powders to dry the infected skin.  Follow these instructions at home:  Take medicines only as directed by your health care provider. Apply skin creams or ointments exactly as directed.  Wear loose-fitting clothing. ? Men should  wear cotton boxer shorts. ? Women should wear cotton underwear.  Change your underwear every day to keep your groin dry.  Avoid hot baths.  Dry your groin area well after bathing. ? Use a separate towel to dry your groin area. This will help to prevent a spreading of the infection to other areas of your body.  Do not scratch the affected area.  Do not share towels with other people. Contact a health care provider if:  Your rash does not improve or it gets worse after 2 weeks of treatment.  Your rash is spreading.  Your rash returns after treatment is finished.  You have a fever.  You have redness, swelling, or pain in the area around your rash.  You have fluid, blood, or pus coming from your rash.  Your have your rash for more than 4 weeks. This information is not intended to replace advice given to you by your health care provider. Make sure you discuss any questions you have with your health care provider. Document Released: 12/31/2001 Document Revised: 06/18/2015 Document Reviewed: 10/22/2013 Elsevier Interactive Patient Education  2018 Elsevier Inc.  

## 2016-09-15 NOTE — Progress Notes (Signed)
Subjective:  Patient ID: Billy Bray, male    DOB: 14-Sep-1970  Age: 46 y.o. MRN: [id number]  CC: Rash   HPI TARRIS DELBENE presents for a one-month history of worsening groin rash. He says the rash started on the left side is now spread to the right side. He has tried to treat it with topical applications of zinc oxide and steroids with no improvement. He complains that the rash feels irritated and painful but does not itch. He is also noticed a few pustules and blisters. He says the rash has spread from the groin over to his scrotum and up into the suprapubic region.  Outpatient Medications Prior to Visit  Medication Sig Dispense Refill  . albuterol (PROVENTIL HFA;VENTOLIN HFA) 108 (90 Base) MCG/ACT inhaler Inhale 1-2 puffs into the lungs every 6 (six) hours as needed for wheezing or shortness of breath. 1 Inhaler 2  . albuterol (PROVENTIL) (2.5 MG/3ML) 0.083% nebulizer solution Take 3 mLs (2.5 mg total) by nebulization every 6 (six) hours as needed for wheezing or shortness of breath. 150 mL 1  . Albuterol Sulfate (PROAIR RESPICLICK) 108 (90 Base) MCG/ACT AEPB Inhale 2 puffs into the lungs every 4 (four) hours as needed. 1 each 2  . amphetamine-dextroamphetamine (ADDERALL XR) 30 MG 24 hr capsule Take 1 capsule (30 mg total) by mouth daily. 30 capsule 0  . amphetamine-dextroamphetamine (ADDERALL) 20 MG tablet Take 1 tablet (20 mg total) by mouth daily as needed. 30 tablet 0  . cyclobenzaprine (FLEXERIL) 10 MG tablet Take 1 tablet (10 mg total) by mouth 3 (three) times daily as needed for muscle spasms. 30 tablet 1  . gabapentin (NEURONTIN) 300 MG capsule 1 tab po in AM and early afternoon.  3 tabs po qhs 450 capsule 0  . testosterone cypionate (DEPOTESTOSTERONE CYPIONATE) 200 MG/ML injection INJECT 1 ML INTO THE MUSCLE EVERY 14 DAYS 10 mL 0  . traMADol (ULTRAM) 50 MG tablet TAKE 1-2 TABLETS BY MOUTH 4 TIMES DAILY AS NEEDED FOR SEVERE PAIN 240 tablet 0  . triamcinolone cream (KENALOG) 0.1 %  APPLY TO AFFECTED AREA TWICE A DAY FOR 2 WEEKS 60 g 1   No facility-administered medications prior to visit.     ROS Review of Systems  Constitutional: Negative for chills, fatigue and fever.  HENT: Negative.  Negative for sore throat and trouble swallowing.   Eyes: Negative.  Negative for visual disturbance.  Respiratory: Negative.  Negative for cough, chest tightness, wheezing and stridor.   Cardiovascular: Negative for chest pain, palpitations and leg swelling.  Gastrointestinal: Negative for abdominal pain, constipation, diarrhea, nausea and vomiting.  Endocrine: Negative.   Genitourinary: Negative.  Negative for difficulty urinating, dysuria, genital sores, hematuria, penile swelling, scrotal swelling, testicular pain and urgency.  Musculoskeletal: Negative.  Negative for back pain.  Skin: Positive for color change and rash. Negative for pallor and wound.  Allergic/Immunologic: Negative.   Neurological: Negative.   Hematological: Negative for adenopathy. Does not bruise/bleed easily.  Psychiatric/Behavioral: Negative.     Objective:  BP (!) 142/94 (BP Location: Left Arm, Patient Position: Sitting, Cuff Size: Large)   Pulse 96   Temp 98.9 F (37.2 C) (Oral)   Resp 16   Ht 5\' 11"  (1.803 m)   Wt 210 lb (95.3 kg)   SpO2 98%   BMI 29.29 kg/m   BP Readings from Last 3 Encounters:  09/15/16 (!) 142/94  08/22/16 (!) 143/111  08/08/16 136/88    Wt Readings from Last 3 Encounters:  09/15/16 210 lb (95.3 kg)  08/08/16 210 lb (95.3 kg)  08/05/16 211 lb 3.2 oz (95.8 kg)    Physical Exam  Genitourinary: Testes normal and penis normal. Right testis shows no mass, no swelling and no tenderness. Right testis is descended. Left testis shows no mass and no swelling. Left testis is descended. Circumcised. No penile erythema or penile tenderness. No discharge found.  Genitourinary Comments: There is erythema and maceration that extends from the folds onto the scrotum. There are  multiple satellite lesions around the groin and suprapubic region that are erythematous and excoriated. There are no vesicles.  Lymphadenopathy:       Right: No inguinal adenopathy present.       Left: No inguinal adenopathy present.    Lab Results  Component Value Date   WBC 8.1 05/24/2016   HGB 15.7 05/24/2016   HCT 45.8 05/24/2016   PLT 229.0 05/24/2016   GLUCOSE 104 (H) 05/11/2016   ALT 17 05/11/2016   AST 14 05/11/2016   NA 136 05/11/2016   K 4.0 05/11/2016   CL 102 05/11/2016   CREATININE 0.97 05/11/2016   BUN 16 05/11/2016   CO2 28 05/11/2016   PSA 0.85 12/08/2015   INR 0.86 05/25/2009    Mr Cervical Spine Wo Contrast  Result Date: 06/05/2016 CLINICAL DATA:  Right-sided neck pain and shoulder pain for 1 year. EXAM: MRI CERVICAL SPINE WITHOUT CONTRAST TECHNIQUE: Multiplanar, multisequence MR imaging of the cervical spine was performed. No intravenous contrast was administered. COMPARISON:  Cervical spine radiographs 05/11/2016 FINDINGS: Alignment: Mild reversal of the normal cervical lordosis, stable when compared to prior plain films. Age advanced degenerative cervical spondylosis but the overall alignment is maintained. Vertebrae: No bone lesions or fracture. Mild endplate reactive changes at C5-6. Cord: Normal Posterior Fossa, vertebral arteries, paraspinal tissues: No significant findings Disc levels: C2-3:  No significant findings. C3-4: Mild bulging annulus, osteophytic ridging and uncinate spurring. There is mild flattening of the ventral thecal sac and narrowing of the ventral CSF space. Right-sided disc osteophyte complex with mild right foraminal stenosis. Mild left foraminal stenosis also. C4-5: Bulging annulus, osteophytic ridging and uncinate spurring with flattening of the ventral thecal sac and moderate narrowing of the ventral CSF space. Mild foraminal narrowing bilaterally also. C5-6: Bulging annulus, osteophytic ridging and uncinate spurring. Bilateral disc  osteophyte complexes with moderate to significant bilateral foraminal stenosis, left greater than right. This also flattening of the ventral thecal sac and near obliteration of the ventral CSF space. C6-7:  No significant findings. C7-T1:  No significant findings. IMPRESSION: 1. Degenerative cervical spondylosis the with multilevel disc disease and facet disease. Moderate reversal of the normal cervical lordosis. 2. Bulging discs, osteophytic ridging and uncinate spurring at C3-4, C4-5 and C5-6 with spinal and foraminal stenosis as detailed above. The most significant level is C5-6. Electronically Signed   By: Rudie Meyer M.D.   On: 06/05/2016 16:45    Assessment & Plan:   Darlene was seen today for rash.  Diagnoses and all orders for this visit:  Tinea cruris- he has a significant infection so will treat with a 2 week course of fluconazole. I've asked him to stop using the topical applications. He is encouraged to apply cool compresses and otherwise leave the areas uncovered. -     fluconazole (DIFLUCAN) 100 MG tablet; Take 1 tablet (100 mg total) by mouth daily.   I am having Mr. Archey start on fluconazole. I am also having him maintain his albuterol, albuterol,  Albuterol Sulfate, testosterone cypionate, gabapentin, cyclobenzaprine, amphetamine-dextroamphetamine, amphetamine-dextroamphetamine, traMADol, and triamcinolone cream.  Meds ordered this encounter  Medications  . fluconazole (DIFLUCAN) 100 MG tablet    Sig: Take 1 tablet (100 mg total) by mouth daily.    Dispense:  14 tablet    Refill:  0     Follow-up: Return in about 3 weeks (around 10/06/2016).  Sanda Linger, MD

## 2016-09-27 ENCOUNTER — Telehealth: Payer: Self-pay

## 2016-09-27 DIAGNOSIS — M25561 Pain in right knee: Principal | ICD-10-CM

## 2016-09-27 DIAGNOSIS — G8929 Other chronic pain: Secondary | ICD-10-CM

## 2016-09-27 NOTE — Telephone Encounter (Signed)
Received fax from [facility] [address] requesting a new rx for tramadol. Last refill was 08/27/16 per [place] [facility].

## 2016-09-28 MED ORDER — TRAMADOL HCL 50 MG PO TABS: ORAL_TABLET | ORAL | 0 refills | Status: DC

## 2016-09-28 NOTE — Telephone Encounter (Signed)
Medication printed to be faxed.  

## 2016-09-29 ENCOUNTER — Other Ambulatory Visit: Payer: Self-pay | Admitting: Family

## 2016-09-29 NOTE — Telephone Encounter (Signed)
Rx faxed

## 2016-10-03 DIAGNOSIS — H5213 Myopia, bilateral: Secondary | ICD-10-CM | POA: Diagnosis not present

## 2016-10-20 ENCOUNTER — Other Ambulatory Visit: Payer: Self-pay | Admitting: Sports Medicine

## 2016-10-20 DIAGNOSIS — M542 Cervicalgia: Secondary | ICD-10-CM

## 2016-10-30 ENCOUNTER — Other Ambulatory Visit: Payer: Self-pay | Admitting: Sports Medicine

## 2016-10-30 DIAGNOSIS — M542 Cervicalgia: Secondary | ICD-10-CM

## 2016-10-31 ENCOUNTER — Other Ambulatory Visit: Payer: Self-pay | Admitting: Family

## 2016-10-31 DIAGNOSIS — G8929 Other chronic pain: Secondary | ICD-10-CM

## 2016-10-31 DIAGNOSIS — M25561 Pain in right knee: Principal | ICD-10-CM

## 2016-11-02 NOTE — Telephone Encounter (Signed)
Last refill was 09/29/16 per [place] [facility].

## 2016-11-03 NOTE — Telephone Encounter (Signed)
Rx faxed

## 2016-11-04 ENCOUNTER — Other Ambulatory Visit: Payer: Self-pay | Admitting: Family

## 2016-11-04 MED ORDER — AMPHETAMINE-DEXTROAMPHETAMINE 20 MG PO TABS: 20.0000 mg | ORAL_TABLET | Freq: Every day | ORAL | 0 refills | Status: DC | PRN

## 2016-11-04 MED ORDER — AMPHETAMINE-DEXTROAMPHET ER 30 MG PO CP24
30.0000 mg | ORAL_CAPSULE | Freq: Every day | ORAL | 0 refills | Status: DC
Start: 2016-11-04 — End: 2017-01-03

## 2016-11-04 MED ORDER — AMPHETAMINE-DEXTROAMPHET ER 30 MG PO CP24
30.0000 mg | ORAL_CAPSULE | Freq: Every day | ORAL | 0 refills | Status: DC
Start: 2016-11-04 — End: 2016-11-04

## 2016-11-11 ENCOUNTER — Ambulatory Visit (INDEPENDENT_AMBULATORY_CARE_PROVIDER_SITE_OTHER): Payer: BLUE CROSS/BLUE SHIELD | Admitting: Nurse Practitioner

## 2016-11-11 ENCOUNTER — Encounter: Payer: Self-pay | Admitting: Nurse Practitioner

## 2016-11-11 VITALS — BP 126/88 | HR 89 | Temp 98.0°F | Resp 16 | Ht 71.0 in | Wt 220.0 lb

## 2016-11-11 DIAGNOSIS — F988 Other specified behavioral and emotional disorders with onset usually occurring in childhood and adolescence: Secondary | ICD-10-CM | POA: Diagnosis not present

## 2016-11-11 NOTE — Patient Instructions (Addendum)
I have placed the referral for ADD testing. Our office will call you to schedule that appointment.   Id like to see you back at your convenience for an annual physical.  It was nice to meet you. Thanks for letting me take care of you today :)

## 2016-11-11 NOTE — Progress Notes (Signed)
Subjective:    Patient ID: Billy Bray, male    DOB: [birth year], 46 y.o.   MRN: [id number]  HPI Billy Bray is a 46 yo male who presents today to establish care. He is transferring to me from another provider in the same clinic.  ADD-  Maintained adderal XR 30mg  daily in the morning, adderall 20mg  tablets daily in the afternoon. He occasionally skips the afternoon dose if he feels he doesn't need that. He denies any adverse effects related to the adderall and feels well today. He was diagnosed by a prior doctor and started on adderal about 5-6 years ago. He's felt his symptoms have been well controlled since. He's never been formally tested for ADD and is open to have this testing.  Review of Systems  See HPI  Past Medical History:  Diagnosis Date  . Allergy   . Arthritis   . Asthma   . Depression   . Kidney stones      Social History   Social History  . Marital status: Divorced    Spouse name: N/A  . Number of children: 2  . Years of education: 14   Occupational History  . Electrician    Social History Main Topics  . Smoking status: Former Games developermoker  . Smokeless tobacco: Never Used  . Alcohol use No  . Drug use: Yes    Types: Marijuana  . Sexual activity: Not Currently   Other Topics Concern  . Not on file   Social History Narrative   Fun: Entertainment sounds/lights for concerts,   Denies religious beliefs effecting health care.     History reviewed. No pertinent surgical history.  Family History  Problem Relation Age of Onset  . Healthy Mother   . Healthy Father   . Stroke Maternal Grandmother   . Diabetes Maternal Grandmother   . Stroke Maternal Grandfather   . Diabetes Maternal Grandfather   . Colon cancer Paternal Grandfather     Allergies  Allergen Reactions  . Amoxicillin Hives    Current Outpatient Prescriptions on File Prior to Visit  Medication Sig Dispense Refill  . albuterol (PROVENTIL HFA;VENTOLIN HFA) 108 (90 Base) MCG/ACT inhaler  Inhale 1-2 puffs into the lungs every 6 (six) hours as needed for wheezing or shortness of breath. 1 Inhaler 2  . albuterol (PROVENTIL) (2.5 MG/3ML) 0.083% nebulizer solution Take 3 mLs (2.5 mg total) by nebulization every 6 (six) hours as needed for wheezing or shortness of breath. 150 mL 1  . Albuterol Sulfate (PROAIR RESPICLICK) 108 (90 Base) MCG/ACT AEPB Inhale 2 puffs into the lungs every 4 (four) hours as needed. 1 each 2  . amphetamine-dextroamphetamine (ADDERALL XR) 30 MG 24 hr capsule Take 1 capsule (30 mg total) by mouth daily. 30 capsule 0  . amphetamine-dextroamphetamine (ADDERALL) 20 MG tablet Take 1 tablet (20 mg total) by mouth daily as needed. 30 tablet 0  . cyclobenzaprine (FLEXERIL) 10 MG tablet Take 1 tablet (10 mg total) by mouth 3 (three) times daily as needed for muscle spasms. 30 tablet 1  . gabapentin (NEURONTIN) 300 MG capsule TAKE ONE CAPSULE EVERY MORNING AND EARLY AFTERNOON. TAKE 3 CAPSULES AT BEDTIME 450 capsule 0  . testosterone cypionate (DEPOTESTOSTERONE CYPIONATE) 200 MG/ML injection INJECT 1 ML INTO THE MUSCLE EVERY 14 DAYS 10 mL 0  . traMADol (ULTRAM) 50 MG tablet TAKE 1 TO 2 TABLETS BY MOUTH 4 TIMES A DAY AS NEEDED FOR SEVERE PAIN 240 tablet 0  . triamcinolone cream (KENALOG) 0.1 %  APPLY TO AFFECTED AREA TWICE A DAY FOR 2 WEEKS 60 g 1   No current facility-administered medications on file prior to visit.     BP 126/88 (BP Location: Left Arm, Patient Position: Sitting, Cuff Size: Large)   Pulse 89   Temp 98 F (36.7 C) (Oral)   Resp 16   Ht 5\' 11"  (1.803 m)   Wt 220 lb (99.8 kg)   SpO2 98%   BMI 30.68 kg/m       Objective:   Physical Exam  Constitutional: He is oriented to person, place, and time. He appears well-developed and well-nourished. No distress.  Cardiovascular: Normal rate and intact distal pulses.   No murmur heard. Pulmonary/Chest: Effort normal and breath sounds normal. No respiratory distress.  Neurological: He is alert and oriented  to person, place, and time.  Skin: Skin is warm and dry.  Psychiatric: He has a normal mood and affect. His behavior is normal. Judgment and thought content normal.       Assessment & Plan:

## 2016-11-11 NOTE — Assessment & Plan Note (Signed)
Maintained on adderall XR 30mg  in the morning, adderall 20mg  in the afternoon daily. He reports good control of his symptoms. He was diagnosed 5-6 years ago by a test in his family doctors office but never had formal testing. We discussed formal testing which he was open to. Referral placed.

## 2016-12-01 ENCOUNTER — Telehealth: Payer: Self-pay | Admitting: *Deleted

## 2016-12-01 NOTE — Telephone Encounter (Signed)
Rec'd fax pt requesting refill on Testosterone cyp 200 mg/ml inject 1 ml intramuscular every 14 days. Last filled 09/24/16...Raechel Chute/lmb

## 2016-12-01 NOTE — Telephone Encounter (Signed)
Okay to refill? 

## 2016-12-06 ENCOUNTER — Other Ambulatory Visit: Payer: Self-pay

## 2016-12-06 DIAGNOSIS — G8929 Other chronic pain: Secondary | ICD-10-CM

## 2016-12-06 DIAGNOSIS — M25561 Pain in right knee: Principal | ICD-10-CM

## 2016-12-06 MED ORDER — TESTOSTERONE CYPIONATE 200 MG/ML IM SOLN: INTRAMUSCULAR | 0 refills | Status: DC

## 2016-12-06 MED ORDER — TRAMADOL HCL 50 MG PO TABS: ORAL_TABLET | ORAL | 0 refills | Status: DC

## 2016-12-06 NOTE — Telephone Encounter (Signed)
[facility] [address] for [facility]ill of tramadol. Last refill was 11/03/16 [place] [facility]. Quantity was 240. Please advise.

## 2016-12-06 NOTE — Telephone Encounter (Signed)
I have sent a prescription for one 50mg  tablet up to 4 times a day as needed for severe pain. If he is experiencing severe pain daily and feels he needs more than this, please have him schedule an appointment to talk about his pain.

## 2016-12-06 NOTE — Telephone Encounter (Signed)
Printed script, Designer, television/film setAshleigh signed, [facility]../l[facility]

## 2016-12-07 MED ORDER — TRAMADOL HCL 50 MG PO TABS: ORAL_TABLET | ORAL | 0 refills | Status: DC

## 2016-12-07 NOTE — Addendum Note (Signed)
Addended by: Mercer PodWRENN, Marcus Schwandt E on: 12/07/2016 12:16 PM   Modules accepted: Orders

## 2016-12-07 NOTE — Telephone Encounter (Signed)
Pt is aware of the cut back on medication.

## 2016-12-30 ENCOUNTER — Telehealth: Payer: Self-pay | Admitting: Nurse Practitioner

## 2016-12-30 DIAGNOSIS — Z79899 Other long term (current) drug therapy: Secondary | ICD-10-CM

## 2016-12-30 NOTE — Telephone Encounter (Signed)
Copied from CRM [phone]. Topic: Inquiry >> Dec 30, 2016 12:06 PM Yvonna Alanisobinson, Andra M wrote: Reason for CRM: Patient called in wanting a refill of Adderall. Patient stated that he will come in to pick up the prescription once it is ready. Patient also stated that he cannot afford to pay for the ADD test that he and Morrie Sheldonshley discussed, and will have to cancel it.

## 2017-01-03 MED ORDER — AMPHETAMINE-DEXTROAMPHET ER 30 MG PO CP24: 30.0000 mg | ORAL_CAPSULE | Freq: Every day | ORAL | 0 refills | Status: DC

## 2017-01-03 MED ORDER — AMPHETAMINE-DEXTROAMPHET ER 30 MG PO CP24
30.0000 mg | ORAL_CAPSULE | Freq: Every day | ORAL | 0 refills | Status: DC
Start: 2017-01-03 — End: 2017-01-03

## 2017-01-03 MED ORDER — AMPHETAMINE-DEXTROAMPHETAMINE 20 MG PO TABS: 20.0000 mg | ORAL_TABLET | Freq: Every day | ORAL | 0 refills | Status: DC | PRN

## 2017-01-03 NOTE — Telephone Encounter (Signed)
Refill ADDERALL

## 2017-01-03 NOTE — Telephone Encounter (Signed)
This encounter was created in error - please disregard.

## 2017-01-04 NOTE — Telephone Encounter (Signed)
Pt aware of response below. He is aware to have a UDS done by the end of the week.

## 2017-01-09 ENCOUNTER — Telehealth: Payer: Self-pay | Admitting: Nurse Practitioner

## 2017-01-09 DIAGNOSIS — M25561 Pain in right knee: Secondary | ICD-10-CM

## 2017-01-09 DIAGNOSIS — G8929 Other chronic pain: Secondary | ICD-10-CM

## 2017-01-09 DIAGNOSIS — M542 Cervicalgia: Secondary | ICD-10-CM

## 2017-01-09 NOTE — Telephone Encounter (Signed)
Check [place] registry last filled 12/07/2016 pls advise...Raechel Chute/lmb

## 2017-01-09 NOTE — Telephone Encounter (Signed)
Copied from CRM #[id number]. Topic: Quick Communication - See Telephone Encounter >> Jan 09, 2017  8:55 AM Billy Bray, Billy Bray, NT wrote: CRM for notification. See Telephone encounter for: Patient calling because he needs a refill on his Tramodol and Gabapentin. If someone could please give him a call back about this at [phone]  01/09/17.

## 2017-01-09 NOTE — Telephone Encounter (Signed)
Pt  Requesting  Refill   Of  tramadol  And  gabepentin

## 2017-01-10 ENCOUNTER — Other Ambulatory Visit: Payer: BLUE CROSS/BLUE SHIELD

## 2017-01-10 DIAGNOSIS — Z79899 Other long term (current) drug therapy: Secondary | ICD-10-CM

## 2017-01-10 NOTE — Telephone Encounter (Signed)
Pt calling to check on med status

## 2017-01-10 NOTE — Telephone Encounter (Signed)
Advised pt needed to give a UDS prior to refills. Pt understood and stated he would be by today.

## 2017-01-10 NOTE — Telephone Encounter (Signed)
Pt came in today to give UDS. Pt asking if you can send tramadol and gabapentin to pharmacy.

## 2017-01-11 ENCOUNTER — Telehealth: Payer: Self-pay

## 2017-01-11 ENCOUNTER — Telehealth: Payer: Self-pay | Admitting: Nurse Practitioner

## 2017-01-11 MED ORDER — TRAMADOL HCL 50 MG PO TABS: ORAL_TABLET | ORAL | 0 refills | Status: DC

## 2017-01-11 MED ORDER — GABAPENTIN 300 MG PO CAPS: ORAL_CAPSULE | ORAL | 2 refills | Status: DC

## 2017-01-11 NOTE — Telephone Encounter (Signed)
Duplicate msg... See previous msg that was taken...Raechel Chute/lmb

## 2017-01-11 NOTE — Telephone Encounter (Signed)
Can we please ask him to get an appointment to discuss pain medications prior to next refill of tramadol? He is taking a large amount of tramadol and id like for us to talk about options that could be safer and more effective for his pain.

## 2017-01-11 NOTE — Telephone Encounter (Signed)
Copied from CRM #[id number]. Topic: General - Other °>> Jan 11, 2017  9:15 AM Golden, Tashia, RMA wrote: °Reason for CRM: pt called to have gabapentin 300 mg and tramadol 50 mg sent to pharmacy  ° °

## 2017-01-11 NOTE — Telephone Encounter (Signed)
Copied from CRM [phone]#[id number]. Topic: General - Other >> Jan 11, 2017  9:15 AM Lelon FrohlichGolden, Tashia, RMA wrote: Reason for CRM: pt called to have gabapentin 300 mg and tramadol 50 mg sent to pharmacy

## 2017-01-11 NOTE — Telephone Encounter (Signed)
Refill with note sent

## 2017-01-11 NOTE — Addendum Note (Signed)
Addended by: Evaristo BurySHAMBLEY, ASHLEIGH N on: 01/11/2017 11:23 AM   Modules accepted: Orders

## 2017-01-12 NOTE — Telephone Encounter (Signed)
Pt aware.

## 2017-01-13 LAB — PAIN MGMT, PROFILE 8 W/CONF, U
6 Acetylmorphine: NEGATIVE ng/mL (ref <10)
Alcohol Metabolites: NEGATIVE ng/mL (ref <500)
Amphetamine: 6854 ng/mL — ABNORMAL HIGH (ref <250)
Amphetamines: POSITIVE ng/mL — AB (ref <500)
Benzodiazepines: NEGATIVE ng/mL (ref <100)
Buprenorphine, Urine: NEGATIVE ng/mL (ref <5)
Cocaine Metabolite: NEGATIVE ng/mL (ref <150)
Creatinine: 92.1 mg/dL
MDMA: NEGATIVE ng/mL (ref <500)
Marijuana Metabolite: 479 ng/mL — ABNORMAL HIGH (ref <5)
Marijuana Metabolite: POSITIVE ng/mL — AB (ref <20)
Methamphetamine: NEGATIVE ng/mL (ref <250)
Opiates: NEGATIVE ng/mL (ref <100)
Oxidant: NEGATIVE ug/mL (ref <200)
Oxycodone: NEGATIVE ng/mL (ref <100)
pH: 6.89 (ref 4.5–9.0)

## 2017-01-21 ENCOUNTER — Encounter: Payer: Self-pay | Admitting: Nurse Practitioner

## 2017-01-21 DIAGNOSIS — M25561 Pain in right knee: Principal | ICD-10-CM

## 2017-01-21 DIAGNOSIS — G8929 Other chronic pain: Secondary | ICD-10-CM

## 2017-01-23 ENCOUNTER — Ambulatory Visit: Payer: BLUE CROSS/BLUE SHIELD | Admitting: Psychology

## 2017-01-23 MED ORDER — TRAMADOL HCL 50 MG PO TABS: ORAL_TABLET | ORAL | 0 refills | Status: DC

## 2017-01-31 ENCOUNTER — Other Ambulatory Visit: Payer: Self-pay | Admitting: Nurse Practitioner

## 2017-01-31 NOTE — Telephone Encounter (Signed)
Last refill on adderall was 01/03/17 per database.

## 2017-01-31 NOTE — Telephone Encounter (Signed)
Copied from CRM [phone]. Topic: Quick Communication - Rx Refill/Question >> Jan 31, 2017 12:46 PM Stephannie LiSimmons, Aleck Locklin L, NT wrote: Has the patient contacted their pharmacy? {yes  (Agent: If no, request that the patient contact the pharmacy for the refill.) Preferred [facility] (with phone number [facility] [address] Agent: Please be advised that RX refills may take up to 3 business days. We ask that you follow-up with your pharmacy. Please refill adderall 20 mg and the 30 mg , testosterone cypionate 200 mg injection

## 2017-01-31 NOTE — Telephone Encounter (Signed)
Routed back to provider 

## 2017-02-02 MED ORDER — AMPHETAMINE-DEXTROAMPHET ER 30 MG PO CP24: 30.0000 mg | ORAL_CAPSULE | Freq: Every day | ORAL | 0 refills | Status: DC

## 2017-02-02 MED ORDER — AMPHETAMINE-DEXTROAMPHETAMINE 20 MG PO TABS: 20.0000 mg | ORAL_TABLET | Freq: Every day | ORAL | 0 refills | Status: DC | PRN

## 2017-02-02 NOTE — Telephone Encounter (Signed)
adderall 20 mg and 30 mg sent. Can we please find out how he is taking the testosterone-he should have about 1 month supply left? I want to make sure I know how he is taking it?

## 2017-02-03 NOTE — Telephone Encounter (Signed)
Called pt w/Billy Bray response. Pt states directions are correct on med list. They did not give him a 10 ml vial. They only gave him 2 of the small vial which was for Dec..../lmb

## 2017-02-04 NOTE — Telephone Encounter (Signed)
Can we please call the pharmacy to find out how many mL were in the vials they gave him? Since this is a controlled substance I want to be sure Im not giving too soon

## 2017-02-06 NOTE — Telephone Encounter (Signed)
[facility] spoke [facility] Tame[facility] she stated on 12/06/16 pt was give (4) 1 ml vials. Pt had request another refill but insurance denied, and is requiring a prior authorization...Raechel Chute/lmb

## 2017-02-07 NOTE — Telephone Encounter (Signed)
Can we send the prior auth? Also, can we find out if there is a specific quantity that will be covered by his insurance- I can change prescription to this quantity if needed.

## 2017-02-09 ENCOUNTER — Encounter: Payer: Self-pay | Admitting: *Deleted

## 2017-02-09 NOTE — Telephone Encounter (Signed)
Chartered certified accountantAshleigh assistant received PA. I have completed thru cover-my-meds. Waiting on insurance approval status...Raechel Chute/lmb

## 2017-02-13 NOTE — Telephone Encounter (Signed)
PA has been approved for testosterone cypionate from 02/09/2017-01/23/2038. Pharmacy has been informed.

## 2017-03-03 ENCOUNTER — Other Ambulatory Visit: Payer: Self-pay | Admitting: Nurse Practitioner

## 2017-03-03 DIAGNOSIS — M25561 Pain in right knee: Principal | ICD-10-CM

## 2017-03-03 DIAGNOSIS — G8929 Other chronic pain: Secondary | ICD-10-CM

## 2017-03-03 NOTE — Telephone Encounter (Signed)
Check [place] registry last filled 02/03/2017.Marland Kitchen.Raechel Chute/lmb

## 2017-03-05 MED ORDER — AMPHETAMINE-DEXTROAMPHET ER 30 MG PO CP24: 30.0000 mg | ORAL_CAPSULE | Freq: Every day | ORAL | 0 refills | Status: DC

## 2017-03-05 MED ORDER — TRAMADOL HCL 50 MG PO TABS: ORAL_TABLET | ORAL | 0 refills | Status: DC

## 2017-03-05 MED ORDER — AMPHETAMINE-DEXTROAMPHETAMINE 20 MG PO TABS: 20.0000 mg | ORAL_TABLET | Freq: Every day | ORAL | 0 refills | Status: DC | PRN

## 2017-03-31 ENCOUNTER — Other Ambulatory Visit: Payer: Self-pay | Admitting: Nurse Practitioner

## 2017-03-31 DIAGNOSIS — G8929 Other chronic pain: Secondary | ICD-10-CM

## 2017-03-31 DIAGNOSIS — M25561 Pain in right knee: Principal | ICD-10-CM

## 2017-03-31 MED ORDER — TRAMADOL HCL 50 MG PO TABS: ORAL_TABLET | ORAL | 0 refills | Status: DC

## 2017-03-31 MED ORDER — AMPHETAMINE-DEXTROAMPHET ER 30 MG PO CP24: 30.0000 mg | ORAL_CAPSULE | Freq: Every day | ORAL | 0 refills | Status: DC

## 2017-03-31 MED ORDER — TESTOSTERONE CYPIONATE 200 MG/ML IM SOLN: INTRAMUSCULAR | 0 refills | Status: DC

## 2017-03-31 MED ORDER — AMPHETAMINE-DEXTROAMPHETAMINE 20 MG PO TABS: 20.0000 mg | ORAL_TABLET | Freq: Every day | ORAL | 0 refills | Status: DC | PRN

## 2017-03-31 NOTE — Telephone Encounter (Signed)
Last refill on tramadol and adderall was 03/07/17 per database Last refill of testosterone was 02/13/17.

## 2017-04-06 ENCOUNTER — Other Ambulatory Visit: Payer: Self-pay | Admitting: Family

## 2017-04-27 ENCOUNTER — Encounter: Payer: Self-pay | Admitting: Nurse Practitioner

## 2017-04-28 MED ORDER — TRIAMCINOLONE ACETONIDE 0.1 % EX CREA
TOPICAL_CREAM | CUTANEOUS | 1 refills | Status: DC
Start: 2017-04-28 — End: 2017-07-24

## 2017-05-01 ENCOUNTER — Other Ambulatory Visit: Payer: Self-pay | Admitting: Nurse Practitioner

## 2017-05-01 DIAGNOSIS — M25561 Pain in right knee: Principal | ICD-10-CM

## 2017-05-01 DIAGNOSIS — G8929 Other chronic pain: Secondary | ICD-10-CM

## 2017-05-01 MED ORDER — TRAMADOL HCL 50 MG PO TABS: ORAL_TABLET | ORAL | 0 refills | Status: DC

## 2017-05-01 MED ORDER — AMPHETAMINE-DEXTROAMPHETAMINE 20 MG PO TABS: 20.0000 mg | ORAL_TABLET | Freq: Every day | ORAL | 0 refills | Status: DC | PRN

## 2017-05-01 MED ORDER — AMPHETAMINE-DEXTROAMPHET ER 30 MG PO CP24: 30.0000 mg | ORAL_CAPSULE | Freq: Every day | ORAL | 0 refills | Status: DC

## 2017-05-01 NOTE — Telephone Encounter (Signed)
All scripts last filled 04/03/2017 Both adderall scripts 30# Tramadol 120#

## 2017-05-01 NOTE — Telephone Encounter (Signed)
Done erx 

## 2017-05-29 ENCOUNTER — Other Ambulatory Visit: Payer: Self-pay | Admitting: Internal Medicine

## 2017-05-29 DIAGNOSIS — G8929 Other chronic pain: Secondary | ICD-10-CM

## 2017-05-29 DIAGNOSIS — M25561 Pain in right knee: Principal | ICD-10-CM

## 2017-05-29 NOTE — Telephone Encounter (Signed)
Check [place] registry last filled Adderall XR last filled 05/04/17 Adderall 20 mg and tramadol last filled 05/01/17. Will hold until Ashleigh return back in the office on tomorrow.Marland KitchenRaechel Chute

## 2017-05-30 ENCOUNTER — Other Ambulatory Visit: Payer: Self-pay | Admitting: Nurse Practitioner

## 2017-05-30 DIAGNOSIS — G8929 Other chronic pain: Secondary | ICD-10-CM

## 2017-05-30 DIAGNOSIS — M25561 Pain in right knee: Principal | ICD-10-CM

## 2017-05-30 MED ORDER — AMPHETAMINE-DEXTROAMPHETAMINE 20 MG PO TABS: 20.0000 mg | ORAL_TABLET | Freq: Every day | ORAL | 0 refills | Status: DC | PRN

## 2017-05-30 MED ORDER — AMPHETAMINE-DEXTROAMPHET ER 30 MG PO CP24: 30.0000 mg | ORAL_CAPSULE | Freq: Every day | ORAL | 0 refills | Status: DC

## 2017-05-30 MED ORDER — TRAMADOL HCL 50 MG PO TABS: ORAL_TABLET | ORAL | 0 refills | Status: DC

## 2017-05-30 NOTE — Telephone Encounter (Signed)
I have sent one month refill of these medications, He has to come in for an office visit prior to anymore refills after this. His last visit was October 19 and we discussed that he will need to come in every 6 months for refills of these medications at least.

## 2017-05-30 NOTE — Telephone Encounter (Signed)
Sent msg to patient via mychart on previous request../lmb

## 2017-05-30 NOTE — Telephone Encounter (Signed)
Refills already sent. Duplicate request../lmb

## 2017-06-26 ENCOUNTER — Other Ambulatory Visit: Payer: Self-pay | Admitting: Nurse Practitioner

## 2017-06-26 DIAGNOSIS — G8929 Other chronic pain: Secondary | ICD-10-CM

## 2017-06-26 DIAGNOSIS — M25561 Pain in right knee: Principal | ICD-10-CM

## 2017-06-26 MED ORDER — AMPHETAMINE-DEXTROAMPHETAMINE 20 MG PO TABS: 20.0000 mg | ORAL_TABLET | Freq: Every day | ORAL | 0 refills | Status: DC | PRN

## 2017-06-26 MED ORDER — TRAMADOL HCL 50 MG PO TABS: ORAL_TABLET | ORAL | 0 refills | Status: DC

## 2017-06-26 MED ORDER — AMPHETAMINE-DEXTROAMPHET ER 30 MG PO CP24
30.0000 mg | ORAL_CAPSULE | Freq: Every day | ORAL | 0 refills | Status: DC
Start: 2017-06-26 — End: 2017-07-24

## 2017-06-26 NOTE — Telephone Encounter (Signed)
Last refill of adderall 20 was 05/30/17  Last refill of adderall 30 was 05/31/17 Last refill of tramadol was 05/30/17

## 2017-06-26 NOTE — Telephone Encounter (Signed)
I have sent 1 month supply  This will be his last refill without an OV He is overdue for a 6 month follow up, which I have already asked him to schedule in a prior encounter.

## 2017-06-28 NOTE — Telephone Encounter (Signed)
Sent message through mychart

## 2017-07-24 ENCOUNTER — Other Ambulatory Visit: Payer: Self-pay | Admitting: Nurse Practitioner

## 2017-07-24 ENCOUNTER — Encounter: Payer: Self-pay | Admitting: Nurse Practitioner

## 2017-07-24 DIAGNOSIS — M25561 Pain in right knee: Principal | ICD-10-CM

## 2017-07-24 DIAGNOSIS — G8929 Other chronic pain: Secondary | ICD-10-CM

## 2017-07-24 DIAGNOSIS — E291 Testicular hypofunction: Secondary | ICD-10-CM

## 2017-07-24 NOTE — Telephone Encounter (Signed)
Last refill was 06/27/17 per database on all medications listed.

## 2017-07-24 NOTE — Telephone Encounter (Signed)
Last testosterone refill was 04/11/17 for a 90 day supply.

## 2017-07-24 NOTE — Telephone Encounter (Signed)
Refill request on   Adderall; last refill 06/26/17; #30, no refills  Tramadol; last refill 06/26/17; # 120, no refills  Last office visit 11/11/16  PCP Alphonse GuildAshleigh Shambley

## 2017-07-24 NOTE — Telephone Encounter (Signed)
Copied from CRM [id number]. Topic: Quick Communication - Rx Refill/Question >> Jul 24, 2017 11:31 AM Louie BunPalacios Medina, Rosey Batheresa D wrote: Medication: amphetamine-dextroamphetamine (ADDERALL XR) 30 MG 24 hr capsule,amphetamine-dextroamphetamine (ADDERALL) 20 MG tablet,traMADol (ULTRAM) 50 MG tablet  Has the patient contacted their pharmacy?Yes, pt has appt for 08/04/17, if enough can be refill to last him until his next appt. (Agent: If no, request that the patient contact the pharmacy for the refill.) (Agent: If yes, when and what did the pharmacy advise?)  Preferred Pharmacy (with phone number or street name): [id number] [place] [place] - [address]  Agent: Please be advised that RX refills may take up to 3 business days. We ask that you follow-up with your pharmacy.

## 2017-07-25 ENCOUNTER — Other Ambulatory Visit: Payer: Self-pay | Admitting: Nurse Practitioner

## 2017-07-25 DIAGNOSIS — G8929 Other chronic pain: Secondary | ICD-10-CM

## 2017-07-25 DIAGNOSIS — M25561 Pain in right knee: Principal | ICD-10-CM

## 2017-07-25 MED ORDER — AMPHETAMINE-DEXTROAMPHET ER 30 MG PO CP24: 30.0000 mg | ORAL_CAPSULE | Freq: Every day | ORAL | 0 refills | Status: DC

## 2017-07-25 MED ORDER — TRAMADOL HCL 50 MG PO TABS: ORAL_TABLET | ORAL | 0 refills | Status: DC

## 2017-07-25 MED ORDER — AMPHETAMINE-DEXTROAMPHETAMINE 20 MG PO TABS: 20.0000 mg | ORAL_TABLET | Freq: Every day | ORAL | 0 refills | Status: DC | PRN

## 2017-07-25 NOTE — Telephone Encounter (Signed)
Last refill was 06/27/17 per database

## 2017-07-25 NOTE — Telephone Encounter (Signed)
Check [place] registry last filled both on 06/27/2017.Marland Kitchen.Raechel Chute/lmb

## 2017-07-26 NOTE — Telephone Encounter (Signed)
I have sent the triamcinolone refill He needs an updated CBC and testosterone level prior to testosterone refill, please stop by lab  Before 10 am for blood draw.

## 2017-08-04 ENCOUNTER — Ambulatory Visit: Payer: BLUE CROSS/BLUE SHIELD | Admitting: Nurse Practitioner

## 2017-08-04 ENCOUNTER — Other Ambulatory Visit (INDEPENDENT_AMBULATORY_CARE_PROVIDER_SITE_OTHER): Payer: BLUE CROSS/BLUE SHIELD

## 2017-08-04 ENCOUNTER — Telehealth: Payer: Self-pay | Admitting: Nurse Practitioner

## 2017-08-04 ENCOUNTER — Encounter: Payer: Self-pay | Admitting: Nurse Practitioner

## 2017-08-04 ENCOUNTER — Other Ambulatory Visit: Payer: BLUE CROSS/BLUE SHIELD

## 2017-08-04 VITALS — BP 136/96 | HR 101 | Temp 98.7°F | Resp 16 | Ht 71.0 in | Wt 219.0 lb

## 2017-08-04 DIAGNOSIS — F988 Other specified behavioral and emotional disorders with onset usually occurring in childhood and adolescence: Secondary | ICD-10-CM

## 2017-08-04 DIAGNOSIS — G8929 Other chronic pain: Secondary | ICD-10-CM | POA: Diagnosis not present

## 2017-08-04 DIAGNOSIS — E291 Testicular hypofunction: Secondary | ICD-10-CM

## 2017-08-04 DIAGNOSIS — J4520 Mild intermittent asthma, uncomplicated: Secondary | ICD-10-CM | POA: Diagnosis not present

## 2017-08-04 DIAGNOSIS — R079 Chest pain, unspecified: Secondary | ICD-10-CM

## 2017-08-04 DIAGNOSIS — M25561 Pain in right knee: Secondary | ICD-10-CM

## 2017-08-04 DIAGNOSIS — R030 Elevated blood-pressure reading, without diagnosis of hypertension: Secondary | ICD-10-CM

## 2017-08-04 DIAGNOSIS — Z79899 Other long term (current) drug therapy: Secondary | ICD-10-CM

## 2017-08-04 LAB — LIPID PANEL
Cholesterol: 201 mg/dL — ABNORMAL HIGH (ref 0–200)
HDL: 51.6 mg/dL (ref >39.00)
NonHDL: 149.61
Total CHOL/HDL Ratio: 4
Triglycerides: 292 mg/dL — ABNORMAL HIGH (ref 0.0–149.0)
VLDL: 58.4 mg/dL — ABNORMAL HIGH (ref 0.0–40.0)

## 2017-08-04 LAB — COMPREHENSIVE METABOLIC PANEL
ALT: 22 U/L (ref 0–53)
AST: 18 U/L (ref 0–37)
Albumin: 4.9 g/dL (ref 3.5–5.2)
Alkaline Phosphatase: 44 U/L (ref 39–117)
BUN: 16 mg/dL (ref 6–23)
CO2: 33 mEq/L — ABNORMAL HIGH (ref 19–32)
Calcium: 10 mg/dL (ref 8.4–10.5)
Chloride: 98 mEq/L (ref 96–112)
Creatinine, Ser: 1.17 mg/dL (ref 0.40–1.50)
GFR: 71.24 mL/min (ref >60.00)
Glucose, Bld: 108 mg/dL — ABNORMAL HIGH (ref 70–99)
Potassium: 4.4 mEq/L (ref 3.5–5.1)
Sodium: 140 mEq/L (ref 135–145)
Total Bilirubin: 0.7 mg/dL (ref 0.2–1.2)
Total Protein: 7.1 g/dL (ref 6.0–8.3)

## 2017-08-04 LAB — CBC
HCT: 47.1 % (ref 39.0–52.0)
Hemoglobin: 16.4 g/dL (ref 13.0–17.0)
MCHC: 34.9 g/dL (ref 30.0–36.0)
MCV: 91.5 fl (ref 78.0–100.0)
Platelets: 227 10*3/uL (ref 150.0–400.0)
RBC: 5.15 Mil/uL (ref 4.22–5.81)
RDW: 13.2 % (ref 11.5–15.5)
WBC: 5.9 10*3/uL (ref 4.0–10.5)

## 2017-08-04 LAB — TESTOSTERONE: Testosterone: 259.61 ng/dL — ABNORMAL LOW (ref 300.00–890.00)

## 2017-08-04 LAB — TSH: TSH: 1.07 u[IU]/mL (ref 0.35–4.50)

## 2017-08-04 LAB — LDL CHOLESTEROL, DIRECT: Direct LDL: 114 mg/dL

## 2017-08-04 LAB — PSA: PSA: 0.77 ng/mL (ref 0.10–4.00)

## 2017-08-04 NOTE — Assessment & Plan Note (Signed)
We discussed the risks of adverse effects associated with long term use of testosterone including cardiovascular risks and he does not want to resume testosterone at this time We will Update labs today and  F/U with further recommendations pending lab results - Testosterone; Future - Lipid panel; Future-unsure If fasting - CBC; Future - TSH; Future - PSA; Future

## 2017-08-04 NOTE — Assessment & Plan Note (Signed)
Stable Continue albuterol prn F/u for new or worsening symptoms or increased use of albuterol
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
## 2017-08-04 NOTE — Patient Instructions (Addendum)
Please head downstairs for lab work. If any of your test results are critically abnormal, you will be contacted right away. Otherwise, I will contact you within a week about your test results and any recommendations for abnormalities.  I have placed a referral to cardiology for chest pain and abnormal ekg. Our office will begin processing this referral. Please follow up if you have not heard anything about this referral within 10 days.  Please plan to follow up with me in 2 weeks, or sooner if needed.   Angina Pectoris Angina pectoris is a very bad feeling in the chest, neck, or arm. Your doctor may call it angina. There are four types of angina. Angina is caused by a lack of blood in the middle and thickest layer of the heart wall (myocardium). Angina may feel like a crushing or squeezing pain in the chest. It may feel like tightness or heavy pressure in the chest. Some people say it feels like gas, heartburn, or indigestion. Some people have symptoms other than pain. These include:  Shortness of breath.  Cold sweats.  Feeling sick to your stomach (nausea).  Feeling light-headed.  Many women have chest discomfort and some of the other symptoms. However, women often have different symptoms, such as:  Feeling tired (fatigue).  Feeling nervous for no reason.  Feeling weak for no reason.  Dizziness or fainting.  Women may have angina without any symptoms. Follow these instructions at home:  Take medicines only as told by your doctor.  Take care of other health issues as told by your doctor. These include: ? High blood pressure (hypertension). ? Diabetes.  Follow a heart-healthy diet. Your doctor can help you to choose healthy food options and make changes.  Talk to your doctor to learn more about healthy cooking methods and use them. These include: ? Roasting. ? Grilling. ? Broiling. ? Baking. ? Poaching. ? Steaming. ? Stir-frying.  Follow an exercise program approved  by your doctor.  Keep a healthy weight. Lose weight as told by your doctor.  Rest when you are tired.  Learn to manage stress.  Do not use any tobacco, such as cigarettes, chewing tobacco, or electronic cigarettes. If you need help quitting, ask your doctor.  If you drink alcohol, and your doctor says it is okay, limit yourself to no more than 1 drink per day. One drink equals 12 ounces of beer, 5 ounces of wine, or 1 ounces of hard liquor.  Stop illegal drug use.  Keep all follow-up visits as told by your doctor. This is important. Do not take these medicines unless your doctor says that you can:  Nonsteroidal anti-inflammatory drugs (NSAIDs). These include: ? Ibuprofen. ? Naproxen. ? Celecoxib.  Vitamin supplements that have vitamin A, vitamin E, or both.  Hormone therapy that contains estrogen with or without progestin.  Get help right away if:  You have pain in your chest, neck, arm, jaw, stomach, or back that: ? Lasts more than a few minutes. ? Comes back. ? Does not get better after you take medicine under your tongue (sublingual nitroglycerin).  You have any of these symptoms for no reason: ? Gas, heartburn, or indigestion. ? Sweating a lot. ? Shortness of breath or trouble breathing. ? Feeling sick to your stomach or throwing up. ? Feeling more tired than usual. ? Feeling nervous or worrying more than usual. ? Feeling weak. ? Diarrhea.  You are suddenly dizzy or light-headed.  You faint or pass out. These symptoms may  be an emergency. Do not wait to see if the symptoms will go away. Get medical help right away. Call your local emergency services (911 in the [place] Do not drive yourself to the hospital. This information is not intended to replace advice given to you by your health care provider. Make sure you discuss any questions you have with your health care provider. Document Released: 06/29/2007 Document Revised: 06/18/2015 Document Reviewed:  05/14/2013 Elsevier Interactive Patient Education  2017 [employer]

## 2017-08-04 NOTE — Assessment & Plan Note (Signed)
Tramadol inconsistency noted on past UDS although controlled substance database has been consistent, will repeat UDS today and determine further plan Updated controlled substance contract with patient today - Pain Mgmt, Profile 8 w/Conf, U; Future

## 2017-08-04 NOTE — Telephone Encounter (Signed)
I noticed how elevated his blood pressure was after his visit today We had discussed a 6 month follow up, however due to his elevate BP Please ask him to return in 2 weeks for a follow up office visit so I can re-evaluate his BP

## 2017-08-04 NOTE — Assessment & Plan Note (Signed)
We discussed the risks of adverse effects associated with adderall including cardiovascular risks but he would like to continue the adderall at this time Will continue adderall at current dosage only, no future increases Updated controlled substance contract and UDS today RTC in 6 months for routine F/U - Pain Mgmt, Profile 8 w/Conf, U; Future

## 2017-08-07 ENCOUNTER — Other Ambulatory Visit: Payer: Self-pay | Admitting: Nurse Practitioner

## 2017-08-07 DIAGNOSIS — E291 Testicular hypofunction: Secondary | ICD-10-CM

## 2017-08-07 NOTE — Telephone Encounter (Signed)
Sent mychart message advising patient

## 2017-08-09 LAB — PAIN MGMT, PROFILE 8 W/CONF, U
6 Acetylmorphine: NEGATIVE ng/mL (ref <10)
Alcohol Metabolites: NEGATIVE ng/mL (ref <500)
Amphetamine: 15671 ng/mL — ABNORMAL HIGH (ref <250)
Amphetamines: POSITIVE ng/mL — AB (ref <500)
Benzodiazepines: NEGATIVE ng/mL (ref <100)
Buprenorphine, Urine: NEGATIVE ng/mL (ref <5)
Cocaine Metabolite: NEGATIVE ng/mL (ref <150)
Creatinine: 134.9 mg/dL
MDMA: NEGATIVE ng/mL (ref <500)
Marijuana Metabolite: 1062 ng/mL — ABNORMAL HIGH (ref <5)
Marijuana Metabolite: POSITIVE ng/mL — AB (ref <20)
Methamphetamine: NEGATIVE ng/mL (ref <250)
Opiates: NEGATIVE ng/mL (ref <100)
Oxidant: NEGATIVE ug/mL (ref <200)
Oxycodone: NEGATIVE ng/mL (ref <100)
pH: 6.91 (ref 4.5–9.0)

## 2017-08-23 ENCOUNTER — Other Ambulatory Visit: Payer: Self-pay | Admitting: Nurse Practitioner

## 2017-08-23 DIAGNOSIS — M25561 Pain in right knee: Principal | ICD-10-CM

## 2017-08-23 DIAGNOSIS — G8929 Other chronic pain: Secondary | ICD-10-CM

## 2017-08-24 ENCOUNTER — Ambulatory Visit: Payer: BLUE CROSS/BLUE SHIELD | Admitting: Nurse Practitioner

## 2017-08-28 ENCOUNTER — Telehealth: Payer: Self-pay | Admitting: Nurse Practitioner

## 2017-08-28 NOTE — Telephone Encounter (Signed)
Patient dismissed from [facility] by Catarina HartshornAshley Shambley NP, effective August 24, 2017. Dismissal letter sent out by certified / registered mail.  daj

## 2017-08-31 ENCOUNTER — Other Ambulatory Visit: Payer: Self-pay

## 2017-08-31 ENCOUNTER — Encounter: Payer: Self-pay | Admitting: Emergency Medicine

## 2017-08-31 ENCOUNTER — Ambulatory Visit (INDEPENDENT_AMBULATORY_CARE_PROVIDER_SITE_OTHER): Payer: BLUE CROSS/BLUE SHIELD | Admitting: Emergency Medicine

## 2017-08-31 VITALS — BP 130/88 | HR 90 | Temp 98.6°F | Resp 16 | Ht 70.25 in | Wt 207.6 lb

## 2017-08-31 DIAGNOSIS — G894 Chronic pain syndrome: Secondary | ICD-10-CM | POA: Diagnosis not present

## 2017-08-31 DIAGNOSIS — Z7689 Persons encountering health services in other specified circumstances: Secondary | ICD-10-CM

## 2017-08-31 DIAGNOSIS — F988 Other specified behavioral and emotional disorders with onset usually occurring in childhood and adolescence: Secondary | ICD-10-CM

## 2017-08-31 NOTE — Patient Instructions (Addendum)
   IF you received an x-ray today, you will receive an invoice from [facility]. Please contact [facility] at [phone] with questions or concerns regarding your invoice.   IF you received labwork today, you will receive an invoice from [facility]. Please contact [facility] at [phone] with questions or concerns regarding your invoice.   Our billing staff will not be able to assist you with questions regarding bills from these companies.  You will be contacted with the lab results as soon as they are available. The fastest way to get your results is to activate your My Chart account. Instructions are located on the last page of this paperwork. If you have not heard from us regarding the results in 2 weeks, please contact this office.       Chronic Pain, Adult Chronic pain is a type of pain that lasts or keeps coming back (recurs) for at least six months. You may have chronic headaches, abdominal pain, or body pain. Chronic pain may be related to an illness, such as fibromyalgia or complex regional pain syndrome. Sometimes the cause of chronic pain is not known. Chronic pain can make it hard for you to do daily activities. If not treated, chronic pain can lead to other health problems, including anxiety and depression. Treatment depends on the cause and severity of your pain. You may need to work with a pain specialist to come up with a treatment plan. The plan may include medicine, counseling, and physical therapy. Many people benefit from a combination of two or more types of treatment to control their pain. Follow these instructions at home: Lifestyle  Consider keeping a pain diary to share with your health care providers.  Consider talking with a mental health care provider (psychologist) about how to cope with chronic pain.  Consider joining a chronic pain support group.  Try to control or lower your stress levels. Talk to your health care provider about  strategies to do this. General instructions   Take over-the-counter and prescription medicines only as told by your health care provider.  Follow your treatment plan as told by your health care provider. This may include: ? Gentle, regular exercise. ? Eating a healthy diet that includes foods such as vegetables, fruits, fish, and lean meats. ? Cognitive or behavioral therapy. ? Working with a physical therapist. ? Meditation or yoga. ? Acupuncture or massage therapy. ? Aroma, color, light, or sound therapy. ? Local electrical stimulation. ? Shots (injections) of numbing or pain-relieving medicines into the spine or the area of pain.  Check your pain level as told by your health care provider. Ask your health care provider if you should use a pain scale.  Learn as much as you can about how to manage your chronic pain. Ask your health care provider if an intensive pain rehabilitation program or a chronic pain specialist would be helpful.  Keep all follow-up visits as told by your health care provider. This is important. Contact a health care provider if:  Your pain gets worse.  You have new pain.  You have trouble sleeping.  You have trouble doing your normal activities.  Your pain is not controlled with treatment.  Your have side effects from pain medicine.  You feel weak. Get help right away if:  You lose feeling or have numbness in your body.  You lose control of bowel or bladder function.  Your pain suddenly gets much worse.  You develop shaking or chills.  You develop confusion.    You develop chest pain.  You have trouble breathing or shortness of breath.  You pass out.  You have thoughts about hurting yourself or others. This information is not intended to replace advice given to you by your health care provider. Make sure you discuss any questions you have with your health care provider. Document Released: 10/02/2001 Document Revised: 09/10/2015 Document  Reviewed: 06/30/2015 Elsevier Interactive Patient Education  2018 [employer]vier Inc.  
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
## 2017-09-11 ENCOUNTER — Ambulatory Visit: Payer: BLUE CROSS/BLUE SHIELD | Admitting: Cardiology

## 2017-09-11 ENCOUNTER — Ambulatory Visit: Payer: Self-pay | Admitting: Cardiology

## 2017-09-19 DIAGNOSIS — F419 Anxiety disorder, unspecified: Secondary | ICD-10-CM | POA: Diagnosis not present

## 2017-09-19 DIAGNOSIS — R41840 Attention and concentration deficit: Secondary | ICD-10-CM | POA: Diagnosis not present

## 2017-09-19 DIAGNOSIS — F902 Attention-deficit hyperactivity disorder, combined type: Secondary | ICD-10-CM | POA: Diagnosis not present

## 2017-09-19 DIAGNOSIS — Z79899 Other long term (current) drug therapy: Secondary | ICD-10-CM | POA: Diagnosis not present

## 2017-10-23 DIAGNOSIS — F419 Anxiety disorder, unspecified: Secondary | ICD-10-CM | POA: Diagnosis not present

## 2017-10-23 DIAGNOSIS — F902 Attention-deficit hyperactivity disorder, combined type: Secondary | ICD-10-CM | POA: Diagnosis not present

## 2017-10-23 DIAGNOSIS — Z79899 Other long term (current) drug therapy: Secondary | ICD-10-CM | POA: Diagnosis not present

## 2017-10-23 DIAGNOSIS — F338 Other recurrent depressive disorders: Secondary | ICD-10-CM | POA: Diagnosis not present

## 2018-01-03 ENCOUNTER — Other Ambulatory Visit: Payer: Self-pay | Admitting: Sports Medicine

## 2018-01-23 DIAGNOSIS — H93299 Other abnormal auditory perceptions, unspecified ear: Secondary | ICD-10-CM | POA: Diagnosis not present

## 2018-01-23 DIAGNOSIS — F419 Anxiety disorder, unspecified: Secondary | ICD-10-CM | POA: Diagnosis not present

## 2018-01-23 DIAGNOSIS — F338 Other recurrent depressive disorders: Secondary | ICD-10-CM | POA: Diagnosis not present

## 2018-01-23 DIAGNOSIS — F902 Attention-deficit hyperactivity disorder, combined type: Secondary | ICD-10-CM | POA: Diagnosis not present

## 2018-01-23 DIAGNOSIS — Z79899 Other long term (current) drug therapy: Secondary | ICD-10-CM | POA: Diagnosis not present

## 2018-02-23 ENCOUNTER — Ambulatory Visit: Payer: BLUE CROSS/BLUE SHIELD | Admitting: Emergency Medicine

## 2018-03-26 ENCOUNTER — Other Ambulatory Visit: Payer: Self-pay

## 2018-03-26 ENCOUNTER — Encounter: Payer: Self-pay | Admitting: Emergency Medicine

## 2018-03-26 ENCOUNTER — Ambulatory Visit (INDEPENDENT_AMBULATORY_CARE_PROVIDER_SITE_OTHER): Payer: BLUE CROSS/BLUE SHIELD

## 2018-03-26 ENCOUNTER — Ambulatory Visit: Payer: BLUE CROSS/BLUE SHIELD | Admitting: Emergency Medicine

## 2018-03-26 VITALS — BP 133/88 | HR 108 | Temp 98.4°F | Resp 16 | Ht 70.5 in | Wt 232.6 lb

## 2018-03-26 DIAGNOSIS — R079 Chest pain, unspecified: Secondary | ICD-10-CM | POA: Insufficient documentation

## 2018-03-26 DIAGNOSIS — M542 Cervicalgia: Secondary | ICD-10-CM | POA: Diagnosis not present

## 2018-03-26 DIAGNOSIS — R21 Rash and other nonspecific skin eruption: Secondary | ICD-10-CM | POA: Diagnosis not present

## 2018-03-26 DIAGNOSIS — R12 Heartburn: Secondary | ICD-10-CM | POA: Diagnosis not present

## 2018-03-26 DIAGNOSIS — G894 Chronic pain syndrome: Secondary | ICD-10-CM

## 2018-03-26 MED ORDER — TRIAMCINOLONE ACETONIDE 0.1 % EX CREA: 1.0000 "application " | TOPICAL_CREAM | Freq: Two times a day (BID) | CUTANEOUS | 3 refills | Status: DC

## 2018-03-26 MED ORDER — GABAPENTIN 300 MG PO CAPS: ORAL_CAPSULE | ORAL | 2 refills | Status: DC

## 2018-03-26 MED ORDER — ESOMEPRAZOLE MAGNESIUM 40 MG PO CPDR: 40.0000 mg | DELAYED_RELEASE_CAPSULE | Freq: Every day | ORAL | 3 refills | Status: DC

## 2018-03-26 NOTE — Assessment & Plan Note (Addendum)
Atypical chest pain.  No significant cardiac risk factors.  Non-smoker.  Clinically stable.  Stable vital signs.  Normal EKG and normal chest x-ray.  Doubt cardiac etiology.  Most likely related to heartburn.  If symptoms persist or clinical picture changes will consider cardiology evaluation.

## 2018-03-26 NOTE — Progress Notes (Signed)
Billy Bray 48 y.o.   Chief Complaint  Patient presents with  . Medication Refill    Triamcinolone cream  . Chest Pain    x 2 weeks with heatburn and tightness in LEFT shoulder    HISTORY OF PRESENT ILLNESS: This is a 48 y.o. male complaining of brief episodes of difficulty breathing and chest pain intermittently for the past couple of weeks.  Episodes last just a few minutes.  Chest pain sometimes feels like heartburn but at times it also radiates into the left chest and left arm.  No history of cardiac conditions.  Increased stress recently.  No other significant symptoms.  HPI   Prior to Admission medications   Medication Sig Start Date End Date Taking? Authorizing Provider  albuterol (PROVENTIL HFA;VENTOLIN HFA) 108 (90 Base) MCG/ACT inhaler Inhale 1-2 puffs into the lungs every 6 (six) hours as needed for wheezing or shortness of breath. 04/17/15  Yes Veryl Speak, FNP  albuterol (PROVENTIL) (2.5 MG/3ML) 0.083% nebulizer solution Take 3 mLs (2.5 mg total) by nebulization every 6 (six) hours as needed for wheezing or shortness of breath. 04/17/15  Yes Veryl Speak, FNP  Albuterol Sulfate (PROAIR RESPICLICK) 108 (90 Base) MCG/ACT AEPB Inhale 2 puffs into the lungs every 4 (four) hours as needed. 09/14/15  Yes Veryl Speak, FNP  amphetamine-dextroamphetamine (ADDERALL XR) 30 MG 24 hr capsule Take 1 capsule (30 mg total) by mouth daily. 07/25/17  Yes Shambley, Audie Box, NP  amphetamine-dextroamphetamine (ADDERALL) 20 MG tablet Take 1 tablet (20 mg total) by mouth daily as needed. 07/25/17  Yes Evaristo Bury, NP  gabapentin (NEURONTIN) 300 MG capsule TAKE ONE CAPSULE EVERY MORNING AND EARLY AFTERNOON. TAKE 3 CAPSULES AT BEDTIME 01/11/17  Yes Shambley, Audie Box, NP  gabapentin (NEURONTIN) 300 MG capsule 1 tab po in AM and early afternoon.  3 tabs po qhs 05/11/16  Yes [provider]  triamcinolone cream (KENALOG) 0.1 % APPLY TO AFFECTED AREA TWICE A DAY FOR 2  WEEKS 07/26/17  Yes Shambley, Audie Box, NP  cyclobenzaprine (FLEXERIL) 10 MG tablet Take 1 tablet (10 mg total) by mouth 3 (three) times daily as needed for muscle spasms. Patient not taking: Reported on 03/26/2018 08/03/16   Andrena Mews, DO  traMADol (ULTRAM) 50 MG tablet TAKE 1 TABLET BY MOUTH 4 TIMES A DAY AS NEEDED FOR SEVERE PAIN Patient not taking: Reported on 03/26/2018 07/25/17   Evaristo Bury, NP    Allergies  Allergen Reactions  . Amoxicillin Hives    Patient Active Problem List   Diagnosis Date Noted  . Chronic pain syndrome 08/31/2017  . Degeneration of intervertebral disc of cervical region 09/13/2016  . TMJ dysfunction 08/08/2016  . Cervical spondylolysis 08/05/2016  . Neck pain 05/24/2016  . Polyarthralgia 05/24/2016  . Ganglion cyst of wrist, left 02/08/2016  . Asthma 09/14/2015  . Secondary male hypogonadism 06/17/2015  . Lump in armpit 02/20/2015  . ADD (attention deficit disorder) 09/02/2014  . Knee pain 09/02/2014  . Neuropathy 09/02/2014    Past Medical History:  Diagnosis Date  . Allergy   . Arthritis   . Asthma   . Depression   . Kidney stones     Past Surgical History:  Procedure Laterality Date  . BILATERAL CARPAL TUNNEL RELEASE  2004    Social History   Socioeconomic History  . Marital status: Divorced    Spouse name: Not on file  . Number of children: 2  . Years of  education: 14  . Highest education level: Not on file  Occupational History  . Occupation: Engineer, site  . Financial resource strain: Not on file  . Food insecurity:    Worry: Not on file    Inability: Not on file  . Transportation needs:    Medical: Not on file    Non-medical: Not on file  Tobacco Use  . Smoking status: Former Games developer  . Smokeless tobacco: Never Used  Substance and Sexual Activity  . Alcohol use: No    Alcohol/week: 0.0 standard drinks  . Drug use: Yes    Types: Marijuana  . Sexual activity: Not Currently  Lifestyle  .  Physical activity:    Days per week: Not on file    Minutes per session: Not on file  . Stress: Not on file  Relationships  . Social connections:    Talks on phone: Not on file    Gets together: Not on file    Attends religious service: Not on file    Active member of club or organization: Not on file    Attends meetings of clubs or organizations: Not on file    Relationship status: Not on file  . Intimate partner violence:    Fear of current or ex partner: Not on file    Emotionally abused: Not on file    Physically abused: Not on file    Forced sexual activity: Not on file  Other Topics Concern  . Not on file  Social History Narrative   Fun: Entertainment sounds/lights for concerts,   Denies religious beliefs effecting health care.     Family History  Problem Relation Age of Onset  . Healthy Mother   . Healthy Father   . Stroke Maternal Grandmother   . Diabetes Maternal Grandmother   . Stroke Maternal Grandfather   . Diabetes Maternal Grandfather   . Colon cancer Paternal Grandfather      Review of Systems  Constitutional: Negative.  Negative for fever.  HENT: Negative.   Eyes: Negative.   Respiratory: Positive for shortness of breath. Negative for cough and hemoptysis.   Cardiovascular: Positive for chest pain.  Gastrointestinal: Positive for heartburn and nausea. Negative for abdominal pain, diarrhea, melena and vomiting.  Genitourinary: Negative.   Musculoskeletal: Negative.   Skin: Negative.   Neurological: Negative.  Negative for dizziness and headaches.  Endo/Heme/Allergies: Negative.   All other systems reviewed and are negative.   Vitals:   03/26/18 1057  BP: 133/88  Pulse: (!) 108  Resp: 16  Temp: 98.4 F (36.9 C)  SpO2: 97%    Physical Exam Vitals signs reviewed.  Constitutional:      Appearance: He is well-developed.  HENT:     Head: Normocephalic and atraumatic.     Mouth/Throat:     Mouth: Mucous membranes are moist.     Pharynx:  Oropharynx is clear.  Eyes:     Extraocular Movements: Extraocular movements intact.     Conjunctiva/sclera: Conjunctivae normal.     Pupils: Pupils are equal, round, and reactive to light.  Neck:     Musculoskeletal: Normal range of motion and neck supple.  Cardiovascular:     Rate and Rhythm: Normal rate and regular rhythm.     Pulses: Normal pulses.     Heart sounds: Normal heart sounds.  Pulmonary:     Effort: Pulmonary effort is normal.     Breath sounds: Normal breath sounds.  Abdominal:     General: There  is no distension.     Palpations: Abdomen is soft.     Tenderness: There is no abdominal tenderness.  Musculoskeletal: Normal range of motion.  Skin:    General: Skin is warm and dry.     Capillary Refill: Capillary refill takes less than 2 seconds.     Findings: Rash (Frontal scalp and forehead) present.  Neurological:     General: No focal deficit present.     Mental Status: He is alert and oriented to person, place, and time.  Psychiatric:        Mood and Affect: Mood normal.        Behavior: Behavior normal.      EKG: Normal sinus rhythm with ventricular rate of 79.  No acute ischemic changes.  Normal EKG.  Dg Chest 2 View  Result Date: 03/26/2018 CLINICAL DATA:  Nonspecific chest pain. EXAM: CHEST - 2 VIEW COMPARISON:  05/29/2009 FINDINGS: Lungs are adequately inflated with minimal straightening of the left hemidiaphragm and mild blunting of the left costophrenic angle likely chronic. No acute airspace process. Cardiomediastinal silhouette and remainder the exam is unchanged. IMPRESSION: No acute findings.  Chronic left basilar changes. Electronically Signed   By: Elberta Fortis M.D.   On: 03/26/2018 11:40   A total of 40 minutes was spent in the room with the patient, greater than 50% of which was in counseling/coordination of care regarding differential diagnosis, treatment, medications, EKG review, chest x-ray review, prognosis, and need for  follow-up.   ASSESSMENT & PLAN: Nonspecific chest pain Atypical chest pain.  No significant cardiac risk factors.  Non-smoker.  Clinically stable.  Stable vital signs.  Normal EKG and normal chest x-ray.  Doubt cardiac etiology.  Most likely related to heartburn.  If symptoms persist or clinical picture changes will consider cardiology evaluation.   Kalib was seen today for medication refill and chest pain.  Diagnoses and all orders for this visit:  Nonspecific chest pain -     DG Chest 2 View; Future -     EKG 12-Lead  Rash and nonspecific skin eruption -     triamcinolone cream (KENALOG) 0.1 %; Apply 1 application topically 2 (two) times daily.  Heartburn -     esomeprazole (NEXIUM) 40 MG capsule; Take 1 capsule (40 mg total) by mouth daily.  Neck pain  Chronic pain syndrome -     gabapentin (NEURONTIN) 300 MG capsule; TAKE ONE CAPSULE EVERY MORNING AND EARLY AFTERNOON. TAKE 3 CAPSULES AT BEDTIME    Patient Instructions       If you have lab work done today you will be contacted with your lab results within the next 2 weeks.  If you have not heard from [place] then please contact us. The fastest way to get your results is to register for My Chart.   IF you received an x-ray today, you will receive an invoice from [facility]. Please contact [facility] at [phone] with questions or concerns regarding your invoice.   IF you received labwork today, you will receive an invoice from [facility]. Please contact [facility] at [phone] with questions or concerns regarding your invoice.   Our billing staff will not be able to assist you with questions regarding bills from these companies.  You will be contacted with the lab results as soon as they are available. The fastest way to get your results is to activate your My Chart account. Instructions are located on the last page of this paperwork. If you have not  heard from [place] regarding the results in 2 weeks, please  contact this office.     Nonspecific Chest Pain Chest pain can be caused by many different conditions. Some causes of chest pain can be life-threatening. These will require treatment right away. Serious causes of chest pain include:  Heart attack.  A tear in the body's main blood vessel.  Redness and swelling (inflammation) around your heart.  Blood clot in your lungs. Other causes of chest pain may not be so serious. These include:  Heartburn.  Anxiety or stress.  Damage to bones or muscles in your chest.  Lung infections. Chest pain can feel like:  Pain or discomfort in your chest.  Crushing, pressure, aching, or squeezing pain.  Burning or tingling.  Dull or sharp pain that is worse when you move, cough, or take a deep breath.  Pain or discomfort that is also felt in your back, neck, jaw, shoulder, or arm, or pain that spreads to any of these areas. It is hard to know whether your pain is caused by something that is serious or something that is not so serious. So it is important to see your doctor right away if you have chest pain. Follow these instructions at home: Medicines  Take over-the-counter and prescription medicines only as told by your doctor.  If you were prescribed an antibiotic medicine, take it as told by your doctor. Do not stop taking the antibiotic even if you start to feel better. Lifestyle   Rest as told by your doctor.  Do not use any products that contain nicotine or tobacco, such as cigarettes, e-cigarettes, and chewing tobacco. If you need help quitting, ask your doctor.  Do not drink alcohol.  Make lifestyle changes as told by your doctor. These may include: ? Getting regular exercise. Ask your doctor what activities are safe for you. ? Eating a heart-healthy diet. A diet and nutrition specialist (dietitian) can help you to learn healthy eating options. ? Staying at a healthy weight. ? Treating diabetes or high blood pressure, if  needed. ? Lowering your stress. Activities such as yoga and relaxation techniques can help. General instructions  Pay attention to any changes in your symptoms. Tell your doctor about them or any new symptoms.  Avoid any activities that cause chest pain.  Keep all follow-up visits as told by your doctor. This is important. You may need more testing if your chest pain does not go away. Contact a doctor if:  Your chest pain does not go away.  You feel depressed.  You have a fever. Get help right away if:  Your chest pain is worse.  You have a cough that gets worse, or you cough up blood.  You have very bad (severe) pain in your belly (abdomen).  You pass out (faint).  You have either of these for no clear reason: ? Sudden chest discomfort. ? Sudden discomfort in your arms, back, neck, or jaw.  You have shortness of breath at any time.  You suddenly start to sweat, or your skin gets clammy.  You feel sick to your stomach (nauseous).  You throw up (vomit).  You suddenly feel lightheaded or dizzy.  You feel very weak or tired.  Your heart starts to beat fast, or it feels like it is skipping beats. These symptoms may be an emergency. Do not wait to see if the symptoms will go away. Get medical help right away. Call your local emergency services (911 in the [place] Do  not drive yourself to the hospital. Summary  Chest pain can be caused by many different conditions. The cause may be serious and need treatment right away. If you have chest pain, see your doctor right away.  Follow your doctor's instructions for taking medicines and making lifestyle changes.  Keep all follow-up visits as told by your doctor. This includes visits for any further testing if your chest pain does not go away.  Be sure to know the signs that show that your condition has become worse. Get help right away if you have these symptoms. This information is not intended to replace advice given to you  by your health care provider. Make sure you discuss any questions you have with your health care provider. Document Released: 06/29/2007 Document Revised: 07/13/2017 Document Reviewed: 07/13/2017 Elsevier Interactive Patient Education  2019 Elsevier Inc.       Edwina Barth, MD [facility]

## 2018-03-26 NOTE — Patient Instructions (Addendum)
   If you have lab work done today you will be contacted with your lab results within the next 2 weeks.  If you have not heard from us then please contact us. The fastest way to get your results is to register for My Chart.   IF you received an x-ray today, you will receive an invoice from [facility]. Please contact [facility] at [phone] with questions or concerns regarding your invoice.   IF you received labwork today, you will receive an invoice from [facility]. Please contact [facility] at [phone] with questions or concerns regarding your invoice.   Our billing staff will not be able to assist you with questions regarding bills from these companies.  You will be contacted with the lab results as soon as they are available. The fastest way to get your results is to activate your My Chart account. Instructions are located on the last page of this paperwork. If you have not heard from us regarding the results in 2 weeks, please contact this office.     Nonspecific Chest Pain Chest pain can be caused by many different conditions. Some causes of chest pain can be life-threatening. These will require treatment right away. Serious causes of chest pain include:  Heart attack.  A tear in the body's main blood vessel.  Redness and swelling (inflammation) around your heart.  Blood clot in your lungs. Other causes of chest pain may not be so serious. These include:  Heartburn.  Anxiety or stress.  Damage to bones or muscles in your chest.  Lung infections. Chest pain can feel like:  Pain or discomfort in your chest.  Crushing, pressure, aching, or squeezing pain.  Burning or tingling.  Dull or sharp pain that is worse when you move, cough, or take a deep breath.  Pain or discomfort that is also felt in your back, neck, jaw, shoulder, or arm, or pain that spreads to any of these areas. It is hard to know whether your pain is caused by something that is  serious or something that is not so serious. So it is important to see your doctor right away if you have chest pain. Follow these instructions at home: Medicines  Take over-the-counter and prescription medicines only as told by your doctor.  If you were prescribed an antibiotic medicine, take it as told by your doctor. Do not stop taking the antibiotic even if you start to feel better. Lifestyle   Rest as told by your doctor.  Do not use any products that contain nicotine or tobacco, such as cigarettes, e-cigarettes, and chewing tobacco. If you need help quitting, ask your doctor.  Do not drink alcohol.  Make lifestyle changes as told by your doctor. These may include: ? Getting regular exercise. Ask your doctor what activities are safe for you. ? Eating a heart-healthy diet. A diet and nutrition specialist (dietitian) can help you to learn healthy eating options. ? Staying at a healthy weight. ? Treating diabetes or high blood pressure, if needed. ? Lowering your stress. Activities such as yoga and relaxation techniques can help. General instructions  Pay attention to any changes in your symptoms. Tell your doctor about them or any new symptoms.  Avoid any activities that cause chest pain.  Keep all follow-up visits as told by your doctor. This is important. You may need more testing if your chest pain does not go away. Contact a doctor if:  Your chest pain does not go away.  You feel   depressed.  You have a fever. Get help right away if:  Your chest pain is worse.  You have a cough that gets worse, or you cough up blood.  You have very bad (severe) pain in your belly (abdomen).  You pass out (faint).  You have either of these for no clear reason: ? Sudden chest discomfort. ? Sudden discomfort in your arms, back, neck, or jaw.  You have shortness of breath at any time.  You suddenly start to sweat, or your skin gets clammy.  You feel sick to your stomach  (nauseous).  You throw up (vomit).  You suddenly feel lightheaded or dizzy.  You feel very weak or tired.  Your heart starts to beat fast, or it feels like it is skipping beats. These symptoms may be an emergency. Do not wait to see if the symptoms will go away. Get medical help right away. Call your local emergency services (911 in the [place] Do not drive yourself to the hospital. Summary  Chest pain can be caused by many different conditions. The cause may be serious and need treatment right away. If you have chest pain, see your doctor right away.  Follow your doctor's instructions for taking medicines and making lifestyle changes.  Keep all follow-up visits as told by your doctor. This includes visits for any further testing if your chest pain does not go away.  Be sure to know the signs that show that your condition has become worse. Get help right away if you have these symptoms. This information is not intended to replace advice given to you by your health care provider. Make sure you discuss any questions you have with your health care provider. Document Released: 06/29/2007 Document Revised: 07/13/2017 Document Reviewed: 07/13/2017 Elsevier Interactive Patient Education  2019 Elsevier Inc.  

## 2018-05-03 DIAGNOSIS — F419 Anxiety disorder, unspecified: Secondary | ICD-10-CM | POA: Diagnosis not present

## 2018-05-03 DIAGNOSIS — F338 Other recurrent depressive disorders: Secondary | ICD-10-CM | POA: Diagnosis not present

## 2018-05-03 DIAGNOSIS — F902 Attention-deficit hyperactivity disorder, combined type: Secondary | ICD-10-CM | POA: Diagnosis not present

## 2018-05-03 DIAGNOSIS — Z79899 Other long term (current) drug therapy: Secondary | ICD-10-CM | POA: Diagnosis not present

## 2018-05-24 ENCOUNTER — Other Ambulatory Visit: Payer: Self-pay

## 2018-05-24 ENCOUNTER — Ambulatory Visit: Payer: BLUE CROSS/BLUE SHIELD | Admitting: Emergency Medicine

## 2018-05-24 ENCOUNTER — Encounter: Payer: Self-pay | Admitting: Emergency Medicine

## 2018-05-24 VITALS — BP 149/103 | HR 104 | Temp 98.5°F | Resp 16 | Ht 70.25 in | Wt 237.2 lb

## 2018-05-24 DIAGNOSIS — M940 Chondrocostal junction syndrome [Tietze]: Secondary | ICD-10-CM | POA: Diagnosis not present

## 2018-05-24 DIAGNOSIS — R0789 Other chest pain: Secondary | ICD-10-CM | POA: Diagnosis not present

## 2018-05-24 MED ORDER — IBUPROFEN-FAMOTIDINE 800-26.6 MG PO TABS: 1.0000 | ORAL_TABLET | Freq: Two times a day (BID) | ORAL | 1 refills | Status: AC

## 2018-05-24 NOTE — Patient Instructions (Addendum)
     If you have lab work done today you will be contacted with your lab results within the next 2 weeks.  If you have not heard from us then please contact us. The fastest way to get your results is to register for My Chart.   IF you received an x-ray today, you will receive an invoice from  Radiology. Please contact  Radiology at [phone] with questions or concerns regarding your invoice.   IF you received labwork today, you will receive an invoice from [facility]. Please contact [facility] at [phone] with questions or concerns regarding your invoice.   Our billing staff will not be able to assist you with questions regarding bills from these companies.  You will be contacted with the lab results as soon as they are available. The fastest way to get your results is to activate your My Chart account. Instructions are located on the last page of this paperwork. If you have not heard from us regarding the results in 2 weeks, please contact this office.     Costochondritis Costochondritis is swelling and irritation (inflammation) of the tissue (cartilage) that connects your ribs to your breastbone (sternum). This causes pain in the front of your chest. Usually, the pain:  Starts gradually.  Is in more than one rib. This condition usually goes away on its own over time. Follow these instructions at home:  Do not do anything that makes your pain worse.  If directed, put ice on the painful area: ? Put ice in a plastic bag. ? Place a towel between your skin and the bag. ? Leave the ice on for 20 minutes, 2-3 times a day.  If directed, put heat on the affected area as often as told by your doctor. Use the heat source that your doctor tells you to use, such as a moist heat pack or a heating pad. ? Place a towel between your skin and the heat source. ? Leave the heat on for 20-30 minutes. ? Take off the heat if your skin turns bright red. This is very important if  you cannot feel pain, heat, or cold. You may have a greater risk of getting burned.  Take over-the-counter and prescription medicines only as told by your doctor.  Return to your normal activities as told by your doctor. Ask your doctor what activities are safe for you.  Keep all follow-up visits as told by your doctor. This is important. Contact a doctor if:  You have chills or a fever.  Your pain does not go away or it gets worse.  You have a cough that does not go away. Get help right away if:  You are short of breath. This information is not intended to replace advice given to you by your health care provider. Make sure you discuss any questions you have with your health care provider. Document Released: 06/29/2007 Document Revised: 07/31/2015 Document Reviewed: 05/06/2015 Elsevier Interactive Patient Education  2019 Elsevier Inc.  

## 2018-05-24 NOTE — Progress Notes (Signed)
Billy Bray 48 y.o.   Chief Complaint  Patient presents with  . Chest Pain    per patient it started about a month ago on upper right side of chest after the fall    HISTORY OF PRESENT ILLNESS: This is a 48 y.o. male complaining of right-sided chest pain almost daily for the past 4 weeks.  Sharp pain without radiation, worse with deep breathing, coughing, or sneezing and not associated with any other symptoms.  No other complaints or medical concerns today. Larey Seat backwards 2 months ago but no direct hit to his chest. HPI   Prior to Admission medications   Medication Sig Start Date End Date Taking? Authorizing Provider  albuterol (PROVENTIL) (2.5 MG/3ML) 0.083% nebulizer solution Take 3 mLs (2.5 mg total) by nebulization every 6 (six) hours as needed for wheezing or shortness of breath. 04/17/15  Yes Veryl Speak, FNP  Albuterol Sulfate (PROAIR RESPICLICK) 108 (90 Base) MCG/ACT AEPB Inhale 2 puffs into the lungs every 4 (four) hours as needed. 09/14/15  Yes Veryl Speak, FNP  amphetamine-dextroamphetamine (ADDERALL XR) 30 MG 24 hr capsule Take 1 capsule (30 mg total) by mouth daily. 07/25/17  Yes Shambley, Audie Box, NP  amphetamine-dextroamphetamine (ADDERALL) 20 MG tablet Take 1 tablet (20 mg total) by mouth daily as needed. 07/25/17  Yes Evaristo Bury, NP  esomeprazole (NEXIUM) 40 MG capsule Take 1 capsule (40 mg total) by mouth daily. 03/26/18  Yes Manual Navarra, Eilleen Kempf, MD  gabapentin (NEURONTIN) 300 MG capsule 1 tab po in AM and early afternoon.  3 tabs po qhs 05/11/16  Yes [provider]  gabapentin (NEURONTIN) 300 MG capsule TAKE ONE CAPSULE EVERY MORNING AND EARLY AFTERNOON. TAKE 3 CAPSULES AT BEDTIME 03/26/18  Yes Francois Elk, Eilleen Kempf, MD  triamcinolone cream (KENALOG) 0.1 % Apply 1 application topically 2 (two) times daily. 03/26/18  Yes Wardell Pokorski, Eilleen Kempf, MD  albuterol (PROVENTIL HFA;VENTOLIN HFA) 108 (90 Base) MCG/ACT inhaler Inhale 1-2 puffs into the lungs  every 6 (six) hours as needed for wheezing or shortness of breath. Patient not taking: Reported on 05/24/2018 04/17/15   Veryl Speak, FNP  cyclobenzaprine (FLEXERIL) 10 MG tablet Take 1 tablet (10 mg total) by mouth 3 (three) times daily as needed for muscle spasms. Patient not taking: Reported on 05/24/2018 08/03/16   Andrena Mews, DO  traMADol (ULTRAM) 50 MG tablet TAKE 1 TABLET BY MOUTH 4 TIMES A DAY AS NEEDED FOR SEVERE PAIN Patient not taking: Reported on 05/24/2018 07/25/17   Evaristo Bury, NP    Allergies  Allergen Reactions  . Amoxicillin Hives    Patient Active Problem List   Diagnosis Date Noted  . Chronic pain syndrome 08/31/2017  . Degeneration of intervertebral disc of cervical region 09/13/2016  . TMJ dysfunction 08/08/2016  . Cervical spondylolysis 08/05/2016  . Polyarthralgia 05/24/2016  . Ganglion cyst of wrist, left 02/08/2016  . Asthma 09/14/2015  . Secondary male hypogonadism 06/17/2015  . ADD (attention deficit disorder) 09/02/2014  . Neuropathy 09/02/2014    Past Medical History:  Diagnosis Date  . Allergy   . Arthritis   . Asthma   . Depression   . Kidney stones     Past Surgical History:  Procedure Laterality Date  . BILATERAL CARPAL TUNNEL RELEASE  2004    Social History   Socioeconomic History  . Marital status: Divorced    Spouse name: Not on file  . Number of children: 2  . Years of education:  14  . Highest education level: Not on file  Occupational History  . Occupation: Engineer, site  . Financial resource strain: Not on file  . Food insecurity:    Worry: Not on file    Inability: Not on file  . Transportation needs:    Medical: Not on file    Non-medical: Not on file  Tobacco Use  . Smoking status: Former Games developer  . Smokeless tobacco: Never Used  Substance and Sexual Activity  . Alcohol use: No    Alcohol/week: 0.0 standard drinks  . Drug use: Yes    Types: Marijuana  . Sexual activity: Not Currently   Lifestyle  . Physical activity:    Days per week: Not on file    Minutes per session: Not on file  . Stress: Not on file  Relationships  . Social connections:    Talks on phone: Not on file    Gets together: Not on file    Attends religious service: Not on file    Active member of club or organization: Not on file    Attends meetings of clubs or organizations: Not on file    Relationship status: Not on file  . Intimate partner violence:    Fear of current or ex partner: Not on file    Emotionally abused: Not on file    Physically abused: Not on file    Forced sexual activity: Not on file  Other Topics Concern  . Not on file  Social History Narrative   Fun: Entertainment sounds/lights for concerts,   Denies religious beliefs effecting health care.     Family History  Problem Relation Age of Onset  . Healthy Mother   . Healthy Father   . Stroke Maternal Grandmother   . Diabetes Maternal Grandmother   . Stroke Maternal Grandfather   . Diabetes Maternal Grandfather   . Colon cancer Paternal Grandfather      Review of Systems  Constitutional: Negative.  Negative for chills and fever.  HENT: Negative.  Negative for sore throat.   Respiratory: Negative.  Negative for cough, hemoptysis, sputum production, shortness of breath and wheezing.   Cardiovascular: Positive for chest pain (Localized and right-sided chest wall). Negative for palpitations.  Gastrointestinal: Negative.  Negative for abdominal pain, diarrhea, nausea and vomiting.  Genitourinary: Negative for dysuria and hematuria.  Skin: Negative.   All other systems reviewed and are negative.   Vitals:   05/24/18 1540  BP: (!) 149/103  Pulse: (!) 104  Resp: 16  Temp: 98.5 F (36.9 C)  SpO2: 97%    Physical Exam Vitals signs reviewed.  Constitutional:      Appearance: Normal appearance. He is well-developed.  HENT:     Head: Normocephalic and atraumatic.     Mouth/Throat:     Mouth: Mucous membranes are  moist.     Pharynx: Oropharynx is clear.  Eyes:     Extraocular Movements: Extraocular movements intact.     Pupils: Pupils are equal, round, and reactive to light.  Neck:     Musculoskeletal: Normal range of motion and neck supple.  Cardiovascular:     Rate and Rhythm: Normal rate and regular rhythm.     Heart sounds: Normal heart sounds.  Pulmonary:     Effort: Pulmonary effort is normal.     Breath sounds: Normal breath sounds.  Chest:     Chest wall: Tenderness (Right anterior lower chest wall) present.  Abdominal:     Palpations: Abdomen  is soft.     Tenderness: There is no abdominal tenderness.  Musculoskeletal: Normal range of motion.  Skin:    General: Skin is warm.     Capillary Refill: Capillary refill takes less than 2 seconds.  Neurological:     General: No focal deficit present.     Mental Status: He is alert and oriented to person, place, and time.  Psychiatric:        Mood and Affect: Mood normal.        Behavior: Behavior normal.    A total of 25 minutes was spent in the room with the patient, greater than 50% of which was in counseling/coordination of care regarding differential diagnosis, treatment, medication side effects, physical activity, prognosis, and need for follow-up if no better or worse.    ASSESSMENT & PLAN: Jemari was seen today for chest pain.  Diagnoses and all orders for this visit:  Acute costochondritis -     Ibuprofen-Famotidine (DUEXIS) 800-26.6 MG TABS; Take 1 tablet by mouth 2 (two) times a day for 7 days. After 7 days take as needed.  Right-sided chest wall pain     Patient Instructions       If you have lab work done today you will be contacted with your lab results within the next 2 weeks.  If you have not heard from [place] then please contact us. The fastest way to get your results is to register for My Chart.   IF you received an x-ray today, you will receive an invoice from [facility]. Please contact [facility]  Radiology at [phone] with questions or concerns regarding your invoice.   IF you received labwork today, you will receive an invoice from [facility]. Please contact [facility] at [phone] with questions or concerns regarding your invoice.   Our billing staff will not be able to assist you with questions regarding bills from these companies.  You will be contacted with the lab results as soon as they are available. The fastest way to get your results is to activate your My Chart account. Instructions are located on the last page of this paperwork. If you have not heard from [place] regarding the results in 2 weeks, please contact this office.     Costochondritis Costochondritis is swelling and irritation (inflammation) of the tissue (cartilage) that connects your ribs to your breastbone (sternum). This causes pain in the front of your chest. Usually, the pain:  Starts gradually.  Is in more than one rib. This condition usually goes away on its own over time. Follow these instructions at home:  Do not do anything that makes your pain worse.  If directed, put ice on the painful area: ? Put ice in a plastic bag. ? Place a towel between your skin and the bag. ? Leave the ice on for 20 minutes, 2-3 times a day.  If directed, put heat on the affected area as often as told by your doctor. Use the heat source that your doctor tells you to use, such as a moist heat pack or a heating pad. ? Place a towel between your skin and the heat source. ? Leave the heat on for 20-30 minutes. ? Take off the heat if your skin turns bright red. This is very important if you cannot feel pain, heat, or cold. You may have a greater risk of getting burned.  Take over-the-counter and prescription medicines only as told by your doctor.  Return to your normal activities as told by your  doctor. Ask your doctor what activities are safe for you.  Keep all follow-up visits as told by your doctor. This is important.  Contact a doctor if:  You have chills or a fever.  Your pain does not go away or it gets worse.  You have a cough that does not go away. Get help right away if:  You are short of breath. This information is not intended to replace advice given to you by your health care provider. Make sure you discuss any questions you have with your health care provider. Document Released: 06/29/2007 Document Revised: 07/31/2015 Document Reviewed: 05/06/2015 Elsevier Interactive Patient Education  2019 Elsevier Inc.      Edwina BarthMiguel Maxey Ransom, MD [facility]

## 2018-06-22 ENCOUNTER — Encounter: Payer: Self-pay | Admitting: Emergency Medicine

## 2018-06-23 ENCOUNTER — Other Ambulatory Visit: Payer: Self-pay

## 2018-06-23 ENCOUNTER — Emergency Department (HOSPITAL_COMMUNITY)
Admission: EM | Admit: 2018-06-23 | Discharge: 2018-06-24 | Disposition: A | Discharge status: Home / Self Care | Payer: BLUE CROSS/BLUE SHIELD | Attending: Emergency Medicine | Admitting: Emergency Medicine

## 2018-06-23 ENCOUNTER — Encounter (HOSPITAL_COMMUNITY): Payer: Self-pay | Admitting: Emergency Medicine

## 2018-06-23 ENCOUNTER — Emergency Department (HOSPITAL_COMMUNITY): Payer: BLUE CROSS/BLUE SHIELD

## 2018-06-23 DIAGNOSIS — R200 Anesthesia of skin: Secondary | ICD-10-CM | POA: Insufficient documentation

## 2018-06-23 DIAGNOSIS — Z87891 Personal history of nicotine dependence: Secondary | ICD-10-CM | POA: Insufficient documentation

## 2018-06-23 DIAGNOSIS — R42 Dizziness and giddiness: Secondary | ICD-10-CM | POA: Diagnosis not present

## 2018-06-23 DIAGNOSIS — F1210 Cannabis abuse, uncomplicated: Secondary | ICD-10-CM | POA: Insufficient documentation

## 2018-06-23 DIAGNOSIS — R0902 Hypoxemia: Secondary | ICD-10-CM | POA: Diagnosis not present

## 2018-06-23 DIAGNOSIS — Z79899 Other long term (current) drug therapy: Secondary | ICD-10-CM | POA: Insufficient documentation

## 2018-06-23 DIAGNOSIS — J45909 Unspecified asthma, uncomplicated: Secondary | ICD-10-CM | POA: Insufficient documentation

## 2018-06-23 DIAGNOSIS — R0789 Other chest pain: Secondary | ICD-10-CM | POA: Diagnosis not present

## 2018-06-23 DIAGNOSIS — R55 Syncope and collapse: Secondary | ICD-10-CM | POA: Insufficient documentation

## 2018-06-23 DIAGNOSIS — R5383 Other fatigue: Secondary | ICD-10-CM | POA: Insufficient documentation

## 2018-06-23 DIAGNOSIS — R202 Paresthesia of skin: Secondary | ICD-10-CM | POA: Insufficient documentation

## 2018-06-23 DIAGNOSIS — R61 Generalized hyperhidrosis: Secondary | ICD-10-CM | POA: Diagnosis not present

## 2018-06-23 MED ORDER — SODIUM CHLORIDE 0.9 % IV BOLUS
1000.0000 mL | Freq: Once | INTRAVENOUS | Status: AC
  Administered 2018-06-23: 1000 mL via INTRAVENOUS

## 2018-06-23 NOTE — ED Triage Notes (Signed)
Patient BIB [facility] from home for near syncopal episode. Pt was walking up stairs, had wave of dizziness, diaphoretic, laid down on floor, never lost consciousness. Pt felt better but still diaphoretic when EMS arrived. EMS did 12 lead that was unremarkable.

## 2018-06-23 NOTE — ED Notes (Signed)
Bed: CW23 Expected date:  Expected time:  Means of arrival:  Comments: EMS 48 yo male near syncope-walked upstairs and became diaphoretic-dizzy-laid down on floor

## 2018-06-23 NOTE — ED Provider Notes (Signed)
[facility]-EMERGENCY DEPT Provider Note   CSN: [id number] Arrival date & time: 06/23/18  2216    History   Chief Complaint Chief Complaint  Patient presents with  . Near Syncope    HPI Billy Bray is a 48 y.o. male.     HPI  48 year old male presents for near syncope.  He states he is been feeling poorly over the last couple days.  Yesterday he had a headache.  Over the last 2 days he has been having discomfort and tingling/numbness in his hands and feet bilaterally.  He has been having right-sided chest wall pain for at least 1 month and has seen his PCP once before.  The pain is not new or worse.  No other new chest pain.  No shortness of breath.  He has been feeling fatigued all day today and did not go to work.  He has not been eating and drinking very much due to no appetite.  No fevers or other illness.  He started to feel lightheaded while at rest and called 911 because he has had syncope/near syncope like this before.  No obvious cause.  He felt much worse when he walked to make sure the door was unlocked for EMS but never passed out. No palpitations.  Past Medical History:  Diagnosis Date  . Allergy   . Arthritis   . Asthma   . Depression   . Kidney stones     Patient Active Problem List   Diagnosis Date Noted  . Chronic pain syndrome 08/31/2017  . Degeneration of intervertebral disc of cervical region 09/13/2016  . TMJ dysfunction 08/08/2016  . Cervical spondylolysis 08/05/2016  . Polyarthralgia 05/24/2016  . Ganglion cyst of wrist, left 02/08/2016  . Asthma 09/14/2015  . Secondary male hypogonadism 06/17/2015  . ADD (attention deficit disorder) 09/02/2014  . Neuropathy 09/02/2014    Past Surgical History:  Procedure Laterality Date  . BILATERAL CARPAL TUNNEL RELEASE  2004        Home Medications    Prior to Admission medications   Medication Sig Start Date End Date Taking? Authorizing Provider  albuterol (PROVENTIL  HFA;VENTOLIN HFA) 108 (90 Base) MCG/ACT inhaler Inhale 1-2 puffs into the lungs every 6 (six) hours as needed for wheezing or shortness of breath. Patient not taking: Reported on 05/24/2018 04/17/15   Veryl Speakalone, Gregory D, FNP  albuterol (PROVENTIL) (2.5 MG/3ML) 0.083% nebulizer solution Take 3 mLs (2.5 mg total) by nebulization every 6 (six) hours as needed for wheezing or shortness of breath. 04/17/15   Veryl Speakalone, Gregory D, FNP  Albuterol Sulfate (PROAIR RESPICLICK) 108 (90 Base) MCG/ACT AEPB Inhale 2 puffs into the lungs every 4 (four) hours as needed. 09/14/15   Veryl Speakalone, Gregory D, FNP  amphetamine-dextroamphetamine (ADDERALL XR) 30 MG 24 hr capsule Take 1 capsule (30 mg total) by mouth daily. 07/25/17   Evaristo BuryShambley, Ashleigh N, NP  amphetamine-dextroamphetamine (ADDERALL) 20 MG tablet Take 1 tablet (20 mg total) by mouth daily as needed. 07/25/17   Evaristo BuryShambley, Ashleigh N, NP  cyclobenzaprine (FLEXERIL) 10 MG tablet Take 1 tablet (10 mg total) by mouth 3 (three) times daily as needed for muscle spasms. Patient not taking: Reported on 05/24/2018 08/03/16   Andrena Mewsigby, Michael D, DO  esomeprazole (NEXIUM) 40 MG capsule Take 1 capsule (40 mg total) by mouth daily. 03/26/18   Georgina QuintSagardia, Miguel Jose, MD  gabapentin (NEURONTIN) 300 MG capsule 1 tab po in AM and early afternoon.  3 tabs po qhs 05/11/16  [provider]  gabapentin (NEURONTIN) 300 MG capsule TAKE ONE CAPSULE EVERY MORNING AND EARLY AFTERNOON. TAKE 3 CAPSULES AT BEDTIME 03/26/18   Sagardia, Eilleen Kempf, MD  traMADol (ULTRAM) 50 MG tablet TAKE 1 TABLET BY MOUTH 4 TIMES A DAY AS NEEDED FOR SEVERE PAIN Patient not taking: Reported on 05/24/2018 07/25/17   Evaristo Bury, NP  triamcinolone cream (KENALOG) 0.1 % Apply 1 application topically 2 (two) times daily. 03/26/18   Georgina Quint, MD    Family History Family History  Problem Relation Age of Onset  . Healthy Mother   . Healthy Father   . Stroke Maternal Grandmother   . Diabetes Maternal  Grandmother   . Stroke Maternal Grandfather   . Diabetes Maternal Grandfather   . Colon cancer Paternal Grandfather     Social History Social History   Tobacco Use  . Smoking status: Former Games developer  . Smokeless tobacco: Never Used  Substance Use Topics  . Alcohol use: No    Alcohol/week: 0.0 standard drinks  . Drug use: Yes    Types: Marijuana     Allergies   Amoxicillin   Review of Systems Review of Systems  Constitutional: Positive for diaphoresis and fatigue. Negative for fever.  Respiratory: Negative for shortness of breath.   Cardiovascular: Positive for chest pain. Negative for palpitations.  Gastrointestinal: Negative for vomiting.  Neurological: Positive for light-headedness, numbness and headaches. Negative for syncope.  All other systems reviewed and are negative.    Physical Exam Updated Vital Signs BP (!) 146/105 (BP Location: Left Arm)   Pulse 77   Temp 98.2 F (36.8 C) (Oral)   Resp 16   Ht  (1.803 m)   Wt 102.1 kg   SpO2 96%   BMI 31.38 kg/m   Physical Exam Vitals signs and nursing note reviewed.  Constitutional:      General: He is not in acute distress.    Appearance: He is well-developed. He is not ill-appearing.  HENT:     Head: Normocephalic and atraumatic.     Right Ear: External ear normal.     Left Ear: External ear normal.     Nose: Nose normal.  Eyes:     General:        Right eye: No discharge.        Left eye: No discharge.     Extraocular Movements: Extraocular movements intact.     Pupils: Pupils are equal, round, and reactive to light.  Neck:     Musculoskeletal: Neck supple.  Cardiovascular:     Rate and Rhythm: Normal rate and regular rhythm.     Heart sounds: Normal heart sounds. No murmur.  Pulmonary:     Effort: Pulmonary effort is normal.     Breath sounds: Normal breath sounds.  Abdominal:     Palpations: Abdomen is soft.     Tenderness: There is no abdominal tenderness.  Skin:    General: Skin is  warm and dry.  Neurological:     Mental Status: He is alert.     Comments: CN 3-12 grossly intact. 5/5 strength in all 4 extremities. Grossly normal sensation. Normal finger to nose.  Psychiatric:        Mood and Affect: Mood is not anxious.      ED Treatments / Results  Labs (all labs ordered are listed, but only abnormal results are displayed) Labs Reviewed  BASIC METABOLIC PANEL  CBC WITH DIFFERENTIAL/PLATELET  MAGNESIUM    EKG  EKG Interpretation  Date/Time:  Saturday Jun 23 2018 22:28:42 EDT Ventricular Rate:  80 PR Interval:    QRS Duration: 95 QT Interval:  377 QTC Calculation: 435 R Axis:   -8 Text Interpretation:  Normal sinus rhythm no acute ST/T changes tachycardia resolved compared to 2011 Confirmed by Pricilla Loveless [phone]) on 06/23/2018 10:40:11 PM   Radiology Dg Chest 2 View  Result Date: 06/23/2018 CLINICAL DATA:  Right chest wall pain EXAM: CHEST - 2 VIEW COMPARISON:  03/26/2018 FINDINGS: Heart and mediastinal contours are within normal limits. No focal opacities or effusions. No acute bony abnormality. IMPRESSION: No active cardiopulmonary disease. Electronically Signed   By: Charlett Nose M.D.   On: 06/23/2018 22:48    Procedures Procedures (including critical care time)  Medications Ordered in ED Medications  sodium chloride 0.9 % bolus 1,000 mL (1,000 mLs Intravenous New Bag/Given 06/23/18 2313)     Initial Impression / Assessment and Plan / ED Course  I have reviewed the triage vital signs and the nursing notes.  Pertinent labs & imaging results that were available during my care of the patient were reviewed by me and considered in my medical decision making (see chart for details).        Patient is overall well-appearing without any acute neuro deficits.  No arrhythmia on ECG.  No ischemic symptoms.  I doubt PE, especially with no chest pain or shortness of breath, increased work of breathing or hypoxia.  He is not tachycardic.  He will be  given IV fluids and labs will be evaluated, especially for the near syncope but also the nonspecific tingling/numbness in all 4 extremities.  His neuro exam is benign.  He did have a headache yesterday though often gets headaches and given the benign neuro exam I do not think emergent head CT or MRI is needed.  This near syncope has happened to him before. Care to Dr. Bebe Shaggy, labs currently pending.  Final Clinical Impressions(s) / ED Diagnoses   Final diagnoses:  None    ED Discharge Orders    None       Pricilla Loveless, MD 06/23/18 2336

## 2018-06-24 LAB — CBC WITH DIFFERENTIAL/PLATELET
Abs Immature Granulocytes: 0.02 10*3/uL (ref 0.00–0.07)
Basophils Absolute: 0 10*3/uL (ref 0.0–0.1)
Basophils Relative: 0 %
Eosinophils Absolute: 0.1 10*3/uL (ref 0.0–0.5)
Eosinophils Relative: 1 %
HCT: 46 % (ref 39.0–52.0)
Hemoglobin: 15.9 g/dL (ref 13.0–17.0)
Immature Granulocytes: 0 %
Lymphocytes Relative: 19 %
Lymphs Abs: 1.4 10*3/uL (ref 0.7–4.0)
MCH: 30.9 pg (ref 26.0–34.0)
MCHC: 34.6 g/dL (ref 30.0–36.0)
MCV: 89.5 fL (ref 80.0–100.0)
Monocytes Absolute: 0.6 10*3/uL (ref 0.1–1.0)
Monocytes Relative: 7 %
Neutro Abs: 5.5 10*3/uL (ref 1.7–7.7)
Neutrophils Relative %: 73 %
Platelets: 273 10*3/uL (ref 150–400)
RBC: 5.14 MIL/uL (ref 4.22–5.81)
RDW: 12.5 % (ref 11.5–15.5)
WBC: 7.6 10*3/uL (ref 4.0–10.5)
nRBC: 0 % (ref 0.0–0.2)

## 2018-06-24 LAB — BASIC METABOLIC PANEL
Anion gap: 10 (ref 5–15)
BUN: 18 mg/dL (ref 6–20)
CO2: 24 mmol/L (ref 22–32)
Calcium: 9.1 mg/dL (ref 8.9–10.3)
Chloride: 106 mmol/L (ref 98–111)
Creatinine, Ser: 0.99 mg/dL (ref 0.61–1.24)
GFR calc Af Amer: >60 mL/min (ref >60)
GFR calc non Af Amer: >60 mL/min (ref >60)
Glucose, Bld: 111 mg/dL — ABNORMAL HIGH (ref 70–99)
Potassium: 3.5 mmol/L (ref 3.5–5.1)
Sodium: 140 mmol/L (ref 135–145)

## 2018-06-24 LAB — MAGNESIUM: Magnesium: 2.3 mg/dL (ref 1.7–2.4)

## 2018-06-24 NOTE — ED Provider Notes (Signed)
Labs reassuring.  Patient walked around in no distress.  He feels comfortable for discharge.  He reports history of this in the past.  He is in no acute distress   Zadie Rhine, MD 06/24/18 [phone]

## 2018-06-24 NOTE — ED Notes (Signed)
Patient ambulated to restroom without difficulty, dizziness or lightheadedness.

## 2018-06-27 ENCOUNTER — Encounter: Payer: Self-pay | Admitting: Emergency Medicine

## 2018-06-27 ENCOUNTER — Other Ambulatory Visit: Payer: Self-pay

## 2018-06-27 ENCOUNTER — Ambulatory Visit (INDEPENDENT_AMBULATORY_CARE_PROVIDER_SITE_OTHER): Payer: BC Managed Care – PPO | Admitting: Emergency Medicine

## 2018-06-27 VITALS — BP 140/100 | HR 103 | Temp 98.3°F | Resp 16 | Ht 71.0 in | Wt 225.0 lb

## 2018-06-27 DIAGNOSIS — Z130 Encounter for screening for diseases of the blood and blood-forming organs and certain disorders involving the immune mechanism: Secondary | ICD-10-CM | POA: Diagnosis not present

## 2018-06-27 DIAGNOSIS — Z1329 Encounter for screening for other suspected endocrine disorder: Secondary | ICD-10-CM

## 2018-06-27 DIAGNOSIS — I10 Essential (primary) hypertension: Secondary | ICD-10-CM | POA: Diagnosis not present

## 2018-06-27 DIAGNOSIS — R202 Paresthesia of skin: Secondary | ICD-10-CM | POA: Insufficient documentation

## 2018-06-27 DIAGNOSIS — E291 Testicular hypofunction: Secondary | ICD-10-CM

## 2018-06-27 DIAGNOSIS — Z13228 Encounter for screening for other metabolic disorders: Secondary | ICD-10-CM

## 2018-06-27 DIAGNOSIS — Z13220 Encounter for screening for lipoid disorders: Secondary | ICD-10-CM

## 2018-06-27 DIAGNOSIS — Z0001 Encounter for general adult medical examination with abnormal findings: Secondary | ICD-10-CM | POA: Diagnosis not present

## 2018-06-27 MED ORDER — AMLODIPINE BESYLATE 5 MG PO TABS: 5.0000 mg | ORAL_TABLET | Freq: Every day | ORAL | 3 refills | Status: DC

## 2018-06-27 NOTE — Patient Instructions (Addendum)

## 2018-06-27 NOTE — Progress Notes (Signed)
Billy Bray 48 y.o.   Chief Complaint  Patient presents with  . Annual Exam    HISTORY OF PRESENT ILLNESS: This is a 48 y.o. male here for his annual exam. Billy Bray has several chronic medical problems including the following: 1.  Asthma, well controlled. 2.  Chronic pain syndrome. 3.  Polyarthralgia with peripheral polyneuropathies. 4.  ADD. 5.  Secondary male hypogonadism Was recently in the emergency room for near syncopal episode.  Work-up was negative.  Negative EKG and negative blood work. Complaining of persistently elevated blood pressure readings at home and in the emergency room. No changes in his lifestyle.  No new medications.  No new diet or nutritional changes.  Denies any additional stress in his life.  Cannot identify any particular triggers. No other complaints or medical concerns.  HPI   Prior to Admission medications   Medication Sig Start Date End Date Taking? Authorizing Provider  albuterol (PROVENTIL HFA;VENTOLIN HFA) 108 (90 Base) MCG/ACT inhaler Inhale 1-2 puffs into the lungs every 6 (six) hours as needed for wheezing or shortness of breath. 04/17/15  Yes Veryl Speak, FNP  albuterol (PROVENTIL) (2.5 MG/3ML) 0.083% nebulizer solution Take 3 mLs (2.5 mg total) by nebulization every 6 (six) hours as needed for wheezing or shortness of breath. 04/17/15  Yes Veryl Speak, FNP  Albuterol Sulfate (PROAIR RESPICLICK) 108 (90 Base) MCG/ACT AEPB Inhale 2 puffs into the lungs every 4 (four) hours as needed. 09/14/15  Yes Veryl Speak, FNP  amphetamine-dextroamphetamine (ADDERALL XR) 30 MG 24 hr capsule Take 1 capsule (30 mg total) by mouth daily. 07/25/17  Yes Shambley, Audie Box, NP  amphetamine-dextroamphetamine (ADDERALL) 20 MG tablet Take 1 tablet (20 mg total) by mouth daily as needed. 07/25/17  Yes Evaristo Bury, NP  esomeprazole (NEXIUM) 40 MG capsule Take 1 capsule (40 mg total) by mouth daily. 03/26/18  Yes Amol Domanski, Eilleen Kempf, MD  gabapentin  (NEURONTIN) 300 MG capsule 1 tab po in AM and early afternoon.  3 tabs po qhs 05/11/16  Yes [provider]  gabapentin (NEURONTIN) 300 MG capsule TAKE ONE CAPSULE EVERY MORNING AND EARLY AFTERNOON. TAKE 3 CAPSULES AT BEDTIME 03/26/18  Yes Lauriana Denes, Eilleen Kempf, MD  triamcinolone cream (KENALOG) 0.1 % Apply 1 application topically 2 (two) times daily. 03/26/18  Yes Aubrielle Stroud, Eilleen Kempf, MD  cyclobenzaprine (FLEXERIL) 10 MG tablet Take 1 tablet (10 mg total) by mouth 3 (three) times daily as needed for muscle spasms. Patient not taking: Reported on 06/27/2018 08/03/16   Andrena Mews, DO  traMADol (ULTRAM) 50 MG tablet TAKE 1 TABLET BY MOUTH 4 TIMES A DAY AS NEEDED FOR SEVERE PAIN Patient not taking: Reported on 06/27/2018 07/25/17   Evaristo Bury, NP    Allergies  Allergen Reactions  . Amoxicillin Hives    Patient Active Problem List   Diagnosis Date Noted  . Chronic pain syndrome 08/31/2017  . Degeneration of intervertebral disc of cervical region 09/13/2016  . TMJ dysfunction 08/08/2016  . Cervical spondylolysis 08/05/2016  . Polyarthralgia 05/24/2016  . Ganglion cyst of wrist, left 02/08/2016  . Asthma 09/14/2015  . Secondary male hypogonadism 06/17/2015  . ADD (attention deficit disorder) 09/02/2014  . Neuropathy 09/02/2014    Past Medical History:  Diagnosis Date  . Allergy   . Arthritis   . Asthma   . Depression   . Kidney stones     Past Surgical History:  Procedure Laterality Date  . BILATERAL CARPAL TUNNEL RELEASE  2004  Social History   Socioeconomic History  . Marital status: Divorced    Spouse name: Not on file  . Number of children: 2  . Years of education: 63  . Highest education level: Not on file  Occupational History  . Occupation: Engineer, site  . Financial resource strain: Not on file  . Food insecurity:    Worry: Not on file    Inability: Not on file  . Transportation needs:    Medical: Not on file    Non-medical:  Not on file  Tobacco Use  . Smoking status: Former Games developer  . Smokeless tobacco: Never Used  Substance and Sexual Activity  . Alcohol use: No    Alcohol/week: 0.0 standard drinks  . Drug use: Yes    Types: Marijuana  . Sexual activity: Not Currently  Lifestyle  . Physical activity:    Days per week: Not on file    Minutes per session: Not on file  . Stress: Not on file  Relationships  . Social connections:    Talks on phone: Not on file    Gets together: Not on file    Attends religious service: Not on file    Active member of club or organization: Not on file    Attends meetings of clubs or organizations: Not on file    Relationship status: Not on file  . Intimate partner violence:    Fear of current or ex partner: Not on file    Emotionally abused: Not on file    Physically abused: Not on file    Forced sexual activity: Not on file  Other Topics Concern  . Not on file  Social History Narrative   Fun: Entertainment sounds/lights for concerts,   Denies religious beliefs effecting health care.     Family History  Problem Relation Age of Onset  . Healthy Mother   . Healthy Father   . Stroke Maternal Grandmother   . Diabetes Maternal Grandmother   . Stroke Maternal Grandfather   . Diabetes Maternal Grandfather   . Colon cancer Paternal Grandfather      Review of Systems  Constitutional: Negative.  Negative for chills, fever and malaise/fatigue.  HENT: Negative.  Negative for congestion, hearing loss, sinus pain, sore throat and tinnitus.   Eyes: Negative.  Negative for blurred vision and double vision.  Respiratory: Negative.  Negative for cough and shortness of breath.   Cardiovascular: Negative.  Negative for chest pain, palpitations and leg swelling.  Gastrointestinal: Negative.  Negative for abdominal pain, blood in stool, diarrhea, nausea and vomiting.  Genitourinary: Negative.  Negative for dysuria and hematuria.  Musculoskeletal: Positive for back pain and  joint pain.  Skin: Negative.  Negative for rash.  Neurological: Positive for tingling (Hands and feet), tremors (Intermittent) and sensory change (Hands and feet). Negative for dizziness, focal weakness, seizures, loss of consciousness and headaches.  Endo/Heme/Allergies: Negative.   All other systems reviewed and are negative.   Vitals:   06/27/18 1332  BP: (!) 140/100  Pulse: (!) 103  Resp: 16  Temp: 98.3 F (36.8 C)  SpO2: 97%    Physical Exam Vitals signs reviewed.  Constitutional:      Appearance: Normal appearance.  HENT:     Head: Normocephalic and atraumatic.     Nose: Nose normal.     Mouth/Throat:     Mouth: Mucous membranes are moist.     Pharynx: Oropharynx is clear.  Eyes:     Extraocular Movements: Extraocular  movements intact.     Conjunctiva/sclera: Conjunctivae normal.     Pupils: Pupils are equal, round, and reactive to light.  Neck:     Musculoskeletal: Normal range of motion and neck supple. No muscular tenderness.  Cardiovascular:     Rate and Rhythm: Normal rate and regular rhythm.     Pulses: Normal pulses.     Heart sounds: Normal heart sounds.  Pulmonary:     Effort: Pulmonary effort is normal.     Breath sounds: Normal breath sounds.  Abdominal:     General: There is no distension.     Palpations: Abdomen is soft. There is no mass.     Tenderness: There is no abdominal tenderness. There is no guarding.  Musculoskeletal: Normal range of motion.  Lymphadenopathy:     Cervical: No cervical adenopathy.  Skin:    General: Skin is warm and dry.     Capillary Refill: Capillary refill takes less than 2 seconds.  Neurological:     General: No focal deficit present.     Mental Status: He is alert and oriented to person, place, and time.     Sensory: No sensory deficit.     Motor: No weakness.     Coordination: Coordination normal.     Gait: Gait normal.  Psychiatric:        Mood and Affect: Mood normal.        Behavior: Behavior normal.       ASSESSMENT & PLAN: Billy Bray was seen today for annual exam.  Diagnoses and all orders for this visit:  Encounter for general adult medical examination with abnormal findings -     Comprehensive metabolic panel  Essential hypertension -     Comprehensive metabolic panel -     amLODipine (NORVASC) 5 MG tablet; Take 1 tablet (5 mg total) by mouth daily.  Paresthesias -     Hemoglobin A1c -     TSH -     Ambulatory referral to Neurology -     Vitamin B12 -     VITAMIN D 25 Hydroxy (Vit-D Deficiency, Fractures)  Screening for deficiency anemia -     CBC with Differential  Screening for lipoid disorders -     Lipid panel  Screening for endocrine, metabolic and immunity disorder -     Comprehensive metabolic panel -     Hemoglobin A1c -     Lipid panel -     TSH  Secondary male hypogonadism -     TestT+TestF+SHBG    Patient Instructions       If you have lab work done today you will be contacted with your lab results within the next 2 weeks.  If you have not heard from us then please contact us. The fastest way to get your results is to register for My Chart.   IF you received an x-ray today, you will receive an invoice from [facility]. Please contact [facility] at [phone] with questions or concerns regarding your invoice.   IF you received labwork today, you will receive an invoice from [facility]. Please contact [facility] at [phone] with questions or concerns regarding your invoice.   Our billing staff will not be able to assist you with questions regarding bills from these companies.  You will be contacted with the lab results as soon as they are available. The fastest way to get your results is to activate your My Chart account. Instructions are located on the last page of this  paperwork. If you have not heard from [place] regarding the results in 2 weeks, please contact this office.      Health Maintenance, Male A healthy lifestyle and  preventive care is important for your health and wellness. Ask your health care provider about what schedule of regular examinations is right for you. What should I know about weight and diet? Eat a Healthy Diet  Eat plenty of vegetables, fruits, whole grains, low-fat dairy products, and lean protein.  Do not eat a lot of foods high in solid fats, added sugars, or salt.  Maintain a Healthy Weight Regular exercise can help you achieve or maintain a healthy weight. You should:  Do at least 150 minutes of exercise each week. The exercise should increase your heart rate and make you sweat (moderate-intensity exercise).  Do strength-training exercises at least twice a week. Watch Your Levels of Cholesterol and Blood Lipids  Have your blood tested for lipids and cholesterol every 5 years starting at 47 years of age. If you are at high risk for heart disease, you should start having your blood tested when you are 47 years old. You may need to have your cholesterol levels checked more often if: ? Your lipid or cholesterol levels are high. ? You are older than 47 years of age. ? You are at high risk for heart disease. What should I know about cancer screening? Many types of cancers can be detected early and may often be prevented. Lung Cancer  You should be screened every year for lung cancer if: ? You are a current smoker who has smoked for at least 30 years. ? You are a former smoker who has quit within the past 15 years.  Talk to your health care provider about your screening options, when you should start screening, and how often you should be screened. Colorectal Cancer  Routine colorectal cancer screening usually begins at 47 years of age and should be repeated every 5-10 years until you are 47 years old. You may need to be screened more often if early forms of precancerous polyps or small growths are found. Your health care provider may recommend screening at an earlier age if you have  risk factors for colon cancer.  Your health care provider may recommend using home test kits to check for hidden blood in the stool.  A small camera at the end of a tube can be used to examine your colon (sigmoidoscopy or colonoscopy). This checks for the earliest forms of colorectal cancer. Prostate and Testicular Cancer  Depending on your age and overall health, your health care provider may do certain tests to screen for prostate and testicular cancer.  Talk to your health care provider about any symptoms or concerns you have about testicular or prostate cancer. Skin Cancer  Check your skin from head to toe regularly.  Tell your health care provider about any new moles or changes in moles, especially if: ? There is a change in a mole's size, shape, or color. ? You have a mole that is larger than a pencil eraser.  Always use sunscreen. Apply sunscreen liberally and repeat throughout the day.  Protect yourself by wearing long sleeves, pants, a wide-brimmed hat, and sunglasses when outside. What should I know about heart disease, diabetes, and high blood pressure?  If you are 59-33 years of age, have your blood pressure checked every 3-5 years. If you are 79 years of age or older, have your blood pressure checked every year. You  should have your blood pressure measured twice-once when you are at a hospital or clinic, and once when you are not at a hospital or clinic. Record the average of the two measurements. To check your blood pressure when you are not at a hospital or clinic, you can use: ? An automated blood pressure machine at a pharmacy. ? A home blood pressure monitor.  Talk to your health care provider about your target blood pressure.  If you are between 84-93 years old, ask your health care provider if you should take aspirin to prevent heart disease.  Have regular diabetes screenings by checking your fasting blood sugar level. ? If you are at a normal weight and have a low  risk for diabetes, have this test once every three years after the age of 12. ? If you are overweight and have a high risk for diabetes, consider being tested at a younger age or more often.  A one-time screening for abdominal aortic aneurysm (AAA) by ultrasound is recommended for men aged 65-75 years who are current or former smokers. What should I know about preventing infection? Hepatitis B If you have a higher risk for hepatitis B, you should be screened for this virus. Talk with your health care provider to find out if you are at risk for hepatitis B infection. Hepatitis C Blood testing is recommended for:  Everyone born from [birth year] through 1964.  Anyone with known risk factors for hepatitis C. Sexually Transmitted Diseases (STDs)  You should be screened each year for STDs including gonorrhea and chlamydia if: ? You are sexually active and are younger than 47 years of age. ? You are older than 47 years of age and your health care provider tells you that you are at risk for this type of infection. ? Your sexual activity has changed since you were last screened and you are at an increased risk for chlamydia or gonorrhea. Ask your health care provider if you are at risk.  Talk with your health care provider about whether you are at high risk of being infected with HIV. Your health care provider may recommend a prescription medicine to help prevent HIV infection. What else can I do?  Schedule regular health, dental, and eye exams.  Stay current with your vaccines (immunizations).  Do not use any tobacco products, such as cigarettes, chewing tobacco, and e-cigarettes. If you need help quitting, ask your health care provider.  Limit alcohol intake to no more than 2 drinks per day. One drink equals 12 ounces of beer, 5 ounces of wine, or 1 ounces of hard liquor.  Do not use street drugs.  Do not share needles.  Ask your health care provider for help if you need support or information  about quitting drugs.  Tell your health care provider if you often feel depressed.  Tell your health care provider if you have ever been abused or do not feel safe at home. This information is not intended to replace advice given to you by your health care provider. Make sure you discuss any questions you have with your health care provider. Document Released: 07/09/2007 Document Revised: 09/09/2015 Document Reviewed: 10/14/2014 Elsevier Interactive Patient Education  2019 Elsevier Inc.      Edwina Barth, MD [facility]

## 2018-06-28 ENCOUNTER — Encounter: Payer: Self-pay | Admitting: Emergency Medicine

## 2018-07-03 LAB — CBC WITH DIFFERENTIAL/PLATELET
Basophils Absolute: 0 10*3/uL (ref 0.0–0.2)
Basos: 1 %
EOS (ABSOLUTE): 0.1 10*3/uL (ref 0.0–0.4)
Eos: 2 %
Hematocrit: 45 % (ref 37.5–51.0)
Hemoglobin: 15.4 g/dL (ref 13.0–17.7)
Immature Grans (Abs): 0 10*3/uL (ref 0.0–0.1)
Immature Granulocytes: 0 %
Lymphocytes Absolute: 1.4 10*3/uL (ref 0.7–3.1)
Lymphs: 25 %
MCH: 30.9 pg (ref 26.6–33.0)
MCHC: 34.2 g/dL (ref 31.5–35.7)
MCV: 90 fL (ref 79–97)
Monocytes Absolute: 0.5 10*3/uL (ref 0.1–0.9)
Monocytes: 9 %
Neutrophils Absolute: 3.6 10*3/uL (ref 1.4–7.0)
Neutrophils: 63 %
Platelets: 265 10*3/uL (ref 150–450)
RBC: 4.99 x10E6/uL (ref 4.14–5.80)
RDW: 13 % (ref 11.6–15.4)
WBC: 5.7 10*3/uL (ref 3.4–10.8)

## 2018-07-03 LAB — TESTT+TESTF+SHBG
Sex Hormone Binding: 22.9 nmol/L (ref 16.5–55.9)
Testosterone, Free: 8.3 pg/mL (ref 6.8–21.5)
Testosterone, Total, LC/MS: 266.1 ng/dL (ref 264.0–916.0)

## 2018-07-03 LAB — VITAMIN B12: Vitamin B-12: 1416 pg/mL — ABNORMAL HIGH (ref 232–1245)

## 2018-07-03 LAB — TSH: TSH: 1.97 u[IU]/mL (ref 0.450–4.500)

## 2018-07-03 LAB — HEMOGLOBIN A1C
Est. average glucose Bld gHb Est-mCnc: 111 mg/dL
Hgb A1c MFr Bld: 5.5 % (ref 4.8–5.6)

## 2018-07-03 LAB — COMPREHENSIVE METABOLIC PANEL
ALT: 26 IU/L (ref 0–44)
AST: 20 IU/L (ref 0–40)
Albumin/Globulin Ratio: 2.1 (ref 1.2–2.2)
Albumin: 5.1 g/dL — ABNORMAL HIGH (ref 4.0–5.0)
Alkaline Phosphatase: 56 IU/L (ref 39–117)
BUN/Creatinine Ratio: 18 (ref 9–20)
BUN: 19 mg/dL (ref 6–24)
Bilirubin Total: 0.4 mg/dL (ref 0.0–1.2)
CO2: 21 mmol/L (ref 20–29)
Calcium: 9.7 mg/dL (ref 8.7–10.2)
Chloride: 101 mmol/L (ref 96–106)
Creatinine, Ser: 1.07 mg/dL (ref 0.76–1.27)
GFR calc Af Amer: 95 mL/min/{1.73_m2} (ref >59)
GFR calc non Af Amer: 82 mL/min/{1.73_m2} (ref >59)
Globulin, Total: 2.4 g/dL (ref 1.5–4.5)
Glucose: 99 mg/dL (ref 65–99)
Potassium: 4.1 mmol/L (ref 3.5–5.2)
Sodium: 142 mmol/L (ref 134–144)
Total Protein: 7.5 g/dL (ref 6.0–8.5)

## 2018-07-03 LAB — VITAMIN D 25 HYDROXY (VIT D DEFICIENCY, FRACTURES): Vit D, 25-Hydroxy: 33.7 ng/mL (ref 30.0–100.0)

## 2018-07-03 LAB — LIPID PANEL
Chol/HDL Ratio: 3.7 ratio (ref 0.0–5.0)
Cholesterol, Total: 196 mg/dL (ref 100–199)
HDL: 53 mg/dL (ref >39)
LDL Calculated: 96 mg/dL (ref 0–99)
Triglycerides: 235 mg/dL — ABNORMAL HIGH (ref 0–149)
VLDL Cholesterol Cal: 47 mg/dL — ABNORMAL HIGH (ref 5–40)

## 2018-07-15 ENCOUNTER — Other Ambulatory Visit: Payer: Self-pay | Admitting: Emergency Medicine

## 2018-07-15 DIAGNOSIS — R12 Heartburn: Secondary | ICD-10-CM

## 2018-07-26 DIAGNOSIS — F419 Anxiety disorder, unspecified: Secondary | ICD-10-CM | POA: Diagnosis not present

## 2018-07-26 DIAGNOSIS — Z79899 Other long term (current) drug therapy: Secondary | ICD-10-CM | POA: Diagnosis not present

## 2018-07-26 DIAGNOSIS — F338 Other recurrent depressive disorders: Secondary | ICD-10-CM | POA: Diagnosis not present

## 2018-07-26 DIAGNOSIS — F902 Attention-deficit hyperactivity disorder, combined type: Secondary | ICD-10-CM | POA: Diagnosis not present

## 2018-08-14 ENCOUNTER — Ambulatory Visit: Payer: BC Managed Care – PPO | Admitting: Neurology

## 2018-08-23 ENCOUNTER — Other Ambulatory Visit: Payer: Self-pay | Admitting: Emergency Medicine

## 2018-08-23 DIAGNOSIS — R21 Rash and other nonspecific skin eruption: Secondary | ICD-10-CM

## 2018-09-20 ENCOUNTER — Other Ambulatory Visit: Payer: Self-pay | Admitting: Emergency Medicine

## 2018-09-20 DIAGNOSIS — G894 Chronic pain syndrome: Secondary | ICD-10-CM

## 2018-09-27 ENCOUNTER — Other Ambulatory Visit: Payer: Self-pay

## 2018-09-27 ENCOUNTER — Ambulatory Visit (INDEPENDENT_AMBULATORY_CARE_PROVIDER_SITE_OTHER): Payer: BC Managed Care – PPO

## 2018-09-27 ENCOUNTER — Ambulatory Visit (HOSPITAL_COMMUNITY)
Admission: RE | Admit: 2018-09-27 | Discharge: 2018-09-27 | Disposition: A | Discharge status: Home / Self Care | Payer: Self-pay | Source: Ambulatory Visit | Attending: Family Medicine | Admitting: Family Medicine

## 2018-09-27 ENCOUNTER — Telehealth: Payer: Self-pay

## 2018-09-27 ENCOUNTER — Ambulatory Visit: Payer: BC Managed Care – PPO | Admitting: Family Medicine

## 2018-09-27 ENCOUNTER — Encounter: Payer: Self-pay | Admitting: Family Medicine

## 2018-09-27 VITALS — BP 135/85 | HR 81 | Temp 98.3°F | Resp 17 | Ht 71.0 in | Wt 244.2 lb

## 2018-09-27 DIAGNOSIS — R1011 Right upper quadrant pain: Secondary | ICD-10-CM

## 2018-09-27 DIAGNOSIS — M25511 Pain in right shoulder: Secondary | ICD-10-CM

## 2018-09-27 DIAGNOSIS — R350 Frequency of micturition: Secondary | ICD-10-CM

## 2018-09-27 DIAGNOSIS — Z23 Encounter for immunization: Secondary | ICD-10-CM

## 2018-09-27 DIAGNOSIS — K5730 Diverticulosis of large intestine without perforation or abscess without bleeding: Secondary | ICD-10-CM | POA: Diagnosis not present

## 2018-09-27 LAB — COMPREHENSIVE METABOLIC PANEL
ALT: 32 IU/L (ref 0–44)
AST: 29 IU/L (ref 0–40)
Albumin/Globulin Ratio: 2.5 — ABNORMAL HIGH (ref 1.2–2.2)
Albumin: 4.7 g/dL (ref 4.0–5.0)
Alkaline Phosphatase: 48 IU/L (ref 39–117)
BUN/Creatinine Ratio: 22 — ABNORMAL HIGH (ref 9–20)
BUN: 21 mg/dL (ref 6–24)
Bilirubin Total: 0.5 mg/dL (ref 0.0–1.2)
CO2: 24 mmol/L (ref 20–29)
Calcium: 8.9 mg/dL (ref 8.7–10.2)
Chloride: 103 mmol/L (ref 96–106)
Creatinine, Ser: 0.96 mg/dL (ref 0.76–1.27)
GFR calc Af Amer: 108 mL/min/{1.73_m2} (ref >59)
GFR calc non Af Amer: 94 mL/min/{1.73_m2} (ref >59)
Globulin, Total: 1.9 g/dL (ref 1.5–4.5)
Glucose: 116 mg/dL — ABNORMAL HIGH (ref 65–99)
Potassium: 4.5 mmol/L (ref 3.5–5.2)
Sodium: 137 mmol/L (ref 134–144)
Total Protein: 6.6 g/dL (ref 6.0–8.5)

## 2018-09-27 LAB — POCT URINALYSIS DIP (MANUAL ENTRY)
Bilirubin, UA: NEGATIVE
Blood, UA: NEGATIVE
Glucose, UA: NEGATIVE mg/dL
Ketones, POC UA: NEGATIVE mg/dL
Leukocytes, UA: NEGATIVE
Nitrite, UA: NEGATIVE
Protein Ur, POC: NEGATIVE mg/dL
Spec Grav, UA: 1.025 (ref 1.010–1.025)
Urobilinogen, UA: 0.2 E.U./dL
pH, UA: 7.5 (ref 5.0–8.0)

## 2018-09-27 LAB — LIPASE: Lipase: 23 U/L (ref 13–78)

## 2018-09-27 LAB — CBC
Hematocrit: 40.8 % (ref 37.5–51.0)
Hemoglobin: 14.3 g/dL (ref 13.0–17.7)
MCH: 31.1 pg (ref 26.6–33.0)
MCHC: 35 g/dL (ref 31.5–35.7)
MCV: 89 fL (ref 79–97)
Platelets: 253 10*3/uL (ref 150–450)
RBC: 4.6 x10E6/uL (ref 4.14–5.80)
RDW: 14 % (ref 11.6–15.4)
WBC: 5.7 10*3/uL (ref 3.4–10.8)

## 2018-09-27 MED ORDER — IOHEXOL 300 MG/ML  SOLN
100.0000 mL | Freq: Once | INTRAMUSCULAR | Status: AC | PRN
  Administered 2018-09-27: 100 mL via INTRAVENOUS

## 2018-09-27 MED ORDER — SODIUM CHLORIDE (PF) 0.9 % IJ SOLN
INTRAMUSCULAR | Status: AC
  Filled 2018-09-27: qty 50

## 2018-09-27 NOTE — Progress Notes (Addendum)
Patient ID: Billy Bray, male    DOB: 1970/12/31  Age: 48 y.o. MRN: [id number]  Chief Complaint  Patient presents with  . severe abdominal pain that radiates into back x couple weeks    pain is near pancreas per pt, per pt pain consistent and hurts to expand chest.  Pain level 5/10 and constant.  Some dysuria and straining.  Hurts to bend over and trouble sleeping at night due to pain  . Medication Refill    nexium    Subjective:   48 year old man who presents with history of having pain in the right upper quadrant of his abdomen and in his right subscapular area.  This is been going on since a week, but is better today.  He was placed on omeprazole when he saw his doctor a couple of months ago.  However this is gotten progressive Lee worse.  He is not having nausea and vomiting.  Occasionally it cramps but most of the time is just a constant hurting there.  It hurts to take a deep breath.  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  There is no family history of gallbladder disease that he is aware of.  He is on medications for chronic pain and for attention deficit disorder.  He has had urinary frequency.  Diabetic studies were good this summer.  He has a remote history of multiple kidney stones but this is not like that.  Current allergies, medications, problem list, past/family and social histories reviewed.  Objective:  BP 135/85 (BP Location: Right Arm, Patient Position: Sitting, Cuff Size: Large)   Pulse 81   Temp 98.3 F (36.8 C) (Oral)   Resp 17   Ht 5\' 11"  (1.803 m)   Wt 244 lb 3.2 oz (110.8 kg)   SpO2 98%   BMI 34.06 kg/m   Alert gentleman in no major distress.  Generally healthy in appearance.  Chest is clear to auscultation.  Heart regular without murmur.  Abdomen has bowel sounds present.  Soft without again a megaly or masses.  However the right upper quadrant is definitely tender diffusely.  The liver  does not feel palpably enlarged.  He is tender just below the liver on the right.  No CVA tenderness.  Assessment & Plan:   Assessment: 1. Continuous RUQ abdominal pain   2. Need for prophylactic vaccination and inoculation against influenza   3. Subscapular pain, right   4. Urinary frequency   5. RUQ pain       Plan: Suspicious for gallbladder disease.  Will get a chest with his abdominal x-rays to make sure there is nothing along the diaphragm seen.  Also checking labs.  Patient will almost certainly need an ultrasound but I will wait and see what the x-ray studies show first.  X-ray studies are negative.  We are scheduling him for a stat gallbladder ultrasound as soon as possible.  The patient understands.  Orders Placed This Encounter  Procedures  . DG Abd Acute W/Chest    Standing Status:   Future    Number of Occurrences:   1    Standing Expiration Date:   11/27/2019    Order Specific Question:   Reason for Exam (SYMPTOM  OR DIAGNOSIS REQUIRED)    Answer:   Right upper quadrant abdominal pain radiating to subscapular area in the back    Order Specific Question:   Preferred imaging location?  Answer:   External    Order Specific Question:   Radiology Contrast Protocol - do NOT remove file path    Answer:   [email or web address]  . CBC  . Comprehensive metabolic panel  . Lipase  . POCT urinalysis dipstick    No orders of the defined types were placed in this encounter.        Patient Instructions    Work-up is still pending.  Avoid rich or spicy or fatty foods.  In the event of getting acutely worse before the ultrasound is done, go to the emergency room if necessary.  If the ultrasound has not give [place] an answer, we may need to do a CT scan of the abdomen.  Do not eat until the ultrasound if it is to be today, otherwise stay empty overnight before the ultrasound.  Return as needed.  Go to an emergency room if abruptly  worse.  If you have lab work done today you will be contacted with your lab results within the next 2 weeks.  If you have not heard from [place] then please contact us. The fastest way to get your results is to register for My Chart.   IF you received an x-ray today, you will receive an invoice from [facility]. Please contact [facility] at [phone] with questions or concerns regarding your invoice.   IF you received labwork today, you will receive an invoice from [facility]. Please contact [facility] at [phone] with questions or concerns regarding your invoice.   Our billing staff will not be able to assist you with questions regarding bills from these companies.  You will be contacted with the lab results as soon as they are available. The fastest way to get your results is to activate your My Chart account. Instructions are located on the last page of this paperwork. If you have not heard from [place] regarding the results in 2 weeks, please contact this office.        Return if symptoms worsen or fail to improve.  Friday, September 4 addendum: I called the patient back.  He still has some pain, but only when he moves around.  Ultrasound and CT and labs were normal.  Reassured him.  If it gets at all worse he is to go to the emergency room if necessary on the holiday weekend.  Get rechecked if the pain continues to persist.  M assuming that the pain is musculoskeletal, but he is to get reassessed if continue to have problems. Ruben Reason, MD 09/27/2018

## 2018-09-27 NOTE — Patient Instructions (Addendum)
  Work-up is still pending.  Avoid rich or spicy or fatty foods.  In the event of getting acutely worse before the ultrasound is done, go to the emergency room if necessary.  If the ultrasound has not give [place] an answer, we may need to do a CT scan of the abdomen.  Do not eat until the ultrasound if it is to be today, otherwise stay empty overnight before the ultrasound.  Return as needed.  Go to an emergency room if abruptly worse.  If you have lab work done today you will be contacted with your lab results within the next 2 weeks.  If you have not heard from [place] then please contact us. The fastest way to get your results is to register for My Chart.   IF you received an x-ray today, you will receive an invoice from [facility]. Please contact [facility] at [phone] with questions or concerns regarding your invoice.   IF you received labwork today, you will receive an invoice from [facility]. Please contact [facility] at [phone] with questions or concerns regarding your invoice.   Our billing staff will not be able to assist you with questions regarding bills from these companies.  You will be contacted with the lab results as soon as they are available. The fastest way to get your results is to activate your My Chart account. Instructions are located on the last page of this paperwork. If you have not heard from [place] regarding the results in 2 weeks, please contact this office.

## 2018-09-27 NOTE — Telephone Encounter (Signed)
Call to pt.  Advised per Dr. Linna Darner that labs and [place] came back fine.  Would like to proceed with CT.  Scheduled for 6:00 at [facility].  Arrive at 4:00 for prep.  Changed order per MD r/t insurance approve.  Josem Kaufmann [phone].

## 2018-09-28 ENCOUNTER — Encounter: Payer: Self-pay | Admitting: Family Medicine

## 2018-10-02 NOTE — Telephone Encounter (Signed)
Please send this message to Dr. Linna Darner.  Thanks.

## 2018-10-08 ENCOUNTER — Other Ambulatory Visit: Payer: Self-pay | Admitting: Emergency Medicine

## 2018-10-08 DIAGNOSIS — R12 Heartburn: Secondary | ICD-10-CM

## 2018-10-26 DIAGNOSIS — F902 Attention-deficit hyperactivity disorder, combined type: Secondary | ICD-10-CM | POA: Diagnosis not present

## 2018-10-26 DIAGNOSIS — Z79899 Other long term (current) drug therapy: Secondary | ICD-10-CM | POA: Diagnosis not present

## 2018-10-26 DIAGNOSIS — F419 Anxiety disorder, unspecified: Secondary | ICD-10-CM | POA: Diagnosis not present

## 2018-10-26 DIAGNOSIS — F338 Other recurrent depressive disorders: Secondary | ICD-10-CM | POA: Diagnosis not present

## 2018-10-29 ENCOUNTER — Telehealth: Payer: Self-pay

## 2018-10-29 ENCOUNTER — Other Ambulatory Visit: Payer: Self-pay

## 2018-10-29 ENCOUNTER — Encounter (HOSPITAL_COMMUNITY): Payer: Self-pay | Admitting: Emergency Medicine

## 2018-10-29 ENCOUNTER — Ambulatory Visit (HOSPITAL_COMMUNITY)
Admission: EM | Admit: 2018-10-29 | Discharge: 2018-10-29 | Disposition: A | Discharge status: Home / Self Care | Payer: BC Managed Care – PPO | Attending: Family Medicine | Admitting: Family Medicine

## 2018-10-29 DIAGNOSIS — R21 Rash and other nonspecific skin eruption: Secondary | ICD-10-CM

## 2018-10-29 DIAGNOSIS — B356 Tinea cruris: Secondary | ICD-10-CM

## 2018-10-29 MED ORDER — KETOCONAZOLE 2 % EX CREA: 1.0000 "application " | TOPICAL_CREAM | Freq: Every day | CUTANEOUS | 0 refills | Status: DC

## 2018-10-29 MED ORDER — TRIAMCINOLONE ACETONIDE 0.1 % EX OINT: 1.0000 "application " | TOPICAL_OINTMENT | Freq: Two times a day (BID) | CUTANEOUS | 0 refills | Status: DC

## 2018-10-29 NOTE — ED Triage Notes (Signed)
Pt sts rash in groin area that he feels is heat rash

## 2018-10-29 NOTE — ED Provider Notes (Signed)
[facility]    CSN: [id number] Arrival date & time: 10/29/18  1202      History   Chief Complaint Chief Complaint  Patient presents with  . Appointment    1210  . Rash    HPI Billy Bray is a 48 y.o. male.   HPI  Patient states that he has a rash in his groin.  It is painful, itching, stinging.  It is red in the creases with little "blisters" surrounding.  Is never had this before.  He thinks is a "heat rash" from working in the heat.  He noticed it after he had excessive perspiration and moisture.  Past Medical History:  Diagnosis Date  . Allergy   . Arthritis   . Asthma   . Depression   . Kidney stones     Patient Active Problem List   Diagnosis Date Noted  . Essential hypertension 06/27/2018  . Paresthesias 06/27/2018  . Chronic pain syndrome 08/31/2017  . Degeneration of intervertebral disc of cervical region 09/13/2016  . TMJ dysfunction 08/08/2016  . Cervical spondylolysis 08/05/2016  . Polyarthralgia 05/24/2016  . Ganglion cyst of wrist, left 02/08/2016  . Asthma 09/14/2015  . Secondary male hypogonadism 06/17/2015  . ADD (attention deficit disorder) 09/02/2014  . Neuropathy 09/02/2014    Past Surgical History:  Procedure Laterality Date  . BILATERAL CARPAL TUNNEL RELEASE  2004       Home Medications    Prior to Admission medications   Medication Sig Start Date End Date Taking? Authorizing Provider  albuterol (PROVENTIL HFA;VENTOLIN HFA) 108 (90 Base) MCG/ACT inhaler Inhale 1-2 puffs into the lungs every 6 (six) hours as needed for wheezing or shortness of breath. 04/17/15   Veryl Speakalone, Gregory D, FNP  albuterol (PROVENTIL) (2.5 MG/3ML) 0.083% nebulizer solution Take 3 mLs (2.5 mg total) by nebulization every 6 (six) hours as needed for wheezing or shortness of breath. 04/17/15   Veryl Speakalone, Gregory D, FNP  Albuterol Sulfate (PROAIR RESPICLICK) 108 (90 Base) MCG/ACT AEPB Inhale 2 puffs into the lungs every 4 (four) hours as needed. 09/14/15    Veryl Speakalone, Gregory D, FNP  amLODipine (NORVASC) 5 MG tablet Take 1 tablet (5 mg total) by mouth daily. 06/27/18   Georgina QuintSagardia, Miguel Jose, MD  amphetamine-dextroamphetamine (ADDERALL XR) 30 MG 24 hr capsule Take 1 capsule (30 mg total) by mouth daily. 07/25/17   Evaristo BuryShambley, Ashleigh N, NP  amphetamine-dextroamphetamine (ADDERALL) 20 MG tablet Take 1 tablet (20 mg total) by mouth daily as needed. 07/25/17   Evaristo BuryShambley, Ashleigh N, NP  esomeprazole (NEXIUM) 40 MG capsule TAKE 1 CAPSULE BY MOUTH EVERY DAY 10/08/18   Georgina QuintSagardia, Miguel Jose, MD  gabapentin (NEURONTIN) 300 MG capsule TAKE ONE CAPSULE EVERY MORNING AND EARLY AFTERNOON. TAKE 3 CAPSULES AT BEDTIME 03/26/18   Sagardia, Eilleen KempfMiguel Jose, MD  ketoconazole (NIZORAL) 2 % cream Apply 1 application topically daily. 10/29/18   Eustace MooreNelson, Yvonne Sue, MD  triamcinolone cream (KENALOG) 0.1 % APPLY TO AFFECTED AREA TWICE A DAY 08/23/18   Georgina QuintSagardia, Miguel Jose, MD  triamcinolone ointment (KENALOG) 0.1 % Apply 1 application topically 2 (two) times daily. 10/29/18   Eustace MooreNelson, Yvonne Sue, MD    Family History Family History  Problem Relation Age of Onset  . Healthy Mother   . Healthy Father   . Stroke Maternal Grandmother   . Diabetes Maternal Grandmother   . Stroke Maternal Grandfather   . Diabetes Maternal Grandfather   . Colon cancer Paternal Grandfather     Social History  Social History   Tobacco Use  . Smoking status: Former Games developer  . Smokeless tobacco: Never Used  Substance Use Topics  . Alcohol use: No    Alcohol/week: 0.0 standard drinks  . Drug use: Yes    Types: Marijuana     Allergies   Amoxicillin   Review of Systems Review of Systems  Constitutional: Negative for chills and fever.  HENT: Negative for ear pain and sore throat.   Eyes: Negative for pain and visual disturbance.  Respiratory: Negative for cough and shortness of breath.   Cardiovascular: Negative for chest pain and palpitations.  Gastrointestinal: Negative for abdominal pain and  vomiting.  Genitourinary: Negative for dysuria and hematuria.  Musculoskeletal: Positive for arthralgias and gait problem. Negative for back pain.       Chronic right knee pain  Skin: Positive for rash. Negative for color change.  Neurological: Negative for seizures and syncope.  All other systems reviewed and are negative.    Physical Exam Triage Vital Signs ED Triage Vitals  Enc Vitals Group     BP 10/29/18 1219 (!) 152/102     Pulse Rate 10/29/18 1219 (!) 107     Resp 10/29/18 1219 17     Temp 10/29/18 1219 98.5 F (36.9 C)     Temp Source 10/29/18 1219 Tympanic     SpO2 10/29/18 1219 97 %     Weight --      Height --      Head Circumference --      Peak Flow --      Pain Score 10/29/18 1222 3     Pain Loc --      Pain Edu? --      Excl. in GC? --    No data found.  Updated Vital Signs BP (!) 152/102 (BP Location: Left Arm)   Pulse (!) 107   Temp 98.5 F (36.9 C) (Tympanic)   Resp 17   SpO2 97%      Physical Exam Constitutional:      General: He is not in acute distress.    Appearance: He is well-developed.  HENT:     Head: Normocephalic and atraumatic.  Eyes:     Conjunctiva/sclera: Conjunctivae normal.     Pupils: Pupils are equal, round, and reactive to light.  Neck:     Musculoskeletal: Normal range of motion.  Cardiovascular:     Rate and Rhythm: Normal rate.  Pulmonary:     Effort: Pulmonary effort is normal. No respiratory distress.  Abdominal:     General: There is no distension.     Palpations: Abdomen is soft.  Musculoskeletal: Normal range of motion.  Skin:    General: Skin is warm and dry.     Comments: The groin creases are erythematous and inflamed, there are a number of satellite lesions at the periphery.  No vesicles or pustules  Neurological:     Mental Status: He is alert.      UC Treatments / Results  Labs (all labs ordered are listed, but only abnormal results are displayed) Labs Reviewed - No data to display  EKG    Radiology No results found.  Procedures Procedures (including critical care time)  Medications Ordered in UC Medications - No data to display  Initial Impression / Assessment and Plan / UC Course  I have reviewed the triage vital signs and the nursing notes.  Pertinent labs & imaging results that were available during my care of the patient were  reviewed by me and considered in my medical decision making (see chart for details).      Final Clinical Impressions(s) / UC Diagnoses   Final diagnoses:  Rash and nonspecific skin eruption  Tinea cruris     Discharge Instructions     Apply the ketoconazole cream once a day.  This is the antifungal Apply the triamcinolone ointment twice a day.  This is the cortisone to take down the inflammation and irritation Try to keep area dry. Call your physician if this fails to improve   ED Prescriptions    Medication Sig Dispense Auth. Provider   ketoconazole (NIZORAL) 2 % cream Apply 1 application topically daily. 15 g Raylene Everts, MD   triamcinolone ointment (KENALOG) 0.1 % Apply 1 application topically 2 (two) times daily. 15 g Raylene Everts, MD     PDMP not reviewed this encounter.   Raylene Everts, MD 10/29/18 1329

## 2018-10-29 NOTE — Discharge Instructions (Addendum)
Apply the ketoconazole cream once a day.  This is the antifungal Apply the triamcinolone ointment twice a day.  This is the cortisone to take down the inflammation and irritation Try to keep area dry. Call your physician if this fails to improve

## 2018-11-02 ENCOUNTER — Other Ambulatory Visit: Payer: Self-pay | Admitting: Emergency Medicine

## 2018-11-02 DIAGNOSIS — G894 Chronic pain syndrome: Secondary | ICD-10-CM

## 2018-12-14 ENCOUNTER — Other Ambulatory Visit: Payer: Self-pay | Admitting: Emergency Medicine

## 2018-12-14 DIAGNOSIS — R21 Rash and other nonspecific skin eruption: Secondary | ICD-10-CM

## 2018-12-25 ENCOUNTER — Other Ambulatory Visit: Payer: Self-pay

## 2018-12-25 ENCOUNTER — Ambulatory Visit: Payer: BC Managed Care – PPO | Admitting: Emergency Medicine

## 2018-12-25 ENCOUNTER — Encounter: Payer: Self-pay | Admitting: Emergency Medicine

## 2018-12-25 VITALS — BP 134/98 | HR 98 | Temp 98.9°F | Resp 16 | Ht 71.0 in | Wt 239.2 lb

## 2018-12-25 DIAGNOSIS — I10 Essential (primary) hypertension: Secondary | ICD-10-CM

## 2018-12-25 DIAGNOSIS — G8929 Other chronic pain: Secondary | ICD-10-CM

## 2018-12-25 DIAGNOSIS — M25511 Pain in right shoulder: Secondary | ICD-10-CM

## 2018-12-25 DIAGNOSIS — M2550 Pain in unspecified joint: Secondary | ICD-10-CM

## 2018-12-25 DIAGNOSIS — G894 Chronic pain syndrome: Secondary | ICD-10-CM | POA: Diagnosis not present

## 2018-12-25 DIAGNOSIS — Z23 Encounter for immunization: Secondary | ICD-10-CM

## 2018-12-25 DIAGNOSIS — F988 Other specified behavioral and emotional disorders with onset usually occurring in childhood and adolescence: Secondary | ICD-10-CM

## 2018-12-25 MED ORDER — KETOCONAZOLE 2 % EX CREA: 1.0000 "application " | TOPICAL_CREAM | Freq: Every day | CUTANEOUS | 3 refills | Status: DC

## 2018-12-25 NOTE — Patient Instructions (Addendum)
   If you have lab work done today you will be contacted with your lab results within the next 2 weeks.  If you have not heard from us then please contact us. The fastest way to get your results is to register for My Chart.   IF you received an x-ray today, you will receive an invoice from [facility]. Please contact [facility] at [phone] with questions or concerns regarding your invoice.   IF you received labwork today, you will receive an invoice from [facility]. Please contact [facility] at [phone] with questions or concerns regarding your invoice.   Our billing staff will not be able to assist you with questions regarding bills from these companies.  You will be contacted with the lab results as soon as they are available. The fastest way to get your results is to activate your My Chart account. Instructions are located on the last page of this paperwork. If you have not heard from us regarding the results in 2 weeks, please contact this office.     Hypertension, Adult High blood pressure (hypertension) is when the force of blood pumping through the arteries is too strong. The arteries are the blood vessels that carry blood from the heart throughout the body. Hypertension forces the heart to work harder to pump blood and may cause arteries to become narrow or stiff. Untreated or uncontrolled hypertension can cause a heart attack, heart failure, a stroke, kidney disease, and other problems. A blood pressure reading consists of a higher number over a lower number. Ideally, your blood pressure should be below 120/80. The first ("top") number is called the systolic pressure. It is a measure of the pressure in your arteries as your heart beats. The second ("bottom") number is called the diastolic pressure. It is a measure of the pressure in your arteries as the heart relaxes. What are the causes? The exact cause of this condition is not known. There are some conditions  that result in or are related to high blood pressure. What increases the risk? Some risk factors for high blood pressure are under your control. The following factors may make you more likely to develop this condition:  Smoking.  Having type 2 diabetes mellitus, high cholesterol, or both.  Not getting enough exercise or physical activity.  Being overweight.  Having too much fat, sugar, calories, or salt (sodium) in your diet.  Drinking too much alcohol. Some risk factors for high blood pressure may be difficult or impossible to change. Some of these factors include:  Having chronic kidney disease.  Having a family history of high blood pressure.  Age. Risk increases with age.  Race. You may be at higher risk if you are African American.  Gender. Men are at higher risk than women before age 45. After age 65, women are at higher risk than men.  Having obstructive sleep apnea.  Stress. What are the signs or symptoms? High blood pressure may not cause symptoms. Very high blood pressure (hypertensive crisis) may cause:  Headache.  Anxiety.  Shortness of breath.  Nosebleed.  Nausea and vomiting.  Vision changes.  Severe chest pain.  Seizures. How is this diagnosed? This condition is diagnosed by measuring your blood pressure while you are seated, with your arm resting on a flat surface, your legs uncrossed, and your feet flat on the floor. The cuff of the blood pressure monitor will be placed directly against the skin of your upper arm at the level of your heart.   It should be measured at least twice using the same arm. Certain conditions can cause a difference in blood pressure between your right and left arms. Certain factors can cause blood pressure readings to be lower or higher than normal for a short period of time:  When your blood pressure is higher when you are in a health care provider's office than when you are at home, this is called white coat hypertension.  Most people with this condition do not need medicines.  When your blood pressure is higher at home than when you are in a health care provider's office, this is called masked hypertension. Most people with this condition may need medicines to control blood pressure. If you have a high blood pressure reading during one visit or you have normal blood pressure with other risk factors, you may be asked to:  Return on a different day to have your blood pressure checked again.  Monitor your blood pressure at home for 1 week or longer. If you are diagnosed with hypertension, you may have other blood or imaging tests to help your health care provider understand your overall risk for other conditions. How is this treated? This condition is treated by making healthy lifestyle changes, such as eating healthy foods, exercising more, and reducing your alcohol intake. Your health care provider may prescribe medicine if lifestyle changes are not enough to get your blood pressure under control, and if:  Your systolic blood pressure is above 130.  Your diastolic blood pressure is above 80. Your personal target blood pressure may vary depending on your medical conditions, your age, and other factors. Follow these instructions at home: Eating and drinking   Eat a diet that is high in fiber and potassium, and low in sodium, added sugar, and fat. An example eating plan is called the DASH (Dietary Approaches to Stop Hypertension) diet. To eat this way: ? Eat plenty of fresh fruits and vegetables. Try to fill one half of your plate at each meal with fruits and vegetables. ? Eat whole grains, such as whole-wheat pasta, brown rice, or whole-grain bread. Fill about one fourth of your plate with whole grains. ? Eat or drink low-fat dairy products, such as skim milk or low-fat yogurt. ? Avoid fatty cuts of meat, processed or cured meats, and poultry with skin. Fill about one fourth of your plate with lean proteins, such  as fish, chicken without skin, beans, eggs, or tofu. ? Avoid pre-made and processed foods. These tend to be higher in sodium, added sugar, and fat.  Reduce your daily sodium intake. Most people with hypertension should eat less than 1,500 mg of sodium a day.  Do not drink alcohol if: ? Your health care provider tells you not to drink. ? You are pregnant, may be pregnant, or are planning to become pregnant.  If you drink alcohol: ? Limit how much you use to:  0-1 drink a day for women.  0-2 drinks a day for men. ? Be aware of how much alcohol is in your drink. In the [place] one drink equals one 12 oz bottle of beer (355 mL), one 5 oz glass of wine (148 mL), or one 1 oz glass of hard liquor (44 mL). Lifestyle   Work with your health care provider to maintain a healthy body weight or to lose weight. Ask what an ideal weight is for you.  Get at least 30 minutes of exercise most days of the week. Activities may include walking, swimming, or   biking.  Include exercise to strengthen your muscles (resistance exercise), such as Pilates or lifting weights, as part of your weekly exercise routine. Try to do these types of exercises for 30 minutes at least 3 days a week.  Do not use any products that contain nicotine or tobacco, such as cigarettes, e-cigarettes, and chewing tobacco. If you need help quitting, ask your health care provider.  Monitor your blood pressure at home as told by your health care provider.  Keep all follow-up visits as told by your health care provider. This is important. Medicines  Take over-the-counter and prescription medicines only as told by your health care provider. Follow directions carefully. Blood pressure medicines must be taken as prescribed.  Do not skip doses of blood pressure medicine. Doing this puts you at risk for problems and can make the medicine less effective.  Ask your health care provider about side effects or reactions to medicines that you  should watch for. Contact a health care provider if you:  Think you are having a reaction to a medicine you are taking.  Have headaches that keep coming back (recurring).  Feel dizzy.  Have swelling in your ankles.  Have trouble with your vision. Get help right away if you:  Develop a severe headache or confusion.  Have unusual weakness or numbness.  Feel faint.  Have severe pain in your chest or abdomen.  Vomit repeatedly.  Have trouble breathing. Summary  Hypertension is when the force of blood pumping through your arteries is too strong. If this condition is not controlled, it may put you at risk for serious complications.  Your personal target blood pressure may vary depending on your medical conditions, your age, and other factors. For most people, a normal blood pressure is less than 120/80.  Hypertension is treated with lifestyle changes, medicines, or a combination of both. Lifestyle changes include losing weight, eating a healthy, low-sodium diet, exercising more, and limiting alcohol. This information is not intended to replace advice given to you by your health care provider. Make sure you discuss any questions you have with your health care provider. Document Released: 01/10/2005 Document Revised: 09/20/2017 Document Reviewed: 09/20/2017 Elsevier Patient Education  2020 [employer]  Shoulder Pain Many things can cause shoulder pain, including:  An injury.  Moving the shoulder in the same way again and again (overuse).  Joint pain (arthritis). Pain can come from:  Swelling and irritation (inflammation) of any part of the shoulder.  An injury to the shoulder joint.  An injury to: ? Tissues that connect muscle to bone (tendons). ? Tissues that connect bones to each other (ligaments). ? Bones. Follow these instructions at home: Watch for changes in your symptoms. Let your doctor know about them. Follow these instructions to help with your pain. If  you have a sling:  Wear the sling as told by your doctor. Remove it only as told by your doctor.  Loosen the sling if your fingers: ? Tingle. ? Become numb. ? Turn cold and blue.  Keep the sling clean.  If the sling is not waterproof: ? Do not let it get wet. ? Take the sling off when you shower or bathe. Managing pain, stiffness, and swelling   If told, put ice on the painful area: ? Put ice in a plastic bag. ? Place a towel between your skin and the bag. ? Leave the ice on for 20 minutes, 2-3 times a day. Stop putting ice on if it does not help  with the pain.  Squeeze a soft ball or a foam pad as much as possible. This prevents swelling in the shoulder. It also helps to strengthen the arm. General instructions  Take over-the-counter and prescription medicines only as told by your doctor.  Keep all follow-up visits as told by your doctor. This is important. Contact a doctor if:  Your pain gets worse.  Medicine does not help your pain.  You have new pain in your arm, hand, or fingers. Get help right away if:  Your arm, hand, or fingers: ? Tingle. ? Are numb. ? Are swollen. ? Are painful. ? Turn white or blue. Summary  Shoulder pain can be caused by many things. These include injury, moving the shoulder in the same away again and again, and joint pain.  Watch for changes in your symptoms. Let your doctor know about them.  This condition may be treated with a sling, ice, and pain medicine.  Contact your doctor if the pain gets worse or you have new pain. Get help right away if your arm, hand, or fingers tingle or get numb, swollen, or painful.  Keep all follow-up visits as told by your doctor. This is important. This information is not intended to replace advice given to you by your health care provider. Make sure you discuss any questions you have with your health care provider. Document Released: 06/29/2007 Document Revised: 07/25/2017 Document Reviewed:  07/25/2017 Elsevier Patient Education  2020 [employer].

## 2018-12-25 NOTE — Progress Notes (Signed)
BP Readings from Last 3 Encounters:  12/25/18 (!) 134/98  10/29/18 (!) 152/102  09/27/18 135/85   Billy Bray 48 y.o.   Chief Complaint  Patient presents with  . Shoulder Pain    RIGHT x 2 weeks with popping sound when lifted and follow up 6 months    HISTORY OF PRESENT ILLNESS: This is a 48 y.o. male with some chronic medical problems including hypertension, ADD, chronic polyarthralgia pain here for follow-up and also complaining of pain to right shoulder for the past several weeks.  Denies injury.  Feels an occasional "popping" in his right shoulder during range of motion.  Physical work.  No other complaints or medical concerns today.  HPI   Prior to Admission medications   Medication Sig Start Date End Date Taking? Authorizing Provider  albuterol (PROVENTIL HFA;VENTOLIN HFA) 108 (90 Base) MCG/ACT inhaler Inhale 1-2 puffs into the lungs every 6 (six) hours as needed for wheezing or shortness of breath. 04/17/15  Yes Veryl Speak, FNP  albuterol (PROVENTIL) (2.5 MG/3ML) 0.083% nebulizer solution Take 3 mLs (2.5 mg total) by nebulization every 6 (six) hours as needed for wheezing or shortness of breath. 04/17/15  Yes Veryl Speak, FNP  Albuterol Sulfate (PROAIR RESPICLICK) 108 (90 Base) MCG/ACT AEPB Inhale 2 puffs into the lungs every 4 (four) hours as needed. 09/14/15  Yes Veryl Speak, FNP  amLODipine (NORVASC) 5 MG tablet Take 1 tablet (5 mg total) by mouth daily. 06/27/18  Yes Nicolae Vasek, Eilleen Kempf, MD  amphetamine-dextroamphetamine (ADDERALL XR) 30 MG 24 hr capsule Take 1 capsule (30 mg total) by mouth daily. 07/25/17  Yes Shambley, Audie Box, NP  amphetamine-dextroamphetamine (ADDERALL) 20 MG tablet Take 1 tablet (20 mg total) by mouth daily as needed. 07/25/17  Yes Evaristo Bury, NP  esomeprazole (NEXIUM) 40 MG capsule TAKE 1 CAPSULE BY MOUTH EVERY DAY 10/08/18  Yes Derril Franek, Eilleen Kempf, MD  gabapentin (NEURONTIN) 300 MG capsule TAKE ONE CAPSULE EVERY MORNING AND  EARLY AFTERNOON. TAKE 3 CAPSULES AT BEDTIME 11/02/18  Yes Shatira Dobosz, Eilleen Kempf, MD  ketoconazole (NIZORAL) 2 % cream Apply 1 application topically daily. 10/29/18  Yes Eustace Moore, MD  triamcinolone cream (KENALOG) 0.1 % APPLY TO AFFECTED AREA TWICE A DAY 12/14/18  Yes Lazaria Schaben, Eilleen Kempf, MD  triamcinolone ointment (KENALOG) 0.1 % Apply 1 application topically 2 (two) times daily. 10/29/18  Yes Eustace Moore, MD    Allergies  Allergen Reactions  . Amoxicillin Hives    Patient Active Problem List   Diagnosis Date Noted  . Essential hypertension 06/27/2018  . Paresthesias 06/27/2018  . Chronic pain syndrome 08/31/2017  . Degeneration of intervertebral disc of cervical region 09/13/2016  . TMJ dysfunction 08/08/2016  . Cervical spondylolysis 08/05/2016  . Polyarthralgia 05/24/2016  . Asthma 09/14/2015  . Secondary male hypogonadism 06/17/2015  . ADD (attention deficit disorder) 09/02/2014  . Neuropathy 09/02/2014    Past Medical History:  Diagnosis Date  . Allergy   . Arthritis   . Asthma   . Depression   . Kidney stones     Past Surgical History:  Procedure Laterality Date  . BILATERAL CARPAL TUNNEL RELEASE  2004    Social History   Socioeconomic History  . Marital status: Divorced    Spouse name: Not on file  . Number of children: 2  . Years of education: 15  . Highest education level: Not on file  Occupational History  . Occupation: Engineer, site  .  Financial resource strain: Not on file  . Food insecurity    Worry: Not on file    Inability: Not on file  . Transportation needs    Medical: Not on file    Non-medical: Not on file  Tobacco Use  . Smoking status: Former Research scientist (life sciences)  . Smokeless tobacco: Never Used  Substance and Sexual Activity  . Alcohol use: No    Alcohol/week: 0.0 standard drinks  . Drug use: Yes    Types: Marijuana  . Sexual activity: Not Currently  Lifestyle  . Physical activity    Days per week: Not on file     Minutes per session: Not on file  . Stress: Not on file  Relationships  . Social Herbalist on phone: Not on file    Gets together: Not on file    Attends religious service: Not on file    Active member of club or organization: Not on file    Attends meetings of clubs or organizations: Not on file    Relationship status: Not on file  . Intimate partner violence    Fear of current or ex partner: Not on file    Emotionally abused: Not on file    Physically abused: Not on file    Forced sexual activity: Not on file  Other Topics Concern  . Not on file  Social History Narrative   Fun: Entertainment sounds/lights for concerts,   Denies religious beliefs effecting health care.     Family History  Problem Relation Age of Onset  . Healthy Mother   . Healthy Father   . Stroke Maternal Grandmother   . Diabetes Maternal Grandmother   . Stroke Maternal Grandfather   . Diabetes Maternal Grandfather   . Colon cancer Paternal Grandfather      Review of Systems  Constitutional: Negative.  Negative for chills and fever.  HENT: Negative.  Negative for congestion and sore throat.   Eyes: Negative.   Respiratory: Negative.  Negative for cough and shortness of breath.   Cardiovascular: Negative.  Negative for chest pain and palpitations.  Gastrointestinal: Negative.  Negative for abdominal pain, blood in stool, diarrhea, nausea and vomiting.  Genitourinary: Negative.   Musculoskeletal: Positive for joint pain (Right shoulder).  Skin: Negative.  Negative for rash.  Neurological: Negative.  Negative for dizziness and headaches.  Endo/Heme/Allergies: Negative.   All other systems reviewed and are negative.   Today's Vitals   12/25/18 1324  BP: (!) 134/98  Pulse: 98  Resp: 16  Temp: 98.9 F (37.2 C)  TempSrc: Oral  SpO2: 97%  Weight: 239 lb 3.2 oz (108.5 kg)  Height: 5\' 11"  (1.803 m)   Body mass index is 33.36 kg/m.  Physical Exam Vitals signs reviewed.   Constitutional:      Appearance: Normal appearance.  HENT:     Head: Normocephalic.  Eyes:     Extraocular Movements: Extraocular movements intact.     Conjunctiva/sclera: Conjunctivae normal.     Pupils: Pupils are equal, round, and reactive to light.  Neck:     Musculoskeletal: Normal range of motion and neck supple.  Cardiovascular:     Rate and Rhythm: Normal rate and regular rhythm.     Pulses: Normal pulses.     Heart sounds: Normal heart sounds.  Pulmonary:     Effort: Pulmonary effort is normal.     Breath sounds: Normal breath sounds.  Musculoskeletal:     Comments: Right shoulder: No crepitation.  Full range of motion but complaining of pain.  No localized tenderness.  No swelling. Right upper extremity: Within normal limits.  Neurovascularly intact.  Skin:    General: Skin is warm and dry.     Capillary Refill: Capillary refill takes less than 2 seconds.  Neurological:     General: No focal deficit present.     Mental Status: He is alert and oriented to person, place, and time.  Psychiatric:        Mood and Affect: Mood normal.        Behavior: Behavior normal.      ASSESSMENT & PLAN: Clinically stable.  No medical concerns identified during this visit.  Continue present medications.  No changes. Orthopedic evaluation for his chronic right shoulder pain. A total of 25 minutes was spent in the room with the patient, greater than 50% of which was in counseling/coordination of care regarding chronic medical problems, treatment, medication review, diet and nutrition, differential diagnosis of right shoulder pain, need for orthopedic evaluation and possible MRI, prognosis and need for follow-up.  Bolivar was seen today for shoulder pain.  Diagnoses and all orders for this visit:  Essential hypertension  Need for prophylactic vaccination and inoculation against influenza -     Flu Vaccine QUAD 36+ mos IM  Attention deficit disorder (ADD) without hyperactivity   Polyarthralgia  Chronic pain syndrome  Chronic right shoulder pain -     Ambulatory referral to Orthopedic Surgery  Other orders -     ketoconazole (NIZORAL) 2 % cream; Apply 1 application topically daily.    Patient Instructions       If you have lab work done today you will be contacted with your lab results within the next 2 weeks.  If you have not heard from [place] then please contact us. The fastest way to get your results is to register for My Chart.   IF you received an x-ray today, you will receive an invoice from [facility]. Please contact [facility] at [phone] with questions or concerns regarding your invoice.   IF you received labwork today, you will receive an invoice from [facility]. Please contact [facility] at [phone] with questions or concerns regarding your invoice.   Our billing staff will not be able to assist you with questions regarding bills from these companies.  You will be contacted with the lab results as soon as they are available. The fastest way to get your results is to activate your My Chart account. Instructions are located on the last page of this paperwork. If you have not heard from [place] regarding the results in 2 weeks, please contact this office.     Hypertension, Adult High blood pressure (hypertension) is when the force of blood pumping through the arteries is too strong. The arteries are the blood vessels that carry blood from the heart throughout the body. Hypertension forces the heart to work harder to pump blood and may cause arteries to become narrow or stiff. Untreated or uncontrolled hypertension can cause a heart attack, heart failure, a stroke, kidney disease, and other problems. A blood pressure reading consists of a higher number over a lower number. Ideally, your blood pressure should be below 120/80. The first ("top") number is called the systolic pressure. It is a measure of the pressure in your arteries as  your heart beats. The second ("bottom") number is called the diastolic pressure. It is a measure of the pressure in your arteries as the heart relaxes. What are  the causes? The exact cause of this condition is not known. There are some conditions that result in or are related to high blood pressure. What increases the risk? Some risk factors for high blood pressure are under your control. The following factors may make you more likely to develop this condition:  Smoking.  Having type 2 diabetes mellitus, high cholesterol, or both.  Not getting enough exercise or physical activity.  Being overweight.  Having too much fat, sugar, calories, or salt (sodium) in your diet.  Drinking too much alcohol. Some risk factors for high blood pressure may be difficult or impossible to change. Some of these factors include:  Having chronic kidney disease.  Having a family history of high blood pressure.  Age. Risk increases with age.  Race. You may be at higher risk if you are African American.  Gender. Men are at higher risk than women before age 37. After age 59, women are at higher risk than men.  Having obstructive sleep apnea.  Stress. What are the signs or symptoms? High blood pressure may not cause symptoms. Very high blood pressure (hypertensive crisis) may cause:  Headache.  Anxiety.  Shortness of breath.  Nosebleed.  Nausea and vomiting.  Vision changes.  Severe chest pain.  Seizures. How is this diagnosed? This condition is diagnosed by measuring your blood pressure while you are seated, with your arm resting on a flat surface, your legs uncrossed, and your feet flat on the floor. The cuff of the blood pressure monitor will be placed directly against the skin of your upper arm at the level of your heart. It should be measured at least twice using the same arm. Certain conditions can cause a difference in blood pressure between your right and left arms. Certain factors  can cause blood pressure readings to be lower or higher than normal for a short period of time:  When your blood pressure is higher when you are in a health care provider's office than when you are at home, this is called white coat hypertension. Most people with this condition do not need medicines.  When your blood pressure is higher at home than when you are in a health care provider's office, this is called masked hypertension. Most people with this condition may need medicines to control blood pressure. If you have a high blood pressure reading during one visit or you have normal blood pressure with other risk factors, you may be asked to:  Return on a different day to have your blood pressure checked again.  Monitor your blood pressure at home for 1 week or longer. If you are diagnosed with hypertension, you may have other blood or imaging tests to help your health care provider understand your overall risk for other conditions. How is this treated? This condition is treated by making healthy lifestyle changes, such as eating healthy foods, exercising more, and reducing your alcohol intake. Your health care provider may prescribe medicine if lifestyle changes are not enough to get your blood pressure under control, and if:  Your systolic blood pressure is above 130.  Your diastolic blood pressure is above 80. Your personal target blood pressure may vary depending on your medical conditions, your age, and other factors. Follow these instructions at home: Eating and drinking   Eat a diet that is high in fiber and potassium, and low in sodium, added sugar, and fat. An example eating plan is called the DASH (Dietary Approaches to Stop Hypertension) diet. To eat this  way: ? Eat plenty of fresh fruits and vegetables. Try to fill one half of your plate at each meal with fruits and vegetables. ? Eat whole grains, such as whole-wheat pasta, brown rice, or whole-grain bread. Fill about one fourth  of your plate with whole grains. ? Eat or drink low-fat dairy products, such as skim milk or low-fat yogurt. ? Avoid fatty cuts of meat, processed or cured meats, and poultry with skin. Fill about one fourth of your plate with lean proteins, such as fish, chicken without skin, beans, eggs, or tofu. ? Avoid pre-made and processed foods. These tend to be higher in sodium, added sugar, and fat.  Reduce your daily sodium intake. Most people with hypertension should eat less than 1,500 mg of sodium a day.  Do not drink alcohol if: ? Your health care provider tells you not to drink. ? You are pregnant, may be pregnant, or are planning to become pregnant.  If you drink alcohol: ? Limit how much you use to:  0-1 drink a day for women.  0-2 drinks a day for men. ? Be aware of how much alcohol is in your drink. In the [place] one drink equals one 12 oz bottle of beer (355 mL), one 5 oz glass of wine (148 mL), or one 1 oz glass of hard liquor (44 mL). Lifestyle   Work with your health care provider to maintain a healthy body weight or to lose weight. Ask what an ideal weight is for you.  Get at least 30 minutes of exercise most days of the week. Activities may include walking, swimming, or biking.  Include exercise to strengthen your muscles (resistance exercise), such as Pilates or lifting weights, as part of your weekly exercise routine. Try to do these types of exercises for 30 minutes at least 3 days a week.  Do not use any products that contain nicotine or tobacco, such as cigarettes, e-cigarettes, and chewing tobacco. If you need help quitting, ask your health care provider.  Monitor your blood pressure at home as told by your health care provider.  Keep all follow-up visits as told by your health care provider. This is important. Medicines  Take over-the-counter and prescription medicines only as told by your health care provider. Follow directions carefully. Blood pressure medicines  must be taken as prescribed.  Do not skip doses of blood pressure medicine. Doing this puts you at risk for problems and can make the medicine less effective.  Ask your health care provider about side effects or reactions to medicines that you should watch for. Contact a health care provider if you:  Think you are having a reaction to a medicine you are taking.  Have headaches that keep coming back (recurring).  Feel dizzy.  Have swelling in your ankles.  Have trouble with your vision. Get help right away if you:  Develop a severe headache or confusion.  Have unusual weakness or numbness.  Feel faint.  Have severe pain in your chest or abdomen.  Vomit repeatedly.  Have trouble breathing. Summary  Hypertension is when the force of blood pumping through your arteries is too strong. If this condition is not controlled, it may put you at risk for serious complications.  Your personal target blood pressure may vary depending on your medical conditions, your age, and other factors. For most people, a normal blood pressure is less than 120/80.  Hypertension is treated with lifestyle changes, medicines, or a combination of both. Lifestyle changes include losing  weight, eating a healthy, low-sodium diet, exercising more, and limiting alcohol. This information is not intended to replace advice given to you by your health care provider. Make sure you discuss any questions you have with your health care provider. Document Released: 01/10/2005 Document Revised: 09/20/2017 Document Reviewed: 09/20/2017 Elsevier Patient Education  2020 Else[employer] Inc.  Shoulder Pain Many things can cause shoulder pain, including:  An injury.  Moving the shoulder in the same way again and again (overuse).  Joint pain (arthritis). Pain can come from:  Swelling and irritation (inflammation) of any part of the shoulder.  An injury to the shoulder joint.  An injury to: ? Tissues that connect muscle  to bone (tendons). ? Tissues that connect bones to each other (ligaments). ? Bones. Follow these instructions at home: Watch for changes in your symptoms. Let your doctor know about them. Follow these instructions to help with your pain. If you have a sling:  Wear the sling as told by your doctor. Remove it only as told by your doctor.  Loosen the sling if your fingers: ? Tingle. ? Become numb. ? Turn cold and blue.  Keep the sling clean.  If the sling is not waterproof: ? Do not let it get wet. ? Take the sling off when you shower or bathe. Managing pain, stiffness, and swelling   If told, put ice on the painful area: ? Put ice in a plastic bag. ? Place a towel between your skin and the bag. ? Leave the ice on for 20 minutes, 2-3 times a day. Stop putting ice on if it does not help with the pain.  Squeeze a soft ball or a foam pad as much as possible. This prevents swelling in the shoulder. It also helps to strengthen the arm. General instructions  Take over-the-counter and prescription medicines only as told by your doctor.  Keep all follow-up visits as told by your doctor. This is important. Contact a doctor if:  Your pain gets worse.  Medicine does not help your pain.  You have new pain in your arm, hand, or fingers. Get help right away if:  Your arm, hand, or fingers: ? Tingle. ? Are numb. ? Are swollen. ? Are painful. ? Turn white or blue. Summary  Shoulder pain can be caused by many things. These include injury, moving the shoulder in the same away again and again, and joint pain.  Watch for changes in your symptoms. Let your doctor know about them.  This condition may be treated with a sling, ice, and pain medicine.  Contact your doctor if the pain gets worse or you have new pain. Get help right away if your arm, hand, or fingers tingle or get numb, swollen, or painful.  Keep all follow-up visits as told by your doctor. This is important. This  information is not intended to replace advice given to you by your health care provider. Make sure you discuss any questions you have with your health care provider. Document Released: 06/29/2007 Document Revised: 07/25/2017 Document Reviewed: 07/25/2017 Elsevier Patient Education  2020 Elsevier Inc.      Edwina Barth, MD [facility]

## 2019-01-29 DIAGNOSIS — F338 Other recurrent depressive disorders: Secondary | ICD-10-CM | POA: Diagnosis not present

## 2019-01-29 DIAGNOSIS — Z79899 Other long term (current) drug therapy: Secondary | ICD-10-CM | POA: Diagnosis not present

## 2019-01-29 DIAGNOSIS — F902 Attention-deficit hyperactivity disorder, combined type: Secondary | ICD-10-CM | POA: Diagnosis not present

## 2019-01-29 DIAGNOSIS — F419 Anxiety disorder, unspecified: Secondary | ICD-10-CM | POA: Diagnosis not present

## 2019-02-15 DIAGNOSIS — Z20828 Contact with and (suspected) exposure to other viral communicable diseases: Secondary | ICD-10-CM | POA: Diagnosis not present

## 2019-03-08 DIAGNOSIS — Z20828 Contact with and (suspected) exposure to other viral communicable diseases: Secondary | ICD-10-CM | POA: Diagnosis not present

## 2019-03-10 ENCOUNTER — Encounter: Payer: Self-pay | Admitting: Emergency Medicine

## 2019-03-12 NOTE — Telephone Encounter (Signed)
Thanks

## 2019-03-13 DIAGNOSIS — Z20828 Contact with and (suspected) exposure to other viral communicable diseases: Secondary | ICD-10-CM | POA: Diagnosis not present

## 2019-03-18 DIAGNOSIS — Z20828 Contact with and (suspected) exposure to other viral communicable diseases: Secondary | ICD-10-CM | POA: Diagnosis not present

## 2019-03-19 ENCOUNTER — Encounter: Payer: Self-pay | Admitting: Emergency Medicine

## 2019-03-20 ENCOUNTER — Encounter: Payer: Self-pay | Admitting: Adult Health Nurse Practitioner

## 2019-03-20 NOTE — Telephone Encounter (Signed)
Spoke with patient he isn't having any symptoms, and he would like to return to work and I asked did he need a note if so he might have to do a Telemed with a provider to clear him back to work

## 2019-03-22 ENCOUNTER — Telehealth: Payer: Self-pay | Admitting: Emergency Medicine

## 2019-03-22 DIAGNOSIS — Z20828 Contact with and (suspected) exposure to other viral communicable diseases: Secondary | ICD-10-CM | POA: Diagnosis not present

## 2019-03-22 NOTE — Telephone Encounter (Signed)
Patient is needing a work note to return to work Patient states  has spoken with nurse already Please call patient when note is ready / Patient is wanting to return  to work by Monday .

## 2019-03-22 NOTE — Telephone Encounter (Signed)
Disregard  Message below patint already has a letter on file from [employer].He will come by to pick up

## 2019-04-01 ENCOUNTER — Other Ambulatory Visit: Payer: Self-pay | Admitting: Emergency Medicine

## 2019-04-01 DIAGNOSIS — R21 Rash and other nonspecific skin eruption: Secondary | ICD-10-CM

## 2019-04-19 ENCOUNTER — Other Ambulatory Visit: Payer: Self-pay | Admitting: Emergency Medicine

## 2019-04-25 DIAGNOSIS — Z79899 Other long term (current) drug therapy: Secondary | ICD-10-CM | POA: Diagnosis not present

## 2019-04-25 DIAGNOSIS — F902 Attention-deficit hyperactivity disorder, combined type: Secondary | ICD-10-CM | POA: Diagnosis not present

## 2019-05-02 ENCOUNTER — Encounter: Payer: Self-pay | Admitting: Emergency Medicine

## 2019-05-08 ENCOUNTER — Ambulatory Visit (INDEPENDENT_AMBULATORY_CARE_PROVIDER_SITE_OTHER): Payer: BC Managed Care – PPO | Admitting: Orthopaedic Surgery

## 2019-05-08 ENCOUNTER — Ambulatory Visit: Payer: Self-pay

## 2019-05-08 ENCOUNTER — Encounter: Payer: Self-pay | Admitting: Orthopaedic Surgery

## 2019-05-08 ENCOUNTER — Other Ambulatory Visit: Payer: Self-pay

## 2019-05-08 VITALS — Ht 71.0 in | Wt 235.0 lb

## 2019-05-08 DIAGNOSIS — G8929 Other chronic pain: Secondary | ICD-10-CM | POA: Diagnosis not present

## 2019-05-08 DIAGNOSIS — M25511 Pain in right shoulder: Secondary | ICD-10-CM | POA: Diagnosis not present

## 2019-05-08 MED ORDER — METHYLPREDNISOLONE ACETATE 40 MG/ML IJ SUSP
80.0000 mg | INTRAMUSCULAR | Status: AC | PRN
  Administered 2019-05-08: 80 mg via INTRA_ARTICULAR

## 2019-05-08 MED ORDER — LIDOCAINE HCL 2 % IJ SOLN
2.0000 mL | INTRAMUSCULAR | Status: AC | PRN
  Administered 2019-05-08: 2 mL

## 2019-05-08 MED ORDER — BUPIVACAINE HCL 0.5 % IJ SOLN
2.0000 mL | INTRAMUSCULAR | Status: AC | PRN
  Administered 2019-05-08: 2 mL via INTRA_ARTICULAR

## 2019-05-08 NOTE — Progress Notes (Signed)
Office Visit Note   Patient: Billy Bray           Date of Birth: 1970-09-09           MRN: [id number] Visit Date: 05/08/2019              Requested by: Horald Pollen, MD [place],  [place] [address] PCP: Horald Pollen, MD   Assessment & Plan: Visit Diagnoses:  1. Chronic right shoulder pain     Plan: Impingement syndrome right shoulder.  Will inject the subacromial space with cortisone and monitor response.  If no improvement would consider MRI scan as this is a chronic recurrent issue  Follow-Up Instructions: Return if symptoms worsen or fail to improve.   Orders:  Orders Placed This Encounter  Procedures  . Large Joint Inj: R subacromial bursa  . XR Shoulder Right   No orders of the defined types were placed in this encounter.     Procedures: Large Joint Inj: R subacromial bursa on 05/08/2019 5:05 PM Indications: pain and diagnostic evaluation Details: 25 G 1.5 in needle, anterolateral approach  Arthrogram: No  Medications: 2 mL lidocaine 2 %; 2 mL bupivacaine 0.5 %; 80 mg methylPREDNISolone acetate 40 MG/ML Consent was given by the patient. Immediately prior to procedure a time out was called to verify the correct patient, procedure, equipment, support staff and site/side marked as required. Patient was prepped and draped in the usual sterile fashion.       Clinical Data: No additional findings.   Subjective: Chief Complaint  Patient presents with  . Right Shoulder - Pain  Patient presents today for right shoulder pain. No known injury. Patient states that he has been hurting for about a month. 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 His pain seems to be more superior. He has "clicking" and limited range of motion. No numbness or tingling in his upper extremity. He does state that his arm is weak when in pain. He does not take anything for pain. He is right hand dominant. No previous shoulder surgery or treatment.  No  prior injury.  He denies any numbness or tingling.  He does have some trouble sleeping and raising his arm over his head  HPI  Review of Systems   Objective: Vital Signs: Ht 5\' 11"  (1.803 m)   Wt 235 lb (106.6 kg)   BMI 32.78 kg/m   Physical Exam Constitutional:      Appearance: He is well-developed.  Eyes:     Pupils: Pupils are equal, round, and reactive to light.  Pulmonary:     Effort: Pulmonary effort is normal.  Skin:    General: Skin is warm and dry.  Neurological:     Mental Status: He is alert and oriented to person, place, and time.  Psychiatric:        Behavior: Behavior normal.     Ortho Exam awake alert and oriented x3.  Comfortable sitting.  Positive impingement and with internal and external rotation right shoulder.  Good grip and release and good strength.  Able to place arm over his head with a circuitous arc of motion with some local tenderness in the anterior subacromial region.  Biceps intact.  Skin intact  Specialty Comments:  No specialty comments available.  Imaging: XR Shoulder Right  Result Date: 05/08/2019 Films of the right shoulder obtained in several projections.  No acute changes.  Humeral head is centered about the glenoid.  Normal space between the humeral head and the acromion.  Mild degenerative changes at the [facility] joint.  No ectopic calcification    PMFS History: Patient Active Problem List   Diagnosis Date Noted  . Pain in right shoulder 05/08/2019  . Essential hypertension 06/27/2018  . Paresthesias 06/27/2018  . Chronic pain syndrome 08/31/2017  . Degeneration of intervertebral disc of cervical region 09/13/2016  . TMJ dysfunction 08/08/2016  . Cervical spondylolysis 08/05/2016  . Polyarthralgia 05/24/2016  . Asthma 09/14/2015  . Secondary male hypogonadism 06/17/2015  . ADD (attention deficit disorder) 09/02/2014  . Neuropathy 09/02/2014   Past Medical History:  Diagnosis Date  . Allergy   . Arthritis   . Asthma   .  Depression   . Kidney stones     Family History  Problem Relation Age of Onset  . Healthy Mother   . Healthy Father   . Stroke Maternal Grandmother   . Diabetes Maternal Grandmother   . Stroke Maternal Grandfather   . Diabetes Maternal Grandfather   . Colon cancer Paternal Grandfather     Past Surgical History:  Procedure Laterality Date  . BILATERAL CARPAL TUNNEL RELEASE  2004   Social History   Occupational History  . Occupation: Personnel officer  Tobacco Use  . Smoking status: Former Games developer  . Smokeless tobacco: Never Used  Substance and Sexual Activity  . Alcohol use: No    Alcohol/week: 0.0 standard drinks  . Drug use: Yes    Types: Marijuana  . Sexual activity: Not Currently

## 2019-05-19 ENCOUNTER — Other Ambulatory Visit: Payer: Self-pay | Admitting: Emergency Medicine

## 2019-05-19 DIAGNOSIS — I10 Essential (primary) hypertension: Secondary | ICD-10-CM

## 2019-05-19 NOTE — Telephone Encounter (Signed)
Requested Prescriptions  Pending Prescriptions Disp Refills  . amLODipine (NORVASC) 5 MG tablet [Pharmacy Med Name: AMLODIPINE BESYLATE 5 MG TAB] 90 tablet 0    Sig: TAKE 1 TABLET BY MOUTH EVERY DAY     Cardiovascular:  Calcium Channel Blockers Failed - 05/19/2019  9:43 AM      Failed - Last BP in normal range    BP Readings from Last 1 Encounters:  12/25/18 (!) 134/98         Passed - Valid encounter within last 6 months    Recent Outpatient Visits          4 months ago Essential hypertension   [facility] at [place], [place], MD   7 months ago Continuous RUQ abdominal pain   Primary Care at [facility], Sandria Bales, MD   10 months ago Encounter for general adult medical examination with abnormal findings   Primary Care at [facility], [place], MD   12 months ago Acute costochondritis   [facility] at [facility], Eilleen Kempf, MD   1 year ago Nonspecific chest pain   Primary Care at [facility], Eilleen Kempf, MD      Future Appointments            In 1 month Sag[place], Eilleen Kempf, MD Primary [facility] at [place], [facility]

## 2019-05-30 ENCOUNTER — Other Ambulatory Visit: Payer: Self-pay | Admitting: Emergency Medicine

## 2019-05-30 DIAGNOSIS — R12 Heartburn: Secondary | ICD-10-CM

## 2019-06-04 ENCOUNTER — Other Ambulatory Visit: Payer: Self-pay | Admitting: Emergency Medicine

## 2019-06-04 DIAGNOSIS — G894 Chronic pain syndrome: Secondary | ICD-10-CM

## 2019-06-04 NOTE — Telephone Encounter (Signed)
Requested Prescriptions  Pending Prescriptions Disp Refills  . gabapentin (NEURONTIN) 300 MG capsule [Pharmacy Med Name: GABAPENTIN 300 MG CAPSULE] 450 capsule 0    Sig: TAKE ONE CAPSULE EVERY MORNING AND EARLY AFTERNOON. TAKE 3 CAPSULES AT BEDTIME     Neurology: Anticonvulsants - gabapentin Passed - 06/04/2019  6:49 PM      Passed - Valid encounter within last 12 months    Recent Outpatient Visits          5 months ago Essential hypertension   Primary [facility] at [facility], [place], MD   8 months ago Continuous RUQ abdominal pain   Primary Care at [facility], Sandria Bales, MD   11 months ago Encounter for general adult medical examination with abnormal findings   Primary Care at [facility], Eilleen Kempf, MD   1 year ago Acute costochondritis   Primary Care at [facility], Eilleen Kempf, MD   1 year ago Nonspecific chest pain   Primary Care at [facility], Eilleen Kempf, MD      Future Appointments            In 3 weeks Sagardia, Eilleen Kempf, MD Primary Care at [place], [facility]

## 2019-06-25 ENCOUNTER — Encounter: Payer: Self-pay | Admitting: Emergency Medicine

## 2019-06-25 ENCOUNTER — Other Ambulatory Visit: Payer: Self-pay

## 2019-06-25 ENCOUNTER — Ambulatory Visit: Payer: BC Managed Care – PPO | Admitting: Emergency Medicine

## 2019-06-25 VITALS — BP 135/93 | HR 97 | Temp 97.8°F | Ht 71.0 in | Wt 239.0 lb

## 2019-06-25 DIAGNOSIS — Z13220 Encounter for screening for lipoid disorders: Secondary | ICD-10-CM

## 2019-06-25 DIAGNOSIS — I10 Essential (primary) hypertension: Secondary | ICD-10-CM

## 2019-06-25 DIAGNOSIS — G894 Chronic pain syndrome: Secondary | ICD-10-CM

## 2019-06-25 DIAGNOSIS — J4520 Mild intermittent asthma, uncomplicated: Secondary | ICD-10-CM | POA: Diagnosis not present

## 2019-06-25 MED ORDER — PROAIR RESPICLICK 108 (90 BASE) MCG/ACT IN AEPB: 2.0000 | INHALATION_SPRAY | RESPIRATORY_TRACT | 2 refills | Status: DC | PRN

## 2019-06-25 MED ORDER — GABAPENTIN 300 MG PO CAPS: ORAL_CAPSULE | ORAL | 1 refills | Status: DC

## 2019-06-25 NOTE — Patient Instructions (Addendum)
   If you have lab work done today you will be contacted with your lab results within the next 2 weeks.  If you have not heard from us then please contact us. The fastest way to get your results is to register for My Chart.   IF you received an x-ray today, you will receive an invoice from [facility]. Please contact [facility] at [phone] with questions or concerns regarding your invoice.   IF you received labwork today, you will receive an invoice from [facility]. Please contact [facility] at [phone] with questions or concerns regarding your invoice.   Our billing staff will not be able to assist you with questions regarding bills from these companies.  You will be contacted with the lab results as soon as they are available. The fastest way to get your results is to activate your My Chart account. Instructions are located on the last page of this paperwork. If you have not heard from us regarding the results in 2 weeks, please contact this office.      Health Maintenance, Male Adopting a healthy lifestyle and getting preventive care are important in promoting health and wellness. Ask your health care provider about:  The right schedule for you to have regular tests and exams.  Things you can do on your own to prevent diseases and keep yourself healthy. What should I know about diet, weight, and exercise? Eat a healthy diet   Eat a diet that includes plenty of vegetables, fruits, low-fat dairy products, and lean protein.  Do not eat a lot of foods that are high in solid fats, added sugars, or sodium. Maintain a healthy weight Body mass index (BMI) is a measurement that can be used to identify possible weight problems. It estimates body fat based on height and weight. Your health care provider can help determine your BMI and help you achieve or maintain a healthy weight. Get regular exercise Get regular exercise. This is one of the most important things you  can do for your health. Most adults should:  Exercise for at least 150 minutes each week. The exercise should increase your heart rate and make you sweat (moderate-intensity exercise).  Do strengthening exercises at least twice a week. This is in addition to the moderate-intensity exercise.  Spend less time sitting. Even light physical activity can be beneficial. Watch cholesterol and blood lipids Have your blood tested for lipids and cholesterol at 48 years of age, then have this test every 5 years. You may need to have your cholesterol levels checked more often if:  Your lipid or cholesterol levels are high.  You are older than 48 years of age.  You are at high risk for heart disease. What should I know about cancer screening? Many types of cancers can be detected early and may often be prevented. Depending on your health history and family history, you may need to have cancer screening at various ages. This may include screening for:  Colorectal cancer.  Prostate cancer.  Skin cancer.  Lung cancer. What should I know about heart disease, diabetes, and high blood pressure? Blood pressure and heart disease  High blood pressure causes heart disease and increases the risk of stroke. This is more likely to develop in people who have high blood pressure readings, are of African descent, or are overweight.  Talk with your health care provider about your target blood pressure readings.  Have your blood pressure checked: ? Every 3-5 years if you are 18-39   years of age. ? Every year if you are 40 years old or older.  If you are between the ages of 65 and 75 and are a current or former smoker, ask your health care provider if you should have a one-time screening for abdominal aortic aneurysm (AAA). Diabetes Have regular diabetes screenings. This checks your fasting blood sugar level. Have the screening done:  Once every three years after age 45 if you are at a normal weight and have  a low risk for diabetes.  More often and at a younger age if you are overweight or have a high risk for diabetes. What should I know about preventing infection? Hepatitis B If you have a higher risk for hepatitis B, you should be screened for this virus. Talk with your health care provider to find out if you are at risk for hepatitis B infection. Hepatitis C Blood testing is recommended for:  Everyone born from 1945 through 1965.  Anyone with known risk factors for hepatitis C. Sexually transmitted infections (STIs)  You should be screened each year for STIs, including gonorrhea and chlamydia, if: ? You are sexually active and are younger than 48 years of age. ? You are older than 48 years of age and your health care provider tells you that you are at risk for this type of infection. ? Your sexual activity has changed since you were last screened, and you are at increased risk for chlamydia or gonorrhea. Ask your health care provider if you are at risk.  Ask your health care provider about whether you are at high risk for HIV. Your health care provider may recommend a prescription medicine to help prevent HIV infection. If you choose to take medicine to prevent HIV, you should first get tested for HIV. You should then be tested every 3 months for as long as you are taking the medicine. Follow these instructions at home: Lifestyle  Do not use any products that contain nicotine or tobacco, such as cigarettes, e-cigarettes, and chewing tobacco. If you need help quitting, ask your health care provider.  Do not use street drugs.  Do not share needles.  Ask your health care provider for help if you need support or information about quitting drugs. Alcohol use  Do not drink alcohol if your health care provider tells you not to drink.  If you drink alcohol: ? Limit how much you have to 0-2 drinks a day. ? Be aware of how much alcohol is in your drink. In the [place] one drink equals one 12  oz bottle of beer (355 mL), one 5 oz glass of wine (148 mL), or one 1 oz glass of hard liquor (44 mL). General instructions  Schedule regular health, dental, and eye exams.  Stay current with your vaccines.  Tell your health care provider if: ? You often feel depressed. ? You have ever been abused or do not feel safe at home. Summary  Adopting a healthy lifestyle and getting preventive care are important in promoting health and wellness.  Follow your health care provider's instructions about healthy diet, exercising, and getting tested or screened for diseases.  Follow your health care provider's instructions on monitoring your cholesterol and blood pressure. This information is not intended to replace advice given to you by your health care provider. Make sure you discuss any questions you have with your health care provider. Document Revised: 01/03/2018 Document Reviewed: 01/03/2018 Elsevier Patient Education  2020 [employer]  

## 2019-06-25 NOTE — Progress Notes (Signed)
Billy Bray 49 y.o.   Chief Complaint  Patient presents with  . Hypertension  . R shoulder pain    sees ortho, had cortizone shot 1 m   . Asthma    inhaler refill     HISTORY OF PRESENT ILLNESS: This is a 49 y.o. male with history of hypertension, asthma, chronic pain syndrome, ADD here for follow-up and medication refill. 1.  Hypertension: On amlodipine 5 mg daily. BP Readings from Last 3 Encounters:  06/25/19 (!) 135/93  12/25/18 (!) 134/98  10/29/18 (!) 152/102   2.  Asthma: Well-controlled.  Uses albuterol inhaler infrequently. 3.  History of ADD: On Adderall 4.  Chronic pain syndrome: On gabapentin.  Needs medication refill. No other complaints or medical concerns today.  HPI   Prior to Admission medications   Medication Sig Start Date End Date Taking? Authorizing Provider  albuterol (PROVENTIL) (2.5 MG/3ML) 0.083% nebulizer solution Take 3 mLs (2.5 mg total) by nebulization every 6 (six) hours as needed for wheezing or shortness of breath. 04/17/15  Yes Veryl Speak, FNP  amLODipine (NORVASC) 5 MG tablet TAKE 1 TABLET BY MOUTH EVERY DAY 05/19/19  Yes Hamzeh Tall, Eilleen Kempf, MD  amphetamine-dextroamphetamine (ADDERALL XR) 30 MG 24 hr capsule Take 1 capsule (30 mg total) by mouth daily. 07/25/17  Yes Shambley, Audie Box, NP  amphetamine-dextroamphetamine (ADDERALL) 20 MG tablet Take 1 tablet (20 mg total) by mouth daily as needed. 07/25/17  Yes Evaristo Bury, NP  esomeprazole (NEXIUM) 40 MG capsule TAKE 1 CAPSULE BY MOUTH EVERY DAY 05/30/19  Yes Kline Bulthuis, Eilleen Kempf, MD  gabapentin (NEURONTIN) 300 MG capsule TAKE ONE CAPSULE EVERY MORNING AND EARLY AFTERNOON. TAKE 3 CAPSULES AT BEDTIME 06/04/19  Yes Jazyah Butsch, Eilleen Kempf, MD  ketoconazole (NIZORAL) 2 % cream APPLY TO AFFECTED AREA EVERY DAY 04/19/19  Yes Camiya Vinal, Eilleen Kempf, MD  triamcinolone cream (KENALOG) 0.1 % APPLY TO AFFECTED AREA TWICE A DAY 04/01/19  Yes Ifeoma Vallin, Eilleen Kempf, MD  albuterol (PROVENTIL  HFA;VENTOLIN HFA) 108 (90 Base) MCG/ACT inhaler Inhale 1-2 puffs into the lungs every 6 (six) hours as needed for wheezing or shortness of breath. Patient not taking: Reported on 06/25/2019 04/17/15   Veryl Speak, FNP  Albuterol Sulfate (PROAIR RESPICLICK) 108 (90 Base) MCG/ACT AEPB Inhale 2 puffs into the lungs every 4 (four) hours as needed. Patient not taking: Reported on 06/25/2019 09/14/15   Veryl Speak, FNP    Allergies  Allergen Reactions  . Amoxicillin Hives    Patient Active Problem List   Diagnosis Date Noted  . Essential hypertension 06/27/2018  . Paresthesias 06/27/2018  . Chronic pain syndrome 08/31/2017  . Degeneration of intervertebral disc of cervical region 09/13/2016  . TMJ dysfunction 08/08/2016  . Cervical spondylolysis 08/05/2016  . Polyarthralgia 05/24/2016  . Asthma 09/14/2015  . Secondary male hypogonadism 06/17/2015  . ADD (attention deficit disorder) 09/02/2014  . Neuropathy 09/02/2014    Past Medical History:  Diagnosis Date  . Allergy   . Arthritis   . Asthma   . Depression   . Kidney stones     Past Surgical History:  Procedure Laterality Date  . BILATERAL CARPAL TUNNEL RELEASE  2004    Social History   Socioeconomic History  . Marital status: Divorced    Spouse name: Not on file  . Number of children: 2  . Years of education: 57  . Highest education level: Not on file  Occupational History  . Occupation: Personnel officer  Tobacco Use  .  Smoking status: Former Games developer  . Smokeless tobacco: Never Used  Substance and Sexual Activity  . Alcohol use: No    Alcohol/week: 0.0 standard drinks  . Drug use: Yes    Types: Marijuana  . Sexual activity: Not Currently  Other Topics Concern  . Not on file  Social History Narrative   Fun: Entertainment sounds/lights for concerts,   Denies religious beliefs effecting health care.    Social Determinants of Health   Financial Resource Strain:   . Difficulty of Paying Living Expenses:     Food Insecurity:   . Worried About Programme researcher, broadcasting/film/video in the Last Year:   . Barista in the Last Year:   Transportation Needs:   . Freight forwarder (Medical):   Marland Kitchen Lack of Transportation (Non-Medical):   Physical Activity:   . Days of Exercise per Week:   . Minutes of Exercise per Session:   Stress:   . Feeling of Stress :   Social Connections:   . Frequency of Communication with Friends and Family:   . Frequency of Social Gatherings with Friends and Family:   . Attends Religious Services:   . Active Member of Clubs or Organizations:   . Attends Banker Meetings:   Marland Kitchen Marital Status:   Intimate Partner Violence:   . Fear of Current or Ex-Partner:   . Emotionally Abused:   Marland Kitchen Physically Abused:   . Sexually Abused:     Family History  Problem Relation Age of Onset  . Healthy Mother   . Healthy Father   . Stroke Maternal Grandmother   . Diabetes Maternal Grandmother   . Stroke Maternal Grandfather   . Diabetes Maternal Grandfather   . Colon cancer Paternal Grandfather      Review of Systems  Constitutional: Negative.  Negative for chills and fever.  HENT: Negative.  Negative for congestion and sore throat.   Respiratory: Negative.  Negative for cough and shortness of breath.   Cardiovascular: Negative.  Negative for chest pain and palpitations.  Gastrointestinal: Negative.  Negative for abdominal pain, blood in stool, diarrhea, melena, nausea and vomiting.  Genitourinary: Negative.  Negative for dysuria and hematuria.  Musculoskeletal: Positive for joint pain (Chronic left shoulder pain, under orthopedic surveillance).  Skin: Negative.   Neurological: Negative for dizziness and headaches.  All other systems reviewed and are negative.  Today's Vitals   06/25/19 1509  BP: (!) 135/93  Pulse: 97  Temp: 97.8 F (36.6 C)  SpO2: 96%  Weight: 239 lb (108.4 kg)  Height: 5\' 11"  (1.803 m)   Body mass index is 33.33 kg/m.   Physical  Exam Vitals reviewed.  Constitutional:      Appearance: Normal appearance.  HENT:     Head: Normocephalic.  Eyes:     Extraocular Movements: Extraocular movements intact.     Conjunctiva/sclera: Conjunctivae normal.     Pupils: Pupils are equal, round, and reactive to light.  Cardiovascular:     Rate and Rhythm: Normal rate and regular rhythm.     Pulses: Normal pulses.     Heart sounds: Normal heart sounds.  Pulmonary:     Effort: Pulmonary effort is normal.     Breath sounds: Normal breath sounds.  Abdominal:     Palpations: Abdomen is soft.     Tenderness: There is no abdominal tenderness.  Musculoskeletal:        General: Normal range of motion.     Cervical back: Normal range  of motion and neck supple.  Skin:    General: Skin is warm and dry.     Capillary Refill: Capillary refill takes less than 2 seconds.  Neurological:     General: No focal deficit present.     Mental Status: He is alert and oriented to person, place, and time.  Psychiatric:        Mood and Affect: Mood normal.        Behavior: Behavior normal.    A total of 30 minutes was spent with the patient, greater than 50% of which was in counseling/coordination of care regarding chronic medical problems, treatment and management, review of all medications, review of most recent office visit notes, review of most recent blood work results, diet and nutrition, prognosis and need for follow-up.   ASSESSMENT & PLAN: Clinically stable.  No medical concerns identified during this visit.  Continue present medications.  No changes.  Follow-up in 6 months. Billy Bray was seen today for hypertension, r shoulder pain and asthma.  Diagnoses and all orders for this visit:  Essential hypertension  Chronic pain syndrome -     gabapentin (NEURONTIN) 300 MG capsule; Take one capsule in am, early afternoon, and three capsules at bedtime.  Screening for lipoid disorders  Mild intermittent asthma without complication -      Albuterol Sulfate (PROAIR RESPICLICK) 108 (90 Base) MCG/ACT AEPB; Inhale 2 puffs into the lungs every 4 (four) hours as needed.    Patient Instructions       If you have lab work done today you will be contacted with your lab results within the next 2 weeks.  If you have not heard from [place] then please contact us. The fastest way to get your results is to register for My Chart.   IF you received an x-ray today, you will receive an invoice from [facility]. Please contact [facility] at [phone] with questions or concerns regarding your invoice.   IF you received labwork today, you will receive an invoice from [place]. Please contact [facility] at [phone] with questions or concerns regarding your invoice.   Our billing staff will not be able to assist you with questions regarding bills from these companies.  You will be contacted with the lab results as soon as they are available. The fastest way to get your results is to activate your My Chart account. Instructions are located on the last page of this paperwork. If you have not heard from [place] regarding the results in 2 weeks, please contact this office.      Health Maintenance, Male Adopting a healthy lifestyle and getting preventive care are important in promoting health and wellness. Ask your health care provider about:  The right schedule for you to have regular tests and exams.  Things you can do on your own to prevent diseases and keep yourself healthy. What should I know about diet, weight, and exercise? Eat a healthy diet   Eat a diet that includes plenty of vegetables, fruits, low-fat dairy products, and lean protein.  Do not eat a lot of foods that are high in solid fats, added sugars, or sodium. Maintain a healthy weight Body mass index (BMI) is a measurement that can be used to identify possible weight problems. It estimates body fat based on height and weight. Your health care provider can help  determine your BMI and help you achieve or maintain a healthy weight. Get regular exercise Get regular exercise. This is one of the most important  things you can do for your health. Most adults should:  Exercise for at least 150 minutes each week. The exercise should increase your heart rate and make you sweat (moderate-intensity exercise).  Do strengthening exercises at least twice a week. This is in addition to the moderate-intensity exercise.  Spend less time sitting. Even light physical activity can be beneficial. Watch cholesterol and blood lipids Have your blood tested for lipids and cholesterol at 48 years of age, then have this test every 5 years. You may need to have your cholesterol levels checked more often if:  Your lipid or cholesterol levels are high.  You are older than 48 years of age.  You are at high risk for heart disease. What should I know about cancer screening? Many types of cancers can be detected early and may often be prevented. Depending on your health history and family history, you may need to have cancer screening at various ages. This may include screening for:  Colorectal cancer.  Prostate cancer.  Skin cancer.  Lung cancer. What should I know about heart disease, diabetes, and high blood pressure? Blood pressure and heart disease  High blood pressure causes heart disease and increases the risk of stroke. This is more likely to develop in people who have high blood pressure readings, are of African descent, or are overweight.  Talk with your health care provider about your target blood pressure readings.  Have your blood pressure checked: ? Every 3-5 years if you are 73-49 years of age. ? Every year if you are 62 years old or older.  If you are between the ages of 24 and 72 and are a current or former smoker, ask your health care provider if you should have a one-time screening for abdominal aortic aneurysm (AAA). Diabetes Have regular diabetes  screenings. This checks your fasting blood sugar level. Have the screening done:  Once every three years after age 45 if you are at a normal weight and have a low risk for diabetes.  More often and at a younger age if you are overweight or have a high risk for diabetes. What should I know about preventing infection? Hepatitis B If you have a higher risk for hepatitis B, you should be screened for this virus. Talk with your health care provider to find out if you are at risk for hepatitis B infection. Hepatitis C Blood testing is recommended for:  Everyone born from [birth year] through 1964.  Anyone with known risk factors for hepatitis C. Sexually transmitted infections (STIs)  You should be screened each year for STIs, including gonorrhea and chlamydia, if: ? You are sexually active and are younger than 47 years of age. ? You are older than 48 years of age and your health care provider tells you that you are at risk for this type of infection. ? Your sexual activity has changed since you were last screened, and you are at increased risk for chlamydia or gonorrhea. Ask your health care provider if you are at risk.  Ask your health care provider about whether you are at high risk for HIV. Your health care provider may recommend a prescription medicine to help prevent HIV infection. If you choose to take medicine to prevent HIV, you should first get tested for HIV. You should then be tested every 3 months for as long as you are taking the medicine. Follow these instructions at home: Lifestyle  Do not use any products that contain nicotine or tobacco, such as cigarettes,  e-cigarettes, and chewing tobacco. If you need help quitting, ask your health care provider.  Do not use street drugs.  Do not share needles.  Ask your health care provider for help if you need support or information about quitting drugs. Alcohol use  Do not drink alcohol if your health care provider tells you not to  drink.  If you drink alcohol: ? Limit how much you have to 0-2 drinks a day. ? Be aware of how much alcohol is in your drink. In the [place] one drink equals one 12 oz bottle of beer (355 mL), one 5 oz glass of wine (148 mL), or one 1 oz glass of hard liquor (44 mL). General instructions  Schedule regular health, dental, and eye exams.  Stay current with your vaccines.  Tell your health care provider if: ? You often feel depressed. ? You have ever been abused or do not feel safe at home. Summary  Adopting a healthy lifestyle and getting preventive care are important in promoting health and wellness.  Follow your health care provider's instructions about healthy diet, exercising, and getting tested or screened for diseases.  Follow your health care provider's instructions on monitoring your cholesterol and blood pressure. This information is not intended to replace advice given to you by your health care provider. Make sure you discuss any questions you have with your health care provider. Document Revised: 01/03/2018 Document Reviewed: 01/03/2018 Elsevier Patient Education  2020 Elsevier Inc.      Edwina BarthMiguel Choua Chalker, MD [facility]

## 2019-07-05 ENCOUNTER — Encounter: Payer: Self-pay | Admitting: Emergency Medicine

## 2019-07-05 ENCOUNTER — Other Ambulatory Visit: Payer: Self-pay

## 2019-07-05 NOTE — Telephone Encounter (Signed)
Spoke w/ patient states Respiclick is not covered under insurance and would like the aerosol inhaler previously ordered by another provider in system . Patient sates has coverage for now, previous inhaler pended.

## 2019-07-06 MED ORDER — ALBUTEROL SULFATE HFA 108 (90 BASE) MCG/ACT IN AERS: 1.0000 | INHALATION_SPRAY | Freq: Four times a day (QID) | RESPIRATORY_TRACT | 1 refills | Status: AC | PRN

## 2019-07-23 DIAGNOSIS — Z79899 Other long term (current) drug therapy: Secondary | ICD-10-CM | POA: Diagnosis not present

## 2019-07-23 DIAGNOSIS — F902 Attention-deficit hyperactivity disorder, combined type: Secondary | ICD-10-CM | POA: Diagnosis not present

## 2019-07-23 DIAGNOSIS — F338 Other recurrent depressive disorders: Secondary | ICD-10-CM | POA: Diagnosis not present

## 2019-07-23 DIAGNOSIS — F419 Anxiety disorder, unspecified: Secondary | ICD-10-CM | POA: Diagnosis not present

## 2019-07-29 ENCOUNTER — Other Ambulatory Visit: Payer: Self-pay | Admitting: Emergency Medicine

## 2019-07-29 DIAGNOSIS — R21 Rash and other nonspecific skin eruption: Secondary | ICD-10-CM

## 2019-08-20 ENCOUNTER — Other Ambulatory Visit: Payer: Self-pay | Admitting: Emergency Medicine

## 2019-08-20 DIAGNOSIS — I10 Essential (primary) hypertension: Secondary | ICD-10-CM

## 2019-08-20 NOTE — Telephone Encounter (Signed)
Requested Prescriptions  Pending Prescriptions Disp Refills  . amLODipine (NORVASC) 5 MG tablet [Pharmacy Med Name: AMLODIPINE BESYLATE 5 MG TAB] 90 tablet 1    Sig: TAKE 1 TABLET BY MOUTH EVERY DAY     Cardiovascular:  Calcium Channel Blockers Failed - 08/20/2019 10:08 PM      Failed - Last BP in normal range    BP Readings from Last 1 Encounters:  06/25/19 (!) 135/93         Passed - Valid encounter within last 6 months    Recent Outpatient Visits          1 month ago Essential hypertension   [facility] at [place], Eilleen Kempf, MD   7 months ago Essential hypertension   Primary Care at [facility], [place], MD   10 months ago Continuous RUQ abdominal pain   Primary Care at [facility], Sandria Bales, MD   1 year ago Encounter for general adult medical examination with abnormal findings   Primary Care at [facility], Eilleen Kempf, MD   1 year ago Acute costochondritis   Primary Care at [facility], Eilleen Kempf, MD      Future Appointments            In 4 months Sagardia, Eilleen Kempf, MD Primary Care at [place], [facility]

## 2019-08-26 ENCOUNTER — Other Ambulatory Visit: Payer: Self-pay | Admitting: Emergency Medicine

## 2019-09-14 ENCOUNTER — Other Ambulatory Visit: Payer: Self-pay | Admitting: Emergency Medicine

## 2019-09-14 DIAGNOSIS — R12 Heartburn: Secondary | ICD-10-CM

## 2019-10-23 DIAGNOSIS — Z79899 Other long term (current) drug therapy: Secondary | ICD-10-CM | POA: Diagnosis not present

## 2019-10-23 DIAGNOSIS — F902 Attention-deficit hyperactivity disorder, combined type: Secondary | ICD-10-CM | POA: Diagnosis not present

## 2019-10-23 DIAGNOSIS — F338 Other recurrent depressive disorders: Secondary | ICD-10-CM | POA: Diagnosis not present

## 2019-10-23 DIAGNOSIS — F419 Anxiety disorder, unspecified: Secondary | ICD-10-CM | POA: Diagnosis not present

## 2019-11-10 ENCOUNTER — Other Ambulatory Visit: Payer: Self-pay | Admitting: Emergency Medicine

## 2019-11-10 DIAGNOSIS — R12 Heartburn: Secondary | ICD-10-CM

## 2019-11-10 NOTE — Telephone Encounter (Signed)
Requested Prescriptions  Pending Prescriptions Disp Refills  . esomeprazole (NEXIUM) 40 MG capsule [Pharmacy Med Name: ESOMEPRAZOLE MAG DR 40 MG CAP] 90 capsule 1    Sig: TAKE 1 CAPSULE BY MOUTH EVERY DAY     Gastroenterology: Proton Pump Inhibitors Passed - 11/10/2019 10:44 AM      Passed - Valid encounter within last 12 months    Recent Outpatient Visits          4 months ago Essential hypertension   Primary Care at [facility], Eilleen Kempf, MD   10 months ago Essential hypertension   Primary Care at [place], Eilleen Kempf, MD   1 year ago Continuous RUQ abdominal pain   Primary Care at [facility], Sandria Bales, MD   1 year ago Encounter for general adult medical examination with abnormal findings   Primary Care at [facility], Eilleen Kempf, MD   1 year ago Acute costochondritis   Primary Care at [facility], Eilleen Kempf, MD      Future Appointments            In 1 month Sagardia, Eilleen Kempf, MD Primary Care at [place], [facility]

## 2019-12-18 ENCOUNTER — Other Ambulatory Visit: Payer: Self-pay | Admitting: Emergency Medicine

## 2019-12-18 DIAGNOSIS — G894 Chronic pain syndrome: Secondary | ICD-10-CM

## 2019-12-25 ENCOUNTER — Encounter: Payer: Self-pay | Admitting: Emergency Medicine

## 2019-12-25 ENCOUNTER — Ambulatory Visit: Payer: BC Managed Care – PPO | Admitting: Emergency Medicine

## 2019-12-25 ENCOUNTER — Other Ambulatory Visit: Payer: Self-pay

## 2019-12-25 VITALS — BP 133/86 | HR 109 | Temp 98.7°F | Resp 16 | Ht 71.0 in | Wt 237.0 lb

## 2019-12-25 DIAGNOSIS — I10 Essential (primary) hypertension: Secondary | ICD-10-CM | POA: Diagnosis not present

## 2019-12-25 DIAGNOSIS — Z13220 Encounter for screening for lipoid disorders: Secondary | ICD-10-CM | POA: Diagnosis not present

## 2019-12-25 DIAGNOSIS — R202 Paresthesia of skin: Secondary | ICD-10-CM

## 2019-12-25 DIAGNOSIS — G894 Chronic pain syndrome: Secondary | ICD-10-CM | POA: Diagnosis not present

## 2019-12-25 DIAGNOSIS — Z130 Encounter for screening for diseases of the blood and blood-forming organs and certain disorders involving the immune mechanism: Secondary | ICD-10-CM

## 2019-12-25 MED ORDER — GABAPENTIN 300 MG PO CAPS: ORAL_CAPSULE | ORAL | 1 refills | Status: DC

## 2019-12-25 NOTE — Patient Instructions (Addendum)
   If you have lab work done today you will be contacted with your lab results within the next 2 weeks.  If you have not heard from us then please contact us. The fastest way to get your results is to register for My Chart.   IF you received an x-ray today, you will receive an invoice from [facility]. Please contact [facility] at [phone] with questions or concerns regarding your invoice.   IF you received labwork today, you will receive an invoice from [facility]. Please contact [facility] at [phone] with questions or concerns regarding your invoice.   Our billing staff will not be able to assist you with questions regarding bills from these companies.  You will be contacted with the lab results as soon as they are available. The fastest way to get your results is to activate your My Chart account. Instructions are located on the last page of this paperwork. If you have not heard from us regarding the results in 2 weeks, please contact this office.      Health Maintenance, Male Adopting a healthy lifestyle and getting preventive care are important in promoting health and wellness. Ask your health care provider about:  The right schedule for you to have regular tests and exams.  Things you can do on your own to prevent diseases and keep yourself healthy. What should I know about diet, weight, and exercise? Eat a healthy diet   Eat a diet that includes plenty of vegetables, fruits, low-fat dairy products, and lean protein.  Do not eat a lot of foods that are high in solid fats, added sugars, or sodium. Maintain a healthy weight Body mass index (BMI) is a measurement that can be used to identify possible weight problems. It estimates body fat based on height and weight. Your health care provider can help determine your BMI and help you achieve or maintain a healthy weight. Get regular exercise Get regular exercise. This is one of the most important things you  can do for your health. Most adults should:  Exercise for at least 150 minutes each week. The exercise should increase your heart rate and make you sweat (moderate-intensity exercise).  Do strengthening exercises at least twice a week. This is in addition to the moderate-intensity exercise.  Spend less time sitting. Even light physical activity can be beneficial. Watch cholesterol and blood lipids Have your blood tested for lipids and cholesterol at 48 years of age, then have this test every 5 years. You may need to have your cholesterol levels checked more often if:  Your lipid or cholesterol levels are high.  You are older than 48 years of age.  You are at high risk for heart disease. What should I know about cancer screening? Many types of cancers can be detected early and may often be prevented. Depending on your health history and family history, you may need to have cancer screening at various ages. This may include screening for:  Colorectal cancer.  Prostate cancer.  Skin cancer.  Lung cancer. What should I know about heart disease, diabetes, and high blood pressure? Blood pressure and heart disease  High blood pressure causes heart disease and increases the risk of stroke. This is more likely to develop in people who have high blood pressure readings, are of African descent, or are overweight.  Talk with your health care provider about your target blood pressure readings.  Have your blood pressure checked: ? Every 3-5 years if you are 18-39   years of age. ? Every year if you are 40 years old or older.  If you are between the ages of 65 and 75 and are a current or former smoker, ask your health care provider if you should have a one-time screening for abdominal aortic aneurysm (AAA). Diabetes Have regular diabetes screenings. This checks your fasting blood sugar level. Have the screening done:  Once every three years after age 45 if you are at a normal weight and have  a low risk for diabetes.  More often and at a younger age if you are overweight or have a high risk for diabetes. What should I know about preventing infection? Hepatitis B If you have a higher risk for hepatitis B, you should be screened for this virus. Talk with your health care provider to find out if you are at risk for hepatitis B infection. Hepatitis C Blood testing is recommended for:  Everyone born from 1945 through 1965.  Anyone with known risk factors for hepatitis C. Sexually transmitted infections (STIs)  You should be screened each year for STIs, including gonorrhea and chlamydia, if: ? You are sexually active and are younger than 48 years of age. ? You are older than 48 years of age and your health care provider tells you that you are at risk for this type of infection. ? Your sexual activity has changed since you were last screened, and you are at increased risk for chlamydia or gonorrhea. Ask your health care provider if you are at risk.  Ask your health care provider about whether you are at high risk for HIV. Your health care provider may recommend a prescription medicine to help prevent HIV infection. If you choose to take medicine to prevent HIV, you should first get tested for HIV. You should then be tested every 3 months for as long as you are taking the medicine. Follow these instructions at home: Lifestyle  Do not use any products that contain nicotine or tobacco, such as cigarettes, e-cigarettes, and chewing tobacco. If you need help quitting, ask your health care provider.  Do not use street drugs.  Do not share needles.  Ask your health care provider for help if you need support or information about quitting drugs. Alcohol use  Do not drink alcohol if your health care provider tells you not to drink.  If you drink alcohol: ? Limit how much you have to 0-2 drinks a day. ? Be aware of how much alcohol is in your drink. In the [place] one drink equals one 12  oz bottle of beer (355 mL), one 5 oz glass of wine (148 mL), or one 1 oz glass of hard liquor (44 mL). General instructions  Schedule regular health, dental, and eye exams.  Stay current with your vaccines.  Tell your health care provider if: ? You often feel depressed. ? You have ever been abused or do not feel safe at home. Summary  Adopting a healthy lifestyle and getting preventive care are important in promoting health and wellness.  Follow your health care provider's instructions about healthy diet, exercising, and getting tested or screened for diseases.  Follow your health care provider's instructions on monitoring your cholesterol and blood pressure. This information is not intended to replace advice given to you by your health care provider. Make sure you discuss any questions you have with your health care provider. Document Revised: 01/03/2018 Document Reviewed: 01/03/2018 Elsevier Patient Education  2020 Elsevier Inc.  

## 2019-12-25 NOTE — Progress Notes (Signed)
Billy Bray 49 y.o.   Chief Complaint  Patient presents with  . Hypertension    follow up 6 month  . Tingling    per patient both fingers randomly for few moments 2-3 weeks ago    HISTORY OF PRESENT ILLNESS: This is a 49 y.o. male with history of hypertension here for follow-up. Also complaining of intermittent tingling to fingers of both hands for the past several weeks but better today. No other complaints or medical concerns today.  HPI   Prior to Admission medications   Medication Sig Start Date End Date Taking? Authorizing Provider  albuterol (PROVENTIL) (2.5 MG/3ML) 0.083% nebulizer solution Take 3 mLs (2.5 mg total) by nebulization every 6 (six) hours as needed for wheezing or shortness of breath. 04/17/15  Yes Veryl Speak, FNP  albuterol (VENTOLIN HFA) 108 (90 Base) MCG/ACT inhaler Inhale 1-2 puffs into the lungs every 6 (six) hours as needed for wheezing or shortness of breath. 07/06/19  Yes Shashank Kwasnik, Eilleen Kempf, MD  amLODipine (NORVASC) 5 MG tablet TAKE 1 TABLET BY MOUTH EVERY DAY 08/20/19  Yes Jadda Hunsucker, Eilleen Kempf, MD  amphetamine-dextroamphetamine (ADDERALL XR) 30 MG 24 hr capsule Take 1 capsule (30 mg total) by mouth daily. 07/25/17  Yes Evaristo Bury, NP  esomeprazole (NEXIUM) 40 MG capsule TAKE 1 CAPSULE BY MOUTH EVERY DAY 11/10/19  Yes Jonavin Seder, Eilleen Kempf, MD  gabapentin (NEURONTIN) 300 MG capsule Take one capsule in am, early afternoon, and three capsules at bedtime. 06/25/19  Yes Maleena Eddleman, Eilleen Kempf, MD  ketoconazole (NIZORAL) 2 % cream APPLY TO AFFECTED AREA EVERY DAY 08/26/19  Yes Darcell Yacoub, Eilleen Kempf, MD  triamcinolone cream (KENALOG) 0.1 % APPLY TO AFFECTED AREA TWICE A DAY 07/30/19  Yes Leialoha Hanna, Eilleen Kempf, MD  amphetamine-dextroamphetamine (ADDERALL) 20 MG tablet Take 1 tablet (20 mg total) by mouth daily as needed. Patient not taking: Reported on 12/25/2019 07/25/17   Evaristo Bury, NP    Allergies  Allergen Reactions  . Amoxicillin Hives      Patient Active Problem List   Diagnosis Date Noted  . Essential hypertension 06/27/2018  . Paresthesias 06/27/2018  . Chronic pain syndrome 08/31/2017  . Degeneration of intervertebral disc of cervical region 09/13/2016  . TMJ dysfunction 08/08/2016  . Cervical spondylolysis 08/05/2016  . Polyarthralgia 05/24/2016  . Asthma 09/14/2015  . Secondary male hypogonadism 06/17/2015  . ADD (attention deficit disorder) 09/02/2014  . Neuropathy 09/02/2014    Past Medical History:  Diagnosis Date  . Allergy   . Arthritis   . Asthma   . Depression   . Kidney stones     Past Surgical History:  Procedure Laterality Date  . BILATERAL CARPAL TUNNEL RELEASE  2004    Social History   Socioeconomic History  . Marital status: Divorced    Spouse name: Not on file  . Number of children: 2  . Years of education: 69  . Highest education level: Not on file  Occupational History  . Occupation: Personnel officer  Tobacco Use  . Smoking status: Former Games developer  . Smokeless tobacco: Never Used  Substance and Sexual Activity  . Alcohol use: No    Alcohol/week: 0.0 standard drinks  . Drug use: Yes    Types: Marijuana  . Sexual activity: Not Currently  Other Topics Concern  . Not on file  Social History Narrative   Fun: Entertainment sounds/lights for concerts,   Denies religious beliefs effecting health care.    Social Determinants of Health   Financial  Resource Strain:   . Difficulty of Paying Living Expenses: Not on file  Food Insecurity:   . Worried About Programme researcher, broadcasting/film/video in the Last Year: Not on file  . Ran Out of Food in the Last Year: Not on file  Transportation Needs:   . Lack of Transportation (Medical): Not on file  . Lack of Transportation (Non-Medical): Not on file  Physical Activity:   . Days of Exercise per Week: Not on file  . Minutes of Exercise per Session: Not on file  Stress:   . Feeling of Stress : Not on file  Social Connections:   . Frequency of  Communication with Friends and Family: Not on file  . Frequency of Social Gatherings with Friends and Family: Not on file  . Attends Religious Services: Not on file  . Active Member of Clubs or Organizations: Not on file  . Attends Banker Meetings: Not on file  . Marital Status: Not on file  Intimate Partner Violence:   . Fear of Current or Ex-Partner: Not on file  . Emotionally Abused: Not on file  . Physically Abused: Not on file  . Sexually Abused: Not on file    Family History  Problem Relation Age of Onset  . Healthy Mother   . Healthy Father   . Stroke Maternal Grandmother   . Diabetes Maternal Grandmother   . Stroke Maternal Grandfather   . Diabetes Maternal Grandfather   . Colon cancer Paternal Grandfather      Review of Systems  Constitutional: Negative.  Negative for chills and fever.  HENT: Negative.  Negative for congestion and sore throat.   Respiratory: Negative.  Negative for cough and shortness of breath.   Cardiovascular: Negative.  Negative for chest pain and palpitations.  Gastrointestinal: Negative.  Negative for abdominal pain, diarrhea, nausea and vomiting.  Genitourinary: Negative.  Negative for dysuria and hematuria.  Musculoskeletal: Negative.  Negative for back pain, myalgias and neck pain.  Skin: Negative.  Negative for rash.  Neurological: Positive for tingling (Hands). Negative for dizziness, speech change and headaches.  All other systems reviewed and are negative.   Today's Vitals   12/25/19 1415  BP: 133/86  Pulse: (!) 109  Resp: 16  Temp: 98.7 F (37.1 C)  SpO2: 96%  Weight: 237 lb (107.5 kg)  Height: 5\' 11"  (1.803 m)   Body mass index is 33.05 kg/m. Wt Readings from Last 3 Encounters:  12/25/19 237 lb (107.5 kg)  06/25/19 239 lb (108.4 kg)  05/08/19 235 lb (106.6 kg)    Physical Exam Vitals reviewed.  Constitutional:      Appearance: Normal appearance.  HENT:     Head: Normocephalic.  Eyes:     Extraocular  Movements: Extraocular movements intact.     Conjunctiva/sclera: Conjunctivae normal.     Pupils: Pupils are equal, round, and reactive to light.  Cardiovascular:     Rate and Rhythm: Normal rate and regular rhythm.     Pulses: Normal pulses.     Heart sounds: Normal heart sounds.  Pulmonary:     Effort: Pulmonary effort is normal.     Breath sounds: Normal breath sounds.  Musculoskeletal:        General: Normal range of motion.     Cervical back: Normal range of motion and neck supple.  Skin:    General: Skin is warm and dry.     Capillary Refill: Capillary refill takes less than 2 seconds.  Neurological:  General: No focal deficit present.     Mental Status: He is alert and oriented to person, place, and time.  Psychiatric:        Mood and Affect: Mood normal.        Behavior: Behavior normal.      ASSESSMENT & PLAN: Clinically stable.  No medical concerns identified during this visit.  Continue present medications.  No changes.  Follow-up in 6 months.  Tawanna Coolerodd was seen today for hypertension and tingling.  Diagnoses and all orders for this visit:  Essential hypertension -     Comprehensive metabolic panel  Chronic pain syndrome -     gabapentin (NEURONTIN) 300 MG capsule; Take one capsule in am, early afternoon, and three capsules at bedtime.  Paresthesia -     Comprehensive metabolic panel -     CBC with Differential/Platelet -     Hemoglobin A1c  Screening for lipoid disorders -     Lipid panel  Screening for deficiency anemia -     CBC with Differential/Platelet    Patient Instructions       If you have lab work done today you will be contacted with your lab results within the next 2 weeks.  If you have not heard from us then please contact us. The fastest way to get your results is to register for My Chart.   IF you received an x-ray today, you will [facility]. Please contact [facility] at [phone]  with questions or concerns regarding your invoice.   IF you received labwork today, you will receive an invoice from [facility]. Please contact [facility] at [phone] with questions or concerns regarding your invoice.   Our billing staff will not be able to assist you with questions regarding bills from these companies.  You will be contacted with the lab results as soon as they are available. The fastest way to get your results is to activate your My Chart account. Instructions are located on the last page of this paperwork. If you have not heard from us regarding the results in 2 weeks, please contact this office.     Health Maintenance, Male Adopting a healthy lifestyle and getting preventive care are important in promoting health and wellness. Ask your health care provider about:  The right schedule for you to have regular tests and exams.  Things you can do on your own to prevent diseases and keep yourself healthy. What should I know about diet, weight, and exercise? Eat a healthy diet   Eat a diet that includes plenty of vegetables, fruits, low-fat dairy products, and lean protein.  Do not eat a lot of foods that are high in solid fats, added sugars, or sodium. Maintain a healthy weight Body mass index (BMI) is a measurement that can be used to identify possible weight problems. It estimates body fat based on height and weight. Your health care provider can help determine your BMI and help you achieve or maintain a healthy weight. Get regular exercise Get regular exercise. This is one of the most important things you can do for your health. Most adults should:  Exercise for at least 150 minutes each week. The exercise should increase your heart rate and make you sweat (moderate-intensity exercise).  Do strengthening exercises at least twice a week. This is in addition to the moderate-intensity exercise.  Spend less time sitting. Even light physical activity can be  beneficial. Watch cholesterol and blood lipids Have your blood tested  for lipids and cholesterol at 48 years of age, then have this test every 5 years. You may need to have your cholesterol levels checked more often if:  Your lipid or cholesterol levels are high.  You are older than 48 years of age.  You are at high risk for heart disease. What should I know about cancer screening? Many types of cancers can be detected early and may often be prevented. Depending on your health history and family history, you may need to have cancer screening at various ages. This may include screening for:  Colorectal cancer.  Prostate cancer.  Skin cancer.  Lung cancer. What should I know about heart disease, diabetes, and high blood pressure? Blood pressure and heart disease  High blood pressure causes heart disease and increases the risk of stroke. This is more likely to develop in people who have high blood pressure readings, are of African descent, or are overweight.  Talk with your health care provider about your target blood pressure readings.  Have your blood pressure checked: ? Every 3-5 years if you are 14-73 years of age. ? Every year if you are 82 years old or older.  If you are between the ages of 27 and 41 and are a current or former smoker, ask your health care provider if you should have a one-time screening for abdominal aortic aneurysm (AAA). Diabetes Have regular diabetes screenings. This checks your fasting blood sugar level. Have the screening done:  Once every three years after age 13 if you are at a normal weight and have a low risk for diabetes.  More often and at a younger age if you are overweight or have a high risk for diabetes. What should I know about preventing infection? Hepatitis B If you have a higher risk for hepatitis B, you should be screened for this virus. Talk with your health care provider to find out if you are at risk for hepatitis B  infection. Hepatitis C Blood testing is recommended for:  Everyone born from [birth year] through 1964.  Anyone with known risk factors for hepatitis C. Sexually transmitted infections (STIs)  You should be screened each year for STIs, including gonorrhea and chlamydia, if: ? You are sexually active and are younger than 48 years of age. ? You are older than 48 years of age and your health care provider tells you that you are at risk for this type of infection. ? Your sexual activity has changed since you were last screened, and you are at increased risk for chlamydia or gonorrhea. Ask your health care provider if you are at risk.  Ask your health care provider about whether you are at high risk for HIV. Your health care provider may recommend a prescription medicine to help prevent HIV infection. If you choose to take medicine to prevent HIV, you should first get tested for HIV. You should then be tested every 3 months for as long as you are taking the medicine. Follow these instructions at home: Lifestyle  Do not use any products that contain nicotine or tobacco, such as cigarettes, e-cigarettes, and chewing tobacco. If you need help quitting, ask your health care provider.  Do not use street drugs.  Do not share needles.  Ask your health care provider for help if you need support or information about quitting drugs. Alcohol use  Do not drink alcohol if your health care provider tells you not to drink.  If you drink alcohol: ? Limit how much you have  to 0-2 drinks a day. ? Be aware of how much alcohol is in your drink. In the [place] one drink equals one 12 oz bottle of beer (355 mL), one 5 oz glass of wine (148 mL), or one 1 oz glass of hard liquor (44 mL). General instructions  Schedule regular health, dental, and eye exams.  Stay current with your vaccines.  Tell your health care provider if: ? You often feel depressed. ? You have ever been abused or do not feel safe at  home. Summary  Adopting a healthy lifestyle and getting preventive care are important in promoting health and wellness.  Follow your health care provider's instructions about healthy diet, exercising, and getting tested or screened for diseases.  Follow your health care provider's instructions on monitoring your cholesterol and blood pressure. This information is not intended to replace advice given to you by your health care provider. Make sure you discuss any questions you have with your health care provider. Document Revised: 01/03/2018 Document Reviewed: 01/03/2018 Elsevier Patient Education  2020 Elsevier Inc.      Edwina Barth, MD [facility]

## 2019-12-26 LAB — CBC WITH DIFFERENTIAL/PLATELET
Basophils Absolute: 0 10*3/uL (ref 0.0–0.2)
Basos: 0 %
EOS (ABSOLUTE): 0.1 10*3/uL (ref 0.0–0.4)
Eos: 1 %
Hematocrit: 45.2 % (ref 37.5–51.0)
Hemoglobin: 15.7 g/dL (ref 13.0–17.7)
Immature Grans (Abs): 0 10*3/uL (ref 0.0–0.1)
Immature Granulocytes: 0 %
Lymphocytes Absolute: 1.3 10*3/uL (ref 0.7–3.1)
Lymphs: 17 %
MCH: 31.6 pg (ref 26.6–33.0)
MCHC: 34.7 g/dL (ref 31.5–35.7)
MCV: 91 fL (ref 79–97)
Monocytes Absolute: 0.5 10*3/uL (ref 0.1–0.9)
Monocytes: 7 %
Neutrophils Absolute: 5.7 10*3/uL (ref 1.4–7.0)
Neutrophils: 75 %
Platelets: 285 10*3/uL (ref 150–450)
RBC: 4.97 x10E6/uL (ref 4.14–5.80)
RDW: 13 % (ref 11.6–15.4)
WBC: 7.6 10*3/uL (ref 3.4–10.8)

## 2019-12-26 LAB — COMPREHENSIVE METABOLIC PANEL
ALT: 25 IU/L (ref 0–44)
AST: 16 IU/L (ref 0–40)
Albumin/Globulin Ratio: 2.1 (ref 1.2–2.2)
Albumin: 4.9 g/dL (ref 4.0–5.0)
Alkaline Phosphatase: 61 IU/L (ref 44–121)
BUN/Creatinine Ratio: 16 (ref 9–20)
BUN: 17 mg/dL (ref 6–24)
Bilirubin Total: 0.2 mg/dL (ref 0.0–1.2)
CO2: 20 mmol/L (ref 20–29)
Calcium: 9.2 mg/dL (ref 8.7–10.2)
Chloride: 105 mmol/L (ref 96–106)
Creatinine, Ser: 1.07 mg/dL (ref 0.76–1.27)
GFR calc Af Amer: 94 mL/min/{1.73_m2} (ref >59)
GFR calc non Af Amer: 82 mL/min/{1.73_m2} (ref >59)
Globulin, Total: 2.3 g/dL (ref 1.5–4.5)
Glucose: 134 mg/dL — ABNORMAL HIGH (ref 65–99)
Potassium: 4.6 mmol/L (ref 3.5–5.2)
Sodium: 142 mmol/L (ref 134–144)
Total Protein: 7.2 g/dL (ref 6.0–8.5)

## 2019-12-26 LAB — LIPID PANEL
Chol/HDL Ratio: 4.3 ratio (ref 0.0–5.0)
Cholesterol, Total: 192 mg/dL (ref 100–199)
HDL: 45 mg/dL (ref >39)
LDL Chol Calc (NIH): 94 mg/dL (ref 0–99)
Triglycerides: 318 mg/dL — ABNORMAL HIGH (ref 0–149)
VLDL Cholesterol Cal: 53 mg/dL — ABNORMAL HIGH (ref 5–40)

## 2019-12-26 LAB — HEMOGLOBIN A1C
Est. average glucose Bld gHb Est-mCnc: 114 mg/dL
Hgb A1c MFr Bld: 5.6 % (ref 4.8–5.6)

## 2020-01-20 ENCOUNTER — Other Ambulatory Visit: Payer: Self-pay | Admitting: Emergency Medicine

## 2020-01-20 DIAGNOSIS — R21 Rash and other nonspecific skin eruption: Secondary | ICD-10-CM

## 2020-02-06 ENCOUNTER — Telehealth (INDEPENDENT_AMBULATORY_CARE_PROVIDER_SITE_OTHER): Payer: BC Managed Care – PPO | Admitting: Emergency Medicine

## 2020-02-06 ENCOUNTER — Other Ambulatory Visit: Payer: Self-pay

## 2020-02-06 ENCOUNTER — Encounter: Payer: Self-pay | Admitting: Emergency Medicine

## 2020-02-06 DIAGNOSIS — J0100 Acute maxillary sinusitis, unspecified: Secondary | ICD-10-CM

## 2020-02-06 DIAGNOSIS — R0981 Nasal congestion: Secondary | ICD-10-CM | POA: Diagnosis not present

## 2020-02-06 DIAGNOSIS — R21 Rash and other nonspecific skin eruption: Secondary | ICD-10-CM

## 2020-02-06 DIAGNOSIS — R519 Headache, unspecified: Secondary | ICD-10-CM | POA: Diagnosis not present

## 2020-02-06 MED ORDER — AZITHROMYCIN 250 MG PO TABS: ORAL_TABLET | ORAL | 0 refills | Status: DC

## 2020-02-06 MED ORDER — PSEUDOEPHEDRINE-GUAIFENESIN ER 60-600 MG PO TB12: 1.0000 | ORAL_TABLET | Freq: Two times a day (BID) | ORAL | 1 refills | Status: AC

## 2020-02-06 MED ORDER — TRAMADOL HCL 50 MG PO TABS: 50.0000 mg | ORAL_TABLET | Freq: Three times a day (TID) | ORAL | 0 refills | Status: AC | PRN

## 2020-02-06 MED ORDER — TRIAMCINOLONE ACETONIDE 0.1 % EX CREA: TOPICAL_CREAM | CUTANEOUS | 1 refills | Status: DC

## 2020-02-06 NOTE — Patient Instructions (Signed)
° ° ° °  If you have lab work done today you will be contacted with your lab results within the next 2 weeks.  If you have not heard from us then please contact us. The fastest way to get your results is to register for My Chart. ° ° °IF you received an x-ray today, you will receive an invoice from [facility]. Please contact [facility] at [phone] with questions or concerns regarding your invoice.  ° °IF you received labwork today, you will receive an invoice from [facility]. Please contact [facility] at [phone] with questions or concerns regarding your invoice.  ° °Our billing staff will not be able to assist you with questions regarding bills from these companies. ° °You will be contacted with the lab results as soon as they are available. The fastest way to get your results is to activate your My Chart account. Instructions are located on the last page of this paperwork. If you have not heard from us regarding the results in 2 weeks, please contact this office. °  ° ° ° °

## 2020-02-06 NOTE — Progress Notes (Signed)
Telemedicine Encounter- SOAP NOTE Established Patient Patient: Home  Provider: Office     This telephone encounter was conducted with the patient's (or proxy's) verbal consent via audio telecommunications: yes/no: Yes Patient was instructed to have this encounter in a suitably private space; and to only have persons present to whom they give permission to participate. In addition, patient identity was confirmed by use of name plus two identifiers (DOB and address).  I discussed the limitations, risks, security and privacy concerns of performing an evaluation and management service by telephone and the availability of in person appointments. I also discussed with the patient that there may be a patient responsible charge related to this service. The patient expressed understanding and agreed to proceed.  I spent a total of TIME; 0 MIN TO 60 MIN: 20 minutes talking with the patient or their proxy.  Chief Complaint  Patient presents with  . Sinus Problem    Sinus pressure , drainage , watery eyes X 5 days tried otc decongestants not helping   . R ear Pain     X 2 weeks ago   . Medication Refill    Triamcinolone refill in larger sizes for skin condition    Subjective   Billy Bray is a 50 y.o. male established patient. Telephone visit today complaining of possible sinus infection that started about 1 week ago.  Complaining of sinus congestion, right earache, headaches, nausea.  Taking Mucinex DM and acetaminophen for headache.  Scheduled for COVID test tomorrow.  No other significant symptoms.  Denies difficulty breathing or chest pain.  Able to eat and drink.  Denies nausea or vomiting.  Denies abdominal pain or diarrhea.  HPI   Patient Active Problem List   Diagnosis Date Noted  . Essential hypertension 06/27/2018  . Paresthesias 06/27/2018  . Chronic pain syndrome 08/31/2017  . Degeneration of intervertebral disc of cervical region 09/13/2016  . TMJ dysfunction 08/08/2016  .  Cervical spondylolysis 08/05/2016  . Polyarthralgia 05/24/2016  . Asthma 09/14/2015  . Secondary male hypogonadism 06/17/2015  . ADD (attention deficit disorder) 09/02/2014  . Neuropathy 09/02/2014    Past Medical History:  Diagnosis Date  . Allergy   . Arthritis   . Asthma   . Depression   . Kidney stones     Current Outpatient Medications  Medication Sig Dispense Refill  . albuterol (PROVENTIL) (2.5 MG/3ML) 0.083% nebulizer solution Take 3 mLs (2.5 mg total) by nebulization every 6 (six) hours as needed for wheezing or shortness of breath. 150 mL 1  . albuterol (VENTOLIN HFA) 108 (90 Base) MCG/ACT inhaler Inhale 1-2 puffs into the lungs every 6 (six) hours as needed for wheezing or shortness of breath. 18 g 1  . amLODipine (NORVASC) 5 MG tablet TAKE 1 TABLET BY MOUTH EVERY DAY 90 tablet 1  . amphetamine-dextroamphetamine (ADDERALL XR) 30 MG 24 hr capsule Take 1 capsule (30 mg total) by mouth daily. 30 capsule 0  . amphetamine-dextroamphetamine (ADDERALL) 20 MG tablet Take 1 tablet (20 mg total) by mouth daily as needed. 30 tablet 0  . esomeprazole (NEXIUM) 40 MG capsule TAKE 1 CAPSULE BY MOUTH EVERY DAY 90 capsule 1  . gabapentin (NEURONTIN) 300 MG capsule Take one capsule in am, early afternoon, and three capsules at bedtime. 450 capsule 1  . ketoconazole (NIZORAL) 2 % cream APPLY TO AFFECTED AREA EVERY DAY 15 g 3  . triamcinolone (KENALOG) 0.1 % APPLY TO AFFECTED AREA TWICE A DAY 30 g 3  No current facility-administered medications for this visit.    Allergies  Allergen Reactions  . Amoxicillin Hives    Social History   Socioeconomic History  . Marital status: Divorced    Spouse name: Not on file  . Number of children: 2  . Years of education: 17  . Highest education level: Not on file  Occupational History  . Occupation: Personnel officer  Tobacco Use  . Smoking status: Former Games developer  . Smokeless tobacco: Never Used  Substance and Sexual Activity  . Alcohol use: No     Alcohol/week: 0.0 standard drinks  . Drug use: Yes    Types: Marijuana  . Sexual activity: Not Currently  Other Topics Concern  . Not on file  Social History Narrative   Fun: Entertainment sounds/lights for concerts,   Denies religious beliefs effecting health care.    Social Determinants of Health   Financial Resource Strain: Not on file  Food Insecurity: Not on file  Transportation Needs: Not on file  Physical Activity: Not on file  Stress: Not on file  Social Connections: Not on file  Intimate Partner Violence: Not on file    Review of Systems  Constitutional: Negative.  Negative for chills and fever.  HENT: Positive for congestion, ear pain and sinus pain. Negative for sore throat.   Respiratory: Positive for cough. Negative for shortness of breath.   Cardiovascular: Negative for chest pain and palpitations.  Gastrointestinal: Positive for nausea. Negative for diarrhea and vomiting.  Genitourinary: Negative.  Negative for dysuria and hematuria.  Musculoskeletal: Negative for myalgias.  Skin: Positive for rash.  Neurological: Positive for headaches. Negative for dizziness.  All other systems reviewed and are negative.   Objective  Alert and oriented x3 in no apparent respiratory distress Vitals as reported by the patient: There were no vitals filed for this visit.  [name] was seen today for sinus problem, r ear pain  and medication refill.  Diagnoses and all orders for this visit:  Acute non-recurrent maxillary sinusitis -     azithromycin (ZITHROMAX) 250 MG tablet; Sig as indicated  Rash and nonspecific skin eruption -     triamcinolone (KENALOG) 0.1 %; APPLY TO AFFECTED AREA TWICE A DAY  Sinus headache -     traMADol (ULTRAM) 50 MG tablet; Take 1 tablet (50 mg total) by mouth every 8 (eight) hours as needed for up to 5 days.  Sinus congestion -     pseudoephedrine-guaifenesin (MUCINEX D) 60-600 MG 12 hr tablet; Take 1 tablet by mouth every 12 (twelve) hours  for 5 days.   Clinically stable.  No red flag signs or symptoms. Medications as prescribed. Scheduled for COVID test tomorrow. COVID advice given.  ED precautions given. Advised to contact the office if no better or worse during the next several days.   I discussed the assessment and treatment plan with the patient. The patient was provided an opportunity to ask questions and all were answered. The patient agreed with the plan and demonstrated an understanding of the instructions.   The patient was advised to call back or seek an in-person evaluation if the symptoms worsen or if the condition fails to improve as anticipated.  I provided 20 minutes of non-face-to-face time during this encounter.  Georgina Quint, MD  Primary Care at [facility]

## 2020-02-07 DIAGNOSIS — Z20822 Contact with and (suspected) exposure to covid-19: Secondary | ICD-10-CM | POA: Diagnosis not present

## 2020-02-10 ENCOUNTER — Other Ambulatory Visit: Payer: Self-pay | Admitting: Emergency Medicine

## 2020-02-10 DIAGNOSIS — I10 Essential (primary) hypertension: Secondary | ICD-10-CM

## 2020-02-15 ENCOUNTER — Encounter: Payer: Self-pay | Admitting: Emergency Medicine

## 2020-02-17 NOTE — Telephone Encounter (Signed)
Sagardia pt but he is out until Wed.  Pt had Video visit 02/06/2020 for rt ear ache and congestion pt reports finished zpak and mucinex and only slightly better. Need new ov for re-evaluation?

## 2020-02-25 DIAGNOSIS — F419 Anxiety disorder, unspecified: Secondary | ICD-10-CM | POA: Diagnosis not present

## 2020-02-25 DIAGNOSIS — Z79899 Other long term (current) drug therapy: Secondary | ICD-10-CM | POA: Diagnosis not present

## 2020-02-25 DIAGNOSIS — F902 Attention-deficit hyperactivity disorder, combined type: Secondary | ICD-10-CM | POA: Diagnosis not present

## 2020-02-25 DIAGNOSIS — F338 Other recurrent depressive disorders: Secondary | ICD-10-CM | POA: Diagnosis not present

## 2020-02-27 ENCOUNTER — Other Ambulatory Visit: Payer: Self-pay

## 2020-02-27 ENCOUNTER — Telehealth (INDEPENDENT_AMBULATORY_CARE_PROVIDER_SITE_OTHER): Payer: BC Managed Care – PPO | Admitting: Emergency Medicine

## 2020-02-27 ENCOUNTER — Encounter: Payer: Self-pay | Admitting: Emergency Medicine

## 2020-02-27 VITALS — Ht 71.0 in | Wt 237.0 lb

## 2020-02-27 DIAGNOSIS — R0989 Other specified symptoms and signs involving the circulatory and respiratory systems: Secondary | ICD-10-CM

## 2020-02-27 DIAGNOSIS — H9201 Otalgia, right ear: Secondary | ICD-10-CM | POA: Diagnosis not present

## 2020-02-27 DIAGNOSIS — R0981 Nasal congestion: Secondary | ICD-10-CM

## 2020-02-27 DIAGNOSIS — J0101 Acute recurrent maxillary sinusitis: Secondary | ICD-10-CM

## 2020-02-27 DIAGNOSIS — Z8709 Personal history of other diseases of the respiratory system: Secondary | ICD-10-CM

## 2020-02-27 MED ORDER — PREDNISONE 20 MG PO TABS: 40.0000 mg | ORAL_TABLET | Freq: Every day | ORAL | 0 refills | Status: AC

## 2020-02-27 MED ORDER — DOXYCYCLINE HYCLATE 100 MG PO TABS: 100.0000 mg | ORAL_TABLET | Freq: Two times a day (BID) | ORAL | 0 refills | Status: DC

## 2020-02-27 MED ORDER — PSEUDOEPHEDRINE-GUAIFENESIN ER 60-600 MG PO TB12: 1.0000 | ORAL_TABLET | Freq: Two times a day (BID) | ORAL | 1 refills | Status: AC

## 2020-02-27 NOTE — Progress Notes (Signed)
Telemedicine Encounter- SOAP NOTE Established Patient Patient: Home  Provider: Office     This telephone encounter was conducted with the patient's (or proxy's) verbal consent via audio telecommunications: yes/no: Yes Patient was instructed to have this encounter in a suitably private space; and to only have persons present to whom they give permission to participate. In addition, patient identity was confirmed by use of name plus two identifiers (DOB and address).  I discussed the limitations, risks, security and privacy concerns of performing an evaluation and management service by telephone and the availability of in person appointments. I also discussed with the patient that there may be a patient responsible charge related to this service. The patient expressed understanding and agreed to proceed.  I spent a total of TIME; 0 MIN TO 60 MIN: 20 minutes talking with the patient or their proxy.  Chief Complaint  Patient presents with  . Ear Pain    Per patient for 1 month in the right ear.  . Nasal Congestion  . Asthma    Per patient chest tightness used his albuterol inhaler and nebulizer. Tested for Covid a month ago with negative results.    Subjective   Billy Bray is a 50 y.o. male established patient. Telephone visit today for persistent sinus symptoms.  Telemedicine visit with me on 02/06/2020.  Prescribed azithromycin.  Negative Covid test.  Still complaining of nasal and chest congestion, sinus congestion.  Also complaining of right ear pain with intermittent wheezing in the chest.  Using albuterol nebulizer. Denies fever or chills.  Able to eat and drink.  Denies nausea or vomiting.  Denies abdominal pain or diarrhea.  Denies chest pain or palpitations.  Denies difficulty breathing.  Denies any other significant symptoms.  HPI   Patient Active Problem List   Diagnosis Date Noted  . Essential hypertension 06/27/2018  . Paresthesias 06/27/2018  . Chronic pain syndrome  08/31/2017  . Degeneration of intervertebral disc of cervical region 09/13/2016  . TMJ dysfunction 08/08/2016  . Cervical spondylolysis 08/05/2016  . Polyarthralgia 05/24/2016  . Asthma 09/14/2015  . Secondary male hypogonadism 06/17/2015  . ADD (attention deficit disorder) 09/02/2014  . Neuropathy 09/02/2014    Past Medical History:  Diagnosis Date  . Allergy   . Arthritis   . Asthma   . Depression   . Kidney stones     Current Outpatient Medications  Medication Sig Dispense Refill  . albuterol (PROVENTIL) (2.5 MG/3ML) 0.083% nebulizer solution Take 3 mLs (2.5 mg total) by nebulization every 6 (six) hours as needed for wheezing or shortness of breath. 150 mL 1  . albuterol (VENTOLIN HFA) 108 (90 Base) MCG/ACT inhaler Inhale 1-2 puffs into the lungs every 6 (six) hours as needed for wheezing or shortness of breath. 18 g 1  . amLODipine (NORVASC) 5 MG tablet TAKE 1 TABLET BY MOUTH EVERY DAY 90 tablet 1  . amphetamine-dextroamphetamine (ADDERALL XR) 30 MG 24 hr capsule Take 1 capsule (30 mg total) by mouth daily. 30 capsule 0  . amphetamine-dextroamphetamine (ADDERALL) 20 MG tablet Take 1 tablet (20 mg total) by mouth daily as needed. 30 tablet 0  . doxycycline (VIBRA-TABS) 100 MG tablet Take 1 tablet (100 mg total) by mouth 2 (two) times daily. 20 tablet 0  . gabapentin (NEURONTIN) 300 MG capsule Take one capsule in am, early afternoon, and three capsules at bedtime. 450 capsule 1  . predniSONE (DELTASONE) 20 MG tablet Take 2 tablets (40 mg total) by mouth daily with  breakfast for 5 days. 10 tablet 0  . pseudoephedrine-guaifenesin (MUCINEX D) 60-600 MG 12 hr tablet Take 1 tablet by mouth every 12 (twelve) hours for 5 days. 12 tablet 1  . azithromycin (ZITHROMAX) 250 MG tablet Sig as indicated (Patient not taking: Reported on 02/27/2020) 6 tablet 0  . esomeprazole (NEXIUM) 40 MG capsule TAKE 1 CAPSULE BY MOUTH EVERY DAY (Patient not taking: Reported on 02/27/2020) 90 capsule 1  .  ketoconazole (NIZORAL) 2 % cream APPLY TO AFFECTED AREA EVERY DAY 15 g 3  . triamcinolone (KENALOG) 0.1 % APPLY TO AFFECTED AREA TWICE A DAY 453.6 g 1   No current facility-administered medications for this visit.    Allergies  Allergen Reactions  . Amoxicillin Hives    Social History   Socioeconomic History  . Marital status: Divorced    Spouse name: Not on file  . Number of children: 2  . Years of education: 53  . Highest education level: Not on file  Occupational History  . Occupation: Personnel officer  Tobacco Use  . Smoking status: Former Games developer  . Smokeless tobacco: Never Used  Substance and Sexual Activity  . Alcohol use: No    Alcohol/week: 0.0 standard drinks  . Drug use: Yes    Types: Marijuana  . Sexual activity: Not Currently  Other Topics Concern  . Not on file  Social History Narrative   Fun: Entertainment sounds/lights for concerts,   Denies religious beliefs effecting health care.    Social Determinants of Health   Financial Resource Strain: Not on file  Food Insecurity: Not on file  Transportation Needs: Not on file  Physical Activity: Not on file  Stress: Not on file  Social Connections: Not on file  Intimate Partner Violence: Not on file    Review of Systems  Constitutional: Negative.  Negative for chills and fever.  HENT: Positive for congestion. Negative for sore throat.   Respiratory: Positive for wheezing. Negative for cough and shortness of breath.   Cardiovascular: Negative.  Negative for chest pain and palpitations.  Gastrointestinal: Negative for abdominal pain, nausea and vomiting.  Genitourinary: Negative.  Negative for dysuria.  Musculoskeletal: Negative.  Negative for back pain, myalgias and neck pain.  Skin: Negative.  Negative for rash.  Neurological: Negative.  Negative for dizziness and headaches.  All other systems reviewed and are negative.   Objective  Alert and oriented x3 in no apparent respiratory distress Vitals as  reported by the patient: Today's Vitals   02/27/20 1227  Weight: 237 lb (107.5 kg)  Height: 5\' 11"  (1.803 m)    Billy Bray was seen today for ear pain, nasal congestion and asthma.  Diagnoses and all orders for this visit:  Nasal congestion -     pseudoephedrine-guaifenesin (MUCINEX D) 60-600 MG 12 hr tablet; Take 1 tablet by mouth every 12 (twelve) hours for 5 days.  History of asthma -     predniSONE (DELTASONE) 20 MG tablet; Take 2 tablets (40 mg total) by mouth daily with breakfast for 5 days.  Acute otalgia, right  Acute recurrent maxillary sinusitis -     doxycycline (VIBRA-TABS) 100 MG tablet; Take 1 tablet (100 mg total) by mouth 2 (two) times daily.  Chest congestion -     pseudoephedrine-guaifenesin (MUCINEX D) 60-600 MG 12 hr tablet; Take 1 tablet by mouth every 12 (twelve) hours for 5 days. -     predniSONE (DELTASONE) 20 MG tablet; Take 2 tablets (40 mg total) by mouth daily with breakfast for  5 days.  Clinically stable.  No red flag signs or symptoms.  Take medications as prescribed. Still has persistent sinus infection.  Possible ear infection as well. Advised to contact the office if no better or worse in the next several days.   I discussed the assessment and treatment plan with the patient. The patient was provided an opportunity to ask questions and all were answered. The patient agreed with the plan and demonstrated an understanding of the instructions.   The patient was advised to call back or seek an in-person evaluation if the symptoms worsen or if the condition fails to improve as anticipated.  I provided 20 minutes of non-face-to-face time during this encounter.  Georgina Quint, MD  Primary Care at [facility]

## 2020-02-28 DIAGNOSIS — Z03818 Encounter for observation for suspected exposure to other biological agents ruled out: Secondary | ICD-10-CM | POA: Diagnosis not present

## 2020-02-28 DIAGNOSIS — Z20822 Contact with and (suspected) exposure to covid-19: Secondary | ICD-10-CM | POA: Diagnosis not present

## 2020-03-16 ENCOUNTER — Encounter: Payer: Self-pay | Admitting: Emergency Medicine

## 2020-03-16 ENCOUNTER — Other Ambulatory Visit: Payer: Self-pay

## 2020-03-16 ENCOUNTER — Ambulatory Visit: Payer: BC Managed Care – PPO | Admitting: Emergency Medicine

## 2020-03-16 VITALS — BP 122/80 | HR 102 | Temp 98.8°F | Resp 16 | Ht 71.0 in | Wt 233.0 lb

## 2020-03-16 DIAGNOSIS — R12 Heartburn: Secondary | ICD-10-CM

## 2020-03-16 DIAGNOSIS — M549 Dorsalgia, unspecified: Secondary | ICD-10-CM

## 2020-03-16 DIAGNOSIS — R0981 Nasal congestion: Secondary | ICD-10-CM

## 2020-03-16 DIAGNOSIS — H9201 Otalgia, right ear: Secondary | ICD-10-CM

## 2020-03-16 DIAGNOSIS — Z1211 Encounter for screening for malignant neoplasm of colon: Secondary | ICD-10-CM

## 2020-03-16 DIAGNOSIS — B349 Viral infection, unspecified: Secondary | ICD-10-CM

## 2020-03-16 DIAGNOSIS — Z8709 Personal history of other diseases of the respiratory system: Secondary | ICD-10-CM

## 2020-03-16 DIAGNOSIS — R0789 Other chest pain: Secondary | ICD-10-CM

## 2020-03-16 MED ORDER — METHYLPREDNISOLONE 4 MG PO TBPK: ORAL_TABLET | ORAL | 1 refills | Status: DC

## 2020-03-16 MED ORDER — PANTOPRAZOLE SODIUM 40 MG PO TBEC: 40.0000 mg | DELAYED_RELEASE_TABLET | Freq: Every day | ORAL | 3 refills | Status: DC

## 2020-03-16 NOTE — Patient Instructions (Addendum)
If you have lab work done today you will be contacted with your lab results within the next 2 weeks.  If you have not heard from [place] then please contact us. The fastest way to get your results is to register for My Chart.   IF you received an x-ray today, you will receive an invoice from [facility]. Please contact [facility] at [phone] with questions or concerns regarding your invoice.   IF you received labwork today, you will receive an invoice from [facility]. Please contact [facility] at [phone] with questions or concerns regarding your invoice.   Our billing staff will not be able to assist you with questions regarding bills from these companies.  You will be contacted with the lab results as soon as they are available. The fastest way to get your results is to activate your My Chart account. Instructions are located on the last page of this paperwork. If you have not heard from [place] regarding the results in 2 weeks, please contact this office.     Earache, Adult An earache, or ear pain, can be caused by many things, including:  An infection.  Ear wax buildup.  Ear pressure.  Something in the ear that should not be there (foreign body).  A sore throat.  Tooth problems.  Jaw problems. Treatment of the earache will depend on the cause. If the cause is not clear or cannot be determined, you may need to watch your symptoms until your earache goes away or until a cause is found. Follow these instructions at home: Medicines  Take or apply over-the-counter and prescription medicines only as told by your health care provider.  If you were prescribed an antibiotic medicine, use it as told by your health care provider. Do not stop using the antibiotic even if you start to feel better.  Do not put anything in your ear other than medicine that is prescribed by your health care provider. Managing pain If directed, apply heat to the affected area as often  as told by your health care provider. Use the heat source that your health care provider recommends, such as a moist heat pack or a heating pad.  Place a towel between your skin and the heat source.  Leave the heat on for 20-30 minutes.  Remove the heat if your skin turns bright red. This is especially important if you are unable to feel pain, heat, or cold. You may have a greater risk of getting burned. If directed, put ice on the affected area as often as told by your health care provider. To do this:  Put ice in a plastic bag.  Place a towel between your skin and the bag.  Leave the ice on for 20 minutes, 2-3 times a day.      General instructions  Pay attention to any changes in your symptoms.  Try resting in an upright position instead of lying down. This may help to reduce pressure in your ear and relieve pain.  Chew gum if it helps to relieve your ear pain.  Treat any allergies as told by your health care provider.  Drink enough fluid to keep your urine pale yellow.  It is up to you to get the results of any tests that were done. Ask your health care provider, or the department that is doing the tests, when your results will be ready.  Keep all follow-up visits as told by your health care provider. This is important. Contact a  health care provider if:  Your pain does not improve within 2 days.  Your earache gets worse.  You have new symptoms.  You have a fever. Get help right away if you:  Have a severe headache.  Have a stiff neck.  Have trouble swallowing.  Have redness or swelling behind your ear.  Have fluid or blood coming from your ear.  Have hearing loss.  Feel dizzy. Summary  An earache, or ear pain, can be caused by many things.  Treatment of the earache will depend on the cause. Follow recommendations from your health care provider to treat your ear pain.  If the cause is not clear or cannot be determined, you may need to watch your  symptoms until your earache goes away or until a cause is found.  Keep all follow-up visits as told by your health care provider. This is important. This information is not intended to replace advice given to you by your health care provider. Make sure you discuss any questions you have with your health care provider. Document Revised: 08/18/2018 Document Reviewed: 08/18/2018 Elsevier Patient Education  2021 [employer].

## 2020-03-16 NOTE — Progress Notes (Signed)
Billy Bray 50 y.o.   Chief Complaint  Patient presents with  . Ear Pain    Per patient right ear continues to cause pain for several months  . Nasal Congestion    Per patient thinks it is sinus     HISTORY OF PRESENT ILLNESS: This is a 50 y.o. male sick with ENT issues since late December.  Possible sinus infection.  Took Z-Pak with prednisone.  Followed by doxycycline.  Still having right ear pain with nasal congestion. Also complaining of frequent heartburn.  Off Nexium for a couple months. Also has a history of asthma with occasional wheezing. Covid negative.  Denies difficulty breathing or significant cough. Heartburn leading to occasional chest pain and reflux symptoms. Able to eat and drink.  Denies nausea or vomiting. No other complaints or medical concerns today.  HPI   Prior to Admission medications   Medication Sig Start Date End Date Taking? Authorizing Provider  albuterol (PROVENTIL) (2.5 MG/3ML) 0.083% nebulizer solution Take 3 mLs (2.5 mg total) by nebulization every 6 (six) hours as needed for wheezing or shortness of breath. 04/17/15   Veryl Speakalone, Gregory D, FNP  albuterol (VENTOLIN HFA) 108 (90 Base) MCG/ACT inhaler Inhale 1-2 puffs into the lungs every 6 (six) hours as needed for wheezing or shortness of breath. 07/06/19   Georgina QuintSagardia, Kaneesha Constantino Jose, MD  amLODipine (NORVASC) 5 MG tablet TAKE 1 TABLET BY MOUTH EVERY DAY 02/10/20   Georgina QuintSagardia, Deborra Phegley Jose, MD  amphetamine-dextroamphetamine (ADDERALL XR) 30 MG 24 hr capsule Take 1 capsule (30 mg total) by mouth daily. 07/25/17   Evaristo BuryShambley, Ashleigh N, NP  amphetamine-dextroamphetamine (ADDERALL) 20 MG tablet Take 1 tablet (20 mg total) by mouth daily as needed. 07/25/17   Evaristo BuryShambley, Ashleigh N, NP  azithromycin (ZITHROMAX) 250 MG tablet Sig as indicated Patient not taking: Reported on 02/27/2020 02/06/20   Georgina QuintSagardia, Elford Evilsizer Jose, MD  doxycycline (VIBRA-TABS) 100 MG tablet Take 1 tablet (100 mg total) by mouth 2 (two) times daily. 02/27/20    Georgina QuintSagardia, Travante Knee Jose, MD  esomeprazole (NEXIUM) 40 MG capsule TAKE 1 CAPSULE BY MOUTH EVERY DAY Patient not taking: Reported on 02/27/2020 11/10/19   Georgina QuintSagardia, Latrise Bowland Jose, MD  gabapentin (NEURONTIN) 300 MG capsule Take one capsule in am, early afternoon, and three capsules at bedtime. 12/25/19   Georgina QuintSagardia, Amellia Panik Jose, MD  ketoconazole (NIZORAL) 2 % cream APPLY TO AFFECTED AREA EVERY DAY 08/26/19   Georgina QuintSagardia, Alexya Mcdaris Jose, MD  triamcinolone (KENALOG) 0.1 % APPLY TO AFFECTED AREA TWICE A DAY 02/06/20   Georgina QuintSagardia, Feras Gardella Jose, MD    Allergies  Allergen Reactions  . Amoxicillin Hives    Patient Active Problem List   Diagnosis Date Noted  . Essential hypertension 06/27/2018  . Paresthesias 06/27/2018  . Chronic pain syndrome 08/31/2017  . Degeneration of intervertebral disc of cervical region 09/13/2016  . TMJ dysfunction 08/08/2016  . Cervical spondylolysis 08/05/2016  . Polyarthralgia 05/24/2016  . Asthma 09/14/2015  . Secondary male hypogonadism 06/17/2015  . ADD (attention deficit disorder) 09/02/2014  . Neuropathy 09/02/2014    Past Medical History:  Diagnosis Date  . Allergy   . Arthritis   . Asthma   . Depression   . Kidney stones     Past Surgical History:  Procedure Laterality Date  . BILATERAL CARPAL TUNNEL RELEASE  2004    Social History   Socioeconomic History  . Marital status: Divorced    Spouse name: Not on file  . Number of children: 2  . Years of  education: 14  . Highest education level: Not on file  Occupational History  . Occupation: Personnel officer  Tobacco Use  . Smoking status: Former Games developer  . Smokeless tobacco: Never Used  Substance and Sexual Activity  . Alcohol use: No    Alcohol/week: 0.0 standard drinks  . Drug use: Yes    Types: Marijuana  . Sexual activity: Not Currently  Other Topics Concern  . Not on file  Social History Narrative   Fun: Entertainment sounds/lights for concerts,   Denies religious beliefs effecting health care.     Social Determinants of Health   Financial Resource Strain: Not on file  Food Insecurity: Not on file  Transportation Needs: Not on file  Physical Activity: Not on file  Stress: Not on file  Social Connections: Not on file  Intimate Partner Violence: Not on file    Family History  Problem Relation Age of Onset  . Healthy Mother   . Healthy Father   . Stroke Maternal Grandmother   . Diabetes Maternal Grandmother   . Stroke Maternal Grandfather   . Diabetes Maternal Grandfather   . Colon cancer Paternal Grandfather      Review of Systems  Constitutional: Negative.  Negative for chills, fever and malaise/fatigue.  HENT: Positive for congestion, ear pain and sinus pain. Negative for ear discharge, hearing loss, nosebleeds and sore throat.   Respiratory: Negative.  Negative for cough and shortness of breath.   Cardiovascular: Positive for chest pain. Negative for palpitations.  Gastrointestinal: Positive for heartburn. Negative for abdominal pain, diarrhea, nausea and vomiting.  Genitourinary: Negative.  Negative for dysuria and hematuria.  Musculoskeletal: Positive for back pain.  Skin: Negative.  Negative for rash.  Neurological: Negative.  Negative for dizziness and headaches.  All other systems reviewed and are negative.  Today's Vitals   03/16/20 0809  BP: 122/80  Pulse: (!) 102  Resp: 16  Temp: 98.8 F (37.1 C)  TempSrc: Temporal  SpO2: 96%  Weight: 233 lb (105.7 kg)  Height: 5\' 11"  (1.803 m)   Body mass index is 32.5 kg/m.   Physical Exam Vitals reviewed.  Constitutional:      Appearance: He is well-developed.  HENT:     Head: Normocephalic.     Right Ear: Tympanic membrane, ear canal and external ear normal.     Left Ear: Tympanic membrane, ear canal and external ear normal.     Nose: Nose normal.     Mouth/Throat:     Mouth: Mucous membranes are moist.     Pharynx: Oropharynx is clear. No oropharyngeal exudate.  Eyes:     Extraocular Movements:  Extraocular movements intact.     Conjunctiva/sclera: Conjunctivae normal.     Pupils: Pupils are equal, round, and reactive to light.  Neck:     Vascular: No carotid bruit.  Cardiovascular:     Rate and Rhythm: Normal rate and regular rhythm.     Pulses: Normal pulses.     Heart sounds: Normal heart sounds.  Pulmonary:     Effort: Pulmonary effort is normal.     Breath sounds: Normal breath sounds.  Musculoskeletal:        General: Normal range of motion.     Cervical back: Normal and normal range of motion. No tenderness.     Thoracic back: Normal.     Lumbar back: Normal.  Lymphadenopathy:     Cervical: No cervical adenopathy.  Skin:    General: Skin is warm and dry.  Capillary Refill: Capillary refill takes less than 2 seconds.  Neurological:     General: No focal deficit present.     Mental Status: He is alert and oriented to person, place, and time.  Psychiatric:        Mood and Affect: Mood normal.        Behavior: Behavior normal.      ASSESSMENT & PLAN: Kendle was seen today for ear pain, nasal congestion and chest pain.  Diagnoses and all orders for this visit:  Otalgia of right ear -     Sedimentation Rate -     CBC with Differential/Platelet -     Comprehensive metabolic panel -     Ambulatory referral to ENT  Screening for colon cancer -     Ambulatory referral to Gastroenterology  Sinus congestion -     methylPREDNISolone (MEDROL DOSEPAK) 4 MG TBPK tablet; Sig as indicated -     Ambulatory referral to ENT  Heartburn -     pantoprazole (PROTONIX) 40 MG tablet; Take 1 tablet (40 mg total) by mouth daily.  History of asthma  Atypical chest pain  Viral illness  Bilateral back pain, unspecified back location, unspecified chronicity    Patient Instructions       If you have lab work done today you will be contacted with your lab results within the next 2 weeks.  If you have not heard from [place] then please contact us. The fastest way to get  your results is to register for My Chart.   IF you received an x-ray today, you will receive an invoice from [facility]. Please contact [facility] at [phone] with questions or concerns regarding your invoice.   IF you received labwork today, you will receive an invoice from [facility]. Please contact [facility] at [phone] with questions or concerns regarding your invoice.   Our billing staff will not be able to assist you with questions regarding bills from these companies.  You will be contacted with the lab results as soon as they are available. The fastest way to get your results is to activate your My Chart account. Instructions are located on the last page of this paperwork. If you have not heard from [place] regarding the results in 2 weeks, please contact this office.     Earache, Adult An earache, or ear pain, can be caused by many things, including:  An infection.  Ear wax buildup.  Ear pressure.  Something in the ear that should not be there (foreign body).  A sore throat.  Tooth problems.  Jaw problems. Treatment of the earache will depend on the cause. If the cause is not clear or cannot be determined, you may need to watch your symptoms until your earache goes away or until a cause is found. Follow these instructions at home: Medicines  Take or apply over-the-counter and prescription medicines only as told by your health care provider.  If you were prescribed an antibiotic medicine, use it as told by your health care provider. Do not stop using the antibiotic even if you start to feel better.  Do not put anything in your ear other than medicine that is prescribed by your health care provider. Managing pain If directed, apply heat to the affected area as often as told by your health care provider. Use the heat source that your health care provider recommends, such as a moist heat pack or a heating pad.  Place a towel between your skin and the  heat source.  Leave the heat on for 20-30 minutes.  Remove the heat if your skin turns bright red. This is especially important if you are unable to feel pain, heat, or cold. You may have a greater risk of getting burned. If directed, put ice on the affected area as often as told by your health care provider. To do this:  Put ice in a plastic bag.  Place a towel between your skin and the bag.  Leave the ice on for 20 minutes, 2-3 times a day.      General instructions  Pay attention to any changes in your symptoms.  Try resting in an upright position instead of lying down. This may help to reduce pressure in your ear and relieve pain.  Chew gum if it helps to relieve your ear pain.  Treat any allergies as told by your health care provider.  Drink enough fluid to keep your urine pale yellow.  It is up to you to get the results of any tests that were done. Ask your health care provider, or the department that is doing the tests, when your results will be ready.  Keep all follow-up visits as told by your health care provider. This is important. Contact a health care provider if:  Your pain does not improve within 2 days.  Your earache gets worse.  You have new symptoms.  You have a fever. Get help right away if you:  Have a severe headache.  Have a stiff neck.  Have trouble swallowing.  Have redness or swelling behind your ear.  Have fluid or blood coming from your ear.  Have hearing loss.  Feel dizzy. Summary  An earache, or ear pain, can be caused by many things.  Treatment of the earache will depend on the cause. Follow recommendations from your health care provider to treat your ear pain.  If the cause is not clear or cannot be determined, you may need to watch your symptoms until your earache goes away or until a cause is found.  Keep all follow-up visits as told by your health care provider. This is important. This information is not intended to  replace advice given to you by your health care provider. Make sure you discuss any questions you have with your health care provider. Document Revised: 08/18/2018 Document Reviewed: 08/18/2018 Elsevier Patient Education  2021 Elsevier Inc.      Edwina Barth, MD [facility]

## 2020-03-17 ENCOUNTER — Encounter: Payer: Self-pay | Admitting: *Deleted

## 2020-03-17 LAB — CBC WITH DIFFERENTIAL/PLATELET
Basophils Absolute: 0.1 10*3/uL (ref 0.0–0.2)
Basos: 1 %
EOS (ABSOLUTE): 0.1 10*3/uL (ref 0.0–0.4)
Eos: 2 %
Hematocrit: 45.3 % (ref 37.5–51.0)
Hemoglobin: 15.8 g/dL (ref 13.0–17.7)
Immature Grans (Abs): 0 10*3/uL (ref 0.0–0.1)
Immature Granulocytes: 0 %
Lymphocytes Absolute: 1.5 10*3/uL (ref 0.7–3.1)
Lymphs: 26 %
MCH: 31.2 pg (ref 26.6–33.0)
MCHC: 34.9 g/dL (ref 31.5–35.7)
MCV: 90 fL (ref 79–97)
Monocytes Absolute: 0.7 10*3/uL (ref 0.1–0.9)
Monocytes: 11 %
Neutrophils Absolute: 3.4 10*3/uL (ref 1.4–7.0)
Neutrophils: 60 %
Platelets: 231 10*3/uL (ref 150–450)
RBC: 5.06 x10E6/uL (ref 4.14–5.80)
RDW: 13.1 % (ref 11.6–15.4)
WBC: 5.7 10*3/uL (ref 3.4–10.8)

## 2020-03-17 LAB — COMPREHENSIVE METABOLIC PANEL
ALT: 20 IU/L (ref 0–44)
AST: 16 IU/L (ref 0–40)
Albumin/Globulin Ratio: 2.4 — ABNORMAL HIGH (ref 1.2–2.2)
Albumin: 4.6 g/dL (ref 4.0–5.0)
Alkaline Phosphatase: 55 IU/L (ref 44–121)
BUN/Creatinine Ratio: 16 (ref 9–20)
BUN: 20 mg/dL (ref 6–24)
Bilirubin Total: 0.3 mg/dL (ref 0.0–1.2)
CO2: 22 mmol/L (ref 20–29)
Calcium: 9.1 mg/dL (ref 8.7–10.2)
Chloride: 101 mmol/L (ref 96–106)
Creatinine, Ser: 1.25 mg/dL (ref 0.76–1.27)
GFR calc Af Amer: 78 mL/min/{1.73_m2} (ref >59)
GFR calc non Af Amer: 68 mL/min/{1.73_m2} (ref >59)
Globulin, Total: 1.9 g/dL (ref 1.5–4.5)
Glucose: 115 mg/dL — ABNORMAL HIGH (ref 65–99)
Potassium: 4.5 mmol/L (ref 3.5–5.2)
Sodium: 139 mmol/L (ref 134–144)
Total Protein: 6.5 g/dL (ref 6.0–8.5)

## 2020-03-17 LAB — SEDIMENTATION RATE: Sed Rate: 2 mm/hr (ref 0–15)

## 2020-04-02 ENCOUNTER — Ambulatory Visit (INDEPENDENT_AMBULATORY_CARE_PROVIDER_SITE_OTHER): Payer: BC Managed Care – PPO | Admitting: Otolaryngology

## 2020-04-02 ENCOUNTER — Other Ambulatory Visit: Payer: Self-pay

## 2020-04-02 DIAGNOSIS — J310 Chronic rhinitis: Secondary | ICD-10-CM

## 2020-04-02 DIAGNOSIS — M26609 Unspecified temporomandibular joint disorder, unspecified side: Secondary | ICD-10-CM

## 2020-04-02 DIAGNOSIS — H9201 Otalgia, right ear: Secondary | ICD-10-CM | POA: Diagnosis not present

## 2020-04-02 NOTE — Progress Notes (Signed)
HPI: Billy Bray is a 50 y.o. male who presents is referred by his PCP for evaluation of chronic right ear pain and nasal sinus congestion.  This apparently began around Christmas when he developed a sinus infection and was treated with prednisone as well as a Z-Pak and doxycycline.  The sinus infection seemed to improve but he has continued to have right ear pain as well as some popping and crackling in the right ear.  He has not noted any hearing problems.  He used to have some colored discharge from his nose but has had no colored discharge from his nose recently..  Past Medical History:  Diagnosis Date  . Allergy   . Arthritis   . Asthma   . Depression   . Kidney stones    Past Surgical History:  Procedure Laterality Date  . BILATERAL CARPAL TUNNEL RELEASE  2004   Social History   Socioeconomic History  . Marital status: Divorced    Spouse name: Not on file  . Number of children: 2  . Years of education: 25  . Highest education level: Not on file  Occupational History  . Occupation: Personnel officer  Tobacco Use  . Smoking status: Former Games developer  . Smokeless tobacco: Never Used  Substance and Sexual Activity  . Alcohol use: No    Alcohol/week: 0.0 standard drinks  . Drug use: Yes    Types: Marijuana  . Sexual activity: Not Currently  Other Topics Concern  . Not on file  Social History Narrative   Fun: Entertainment sounds/lights for concerts,   Denies religious beliefs effecting health care.    Social Determinants of Health   Financial Resource Strain: Not on file  Food Insecurity: Not on file  Transportation Needs: Not on file  Physical Activity: Not on file  Stress: Not on file  Social Connections: Not on file   Family History  Problem Relation Age of Onset  . Healthy Mother   . Healthy Father   . Stroke Maternal Grandmother   . Diabetes Maternal Grandmother   . Stroke Maternal Grandfather   . Diabetes Maternal Grandfather   . Colon cancer Paternal  Grandfather    Allergies  Allergen Reactions  . Amoxicillin Hives   Prior to Admission medications   Medication Sig Start Date End Date Taking? Authorizing Provider  albuterol (PROVENTIL) (2.5 MG/3ML) 0.083% nebulizer solution Take 3 mLs (2.5 mg total) by nebulization every 6 (six) hours as needed for wheezing or shortness of breath. 04/17/15   Veryl Speak, FNP  albuterol (VENTOLIN HFA) 108 (90 Base) MCG/ACT inhaler Inhale 1-2 puffs into the lungs every 6 (six) hours as needed for wheezing or shortness of breath. 07/06/19   Georgina Quint, MD  amLODipine (NORVASC) 5 MG tablet TAKE 1 TABLET BY MOUTH EVERY DAY 02/10/20   Georgina Quint, MD  amphetamine-dextroamphetamine (ADDERALL XR) 30 MG 24 hr capsule Take 1 capsule (30 mg total) by mouth daily. 07/25/17   Evaristo Bury, NP  amphetamine-dextroamphetamine (ADDERALL) 20 MG tablet Take 1 tablet (20 mg total) by mouth daily as needed. 07/25/17   Evaristo Bury, NP  azithromycin (ZITHROMAX) 250 MG tablet Sig as indicated Patient not taking: Reported on 03/16/2020 02/06/20   Georgina Quint, MD  doxycycline (VIBRA-TABS) 100 MG tablet Take 1 tablet (100 mg total) by mouth 2 (two) times daily. 02/27/20   Georgina Quint, MD  gabapentin (NEURONTIN) 300 MG capsule Take one capsule in am, early afternoon, and three capsules  at bedtime. 12/25/19   Georgina Quint, MD  ketoconazole (NIZORAL) 2 % cream APPLY TO AFFECTED AREA EVERY DAY 08/26/19   Georgina Quint, MD  methylPREDNISolone (MEDROL DOSEPAK) 4 MG TBPK tablet Sig as indicated 03/16/20   Georgina Quint, MD  pantoprazole (PROTONIX) 40 MG tablet Take 1 tablet (40 mg total) by mouth daily. 03/16/20   Georgina Quint, MD  triamcinolone (KENALOG) 0.1 % APPLY TO AFFECTED AREA TWICE A DAY 02/06/20   Georgina Quint, MD     Positive ROS: Otherwise negative  All other systems have been reviewed and were otherwise negative with the exception of  those mentioned in the HPI and as above.  Physical Exam: Constitutional: Alert, well-appearing, no acute distress Ears: External ears without lesions or tenderness. Ear canals are clear bilaterally.  TMs are clear bilaterally with no middle ear effusion or fluid noted.  On hearing screening with a tuning forks he heard well in both ears with AC > BC bilaterally. Nasal: External nose without lesions. Septum slightly deviated to the right.  He has some scar tissue within the right nasal cavity between the inferior turbinate and the septum.  He has had no previous surgery on the nose although he has used nasal drugs in the past which may have contributed to this..  Nasal endoscopy was performed bilaterally and on nasal endoscopy both middle meatus and sinus regions were clear with no mucopurulent discharge noted.  The nasopharynx was clear.  Eustachian tube regions were unobstructed. Oral: Lips and gums without lesions. Tongue and palate mucosa without lesions. Posterior oropharynx clear.  Tonsil regions are benign bilaterally. Neck: No palpable adenopathy or masses.  On palpation of the TMJ joints he has moderate pain in the right TMJ joint and this is the most likely source of his right ear pain as the ear canal and TM on the right side is normal to evaluation. Respiratory: Breathing comfortably  Skin: No facial/neck lesions or rash noted.  Procedures  Assessment: Mild rhinitis.  No clinical evidence of active infection. Right ear pain related to right TMJ dysfunction.  Plan: Concerning the mild rhinitis would recommend use of Flonase or Nasacort which he already has and or saline rinses as needed. Reviewed with him concerning the TMJ pain causing the right ear pain.  Recommended use of NSAIDs and a soft diet should help improve the TMJ pain and would recommend follow-up with his dentist concerning further evaluation and treatment of this.   Narda Bonds, MD   CC:

## 2020-04-10 IMAGING — CT CT ABD-PELV W/ CM
2 of 5 series · 16 of 46 positions shown, 18 images · IV contrast (omnipaque)
Comparison: Ultrasound 09/27/2018, radiograph 09/27/2018, CT
01/04/2010

CLINICAL DATA: Right upper quadrant pain radiating to the flank

EXAM:
CT ABDOMEN AND PELVIS WITH CONTRAST
TECHNIQUE: Multidetector CT imaging of the abdomen and pelvis was performed
using the standard protocol following bolus administration of
intravenous contrast.
CONTRAST:  100mL OMNIPAQUE IOHEXOL 300 MG/ML  SOLN

[Series 2: axial st · axial · 0.84mm/px · z∈[+1144,+1550]mm · 13 of 95 slices shown, 15 images]
[im 7/95  soft-tissue]
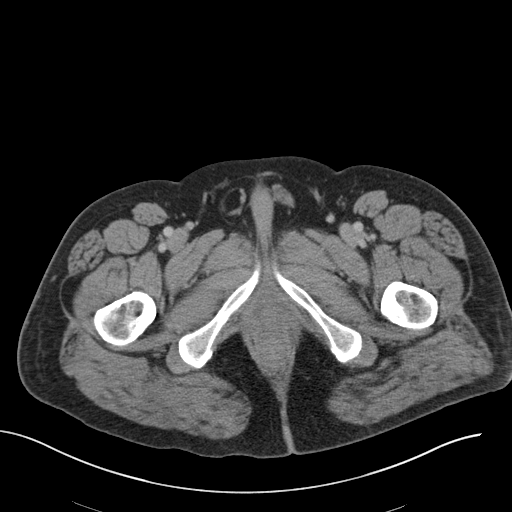
[im 7/95  bone]
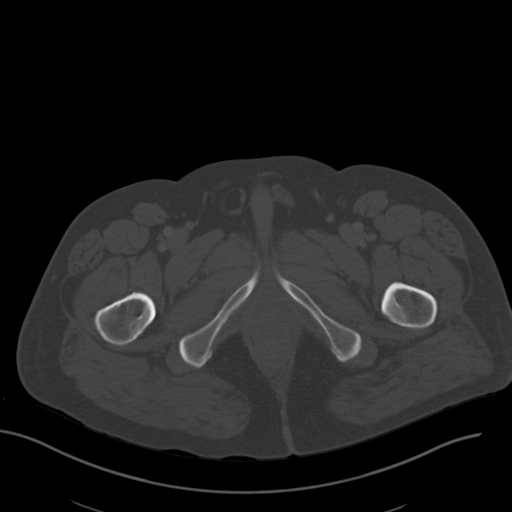
[im 14/95  soft-tissue]
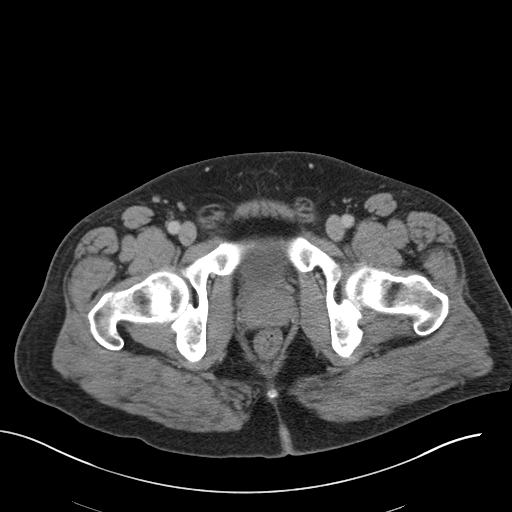
[im 21/95  soft-tissue]
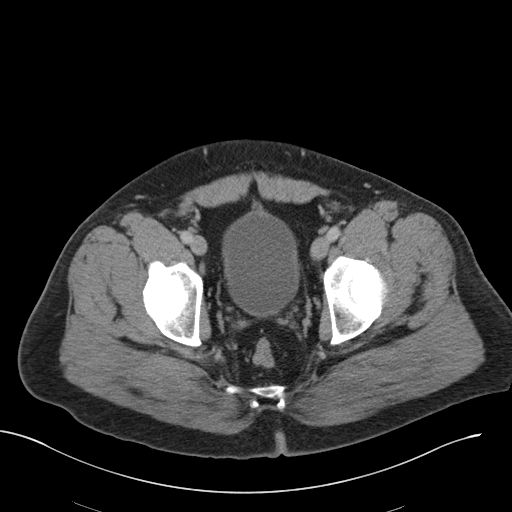
[im 27/95  soft-tissue]
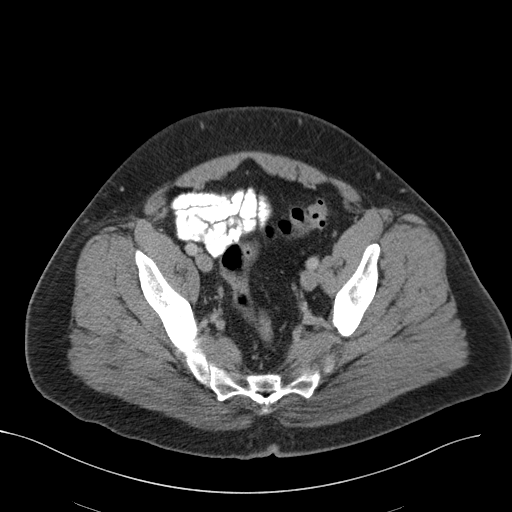
[im 34/95  soft-tissue]
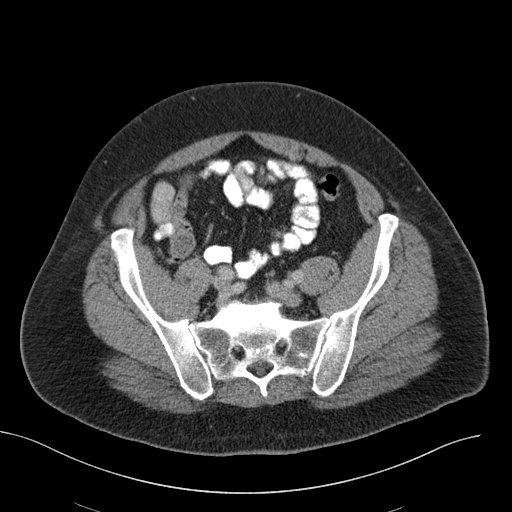
[im 41/95  soft-tissue]
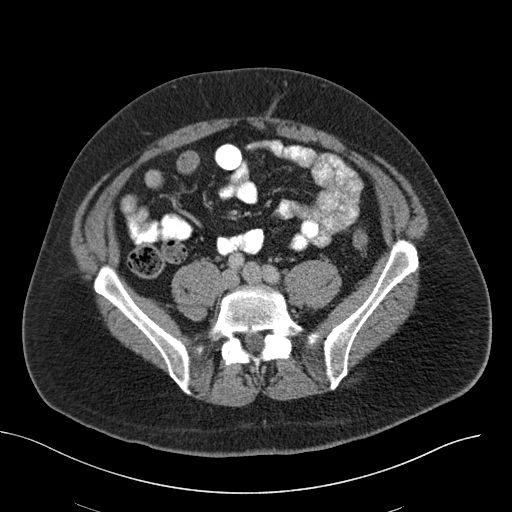
[im 48/95  soft-tissue]
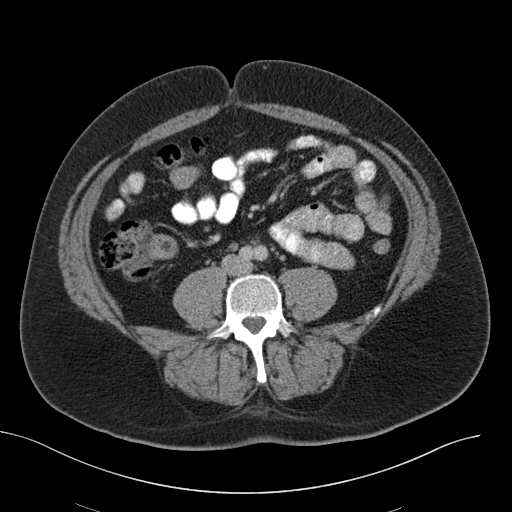
[im 54/95  soft-tissue]
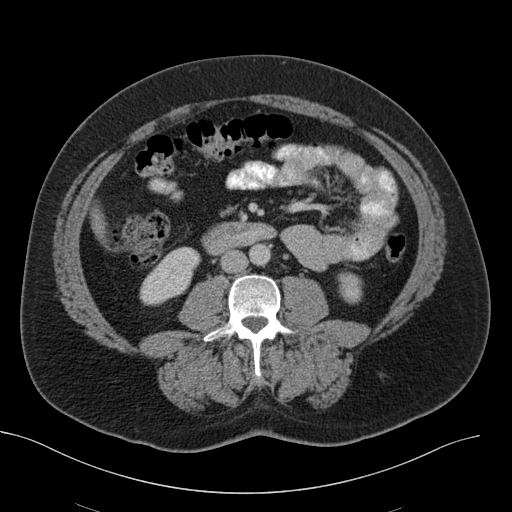
[im 61/95  soft-tissue]
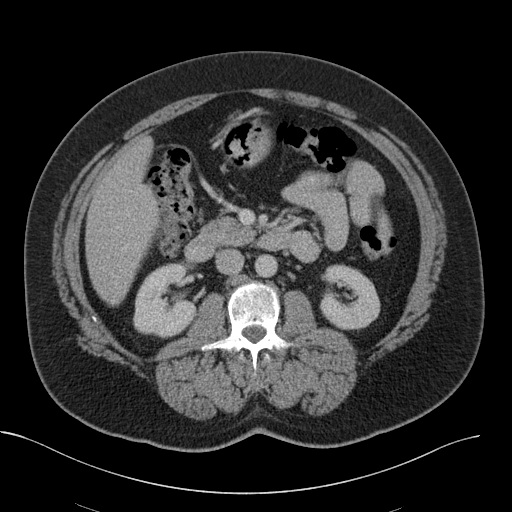
[im 61/95  bone]
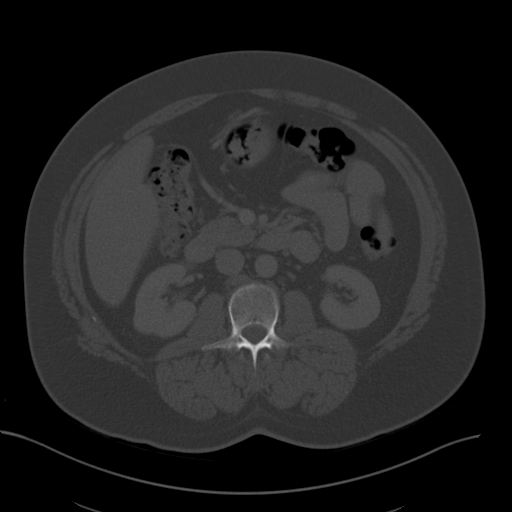
[im 68/95  soft-tissue]
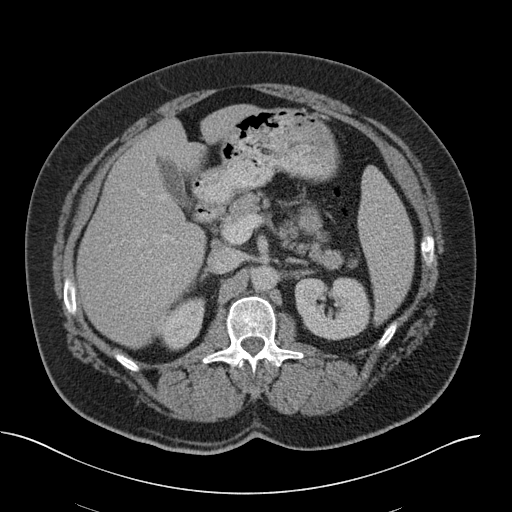
[im 74/95  soft-tissue]
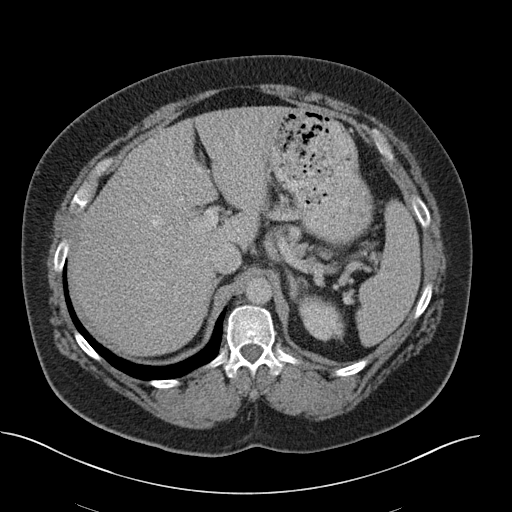
[im 81/95  soft-tissue]
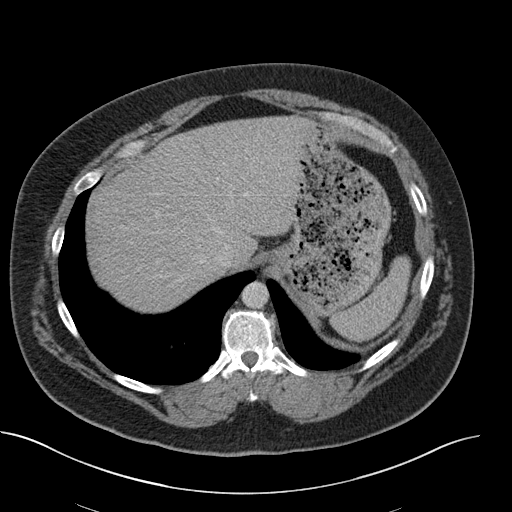
[im 88/95  soft-tissue]
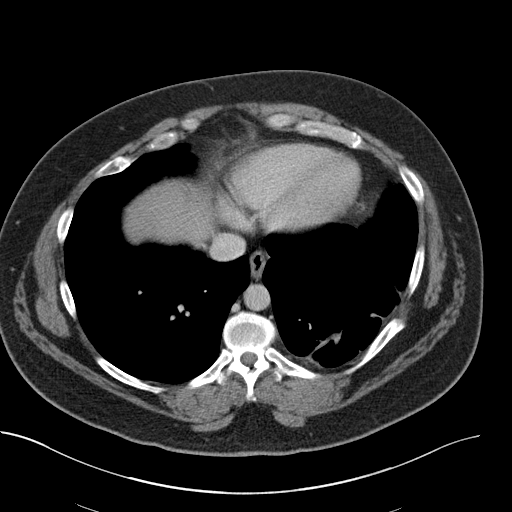

[Series 5: coronal st · coronal · 0.77mm/px · 3 of 164 slices shown]
[im 55/164  soft-tissue]
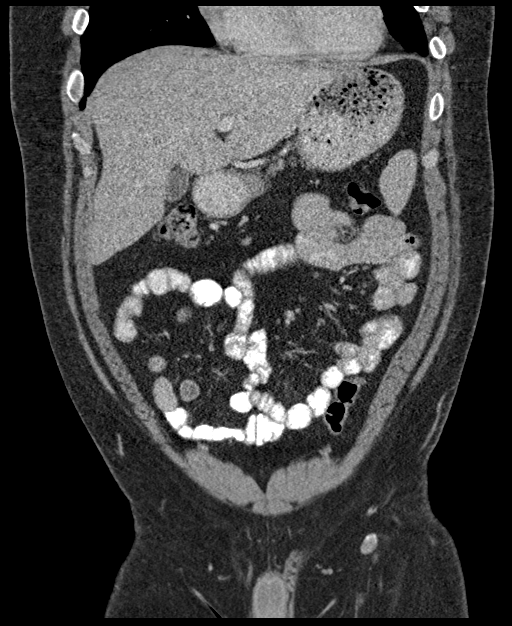
[im 73/164  soft-tissue]
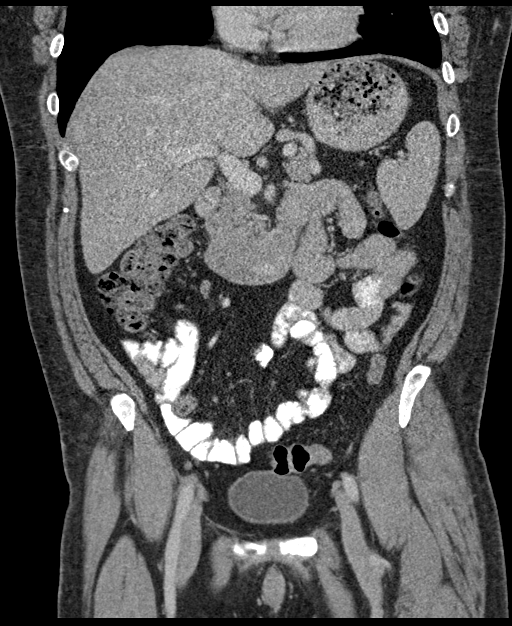
[im 91/164  soft-tissue]
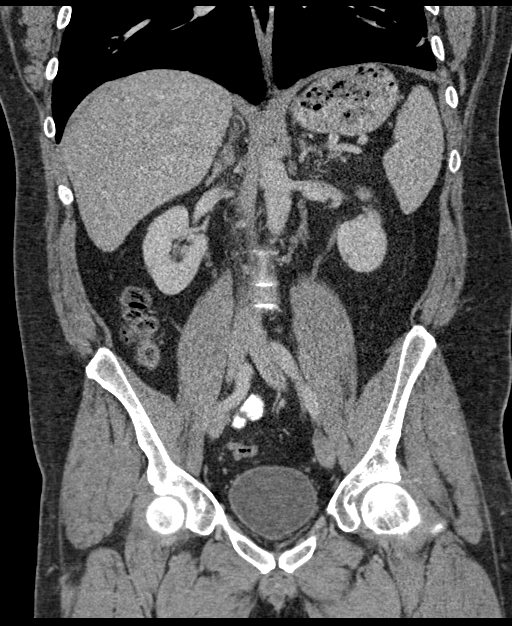

[16 of 46 positions shown; findings below may reference images not displayed]

FINDINGS: Lower chest: Lung bases demonstrate left pleural and parenchymal
scarring at the lung base. No acute consolidation. The heart size is
normal

Hepatobiliary: No focal liver abnormality is seen. No gallstones,
gallbladder wall thickening, or biliary dilatation.

Pancreas: Unremarkable. No pancreatic ductal dilatation or
surrounding inflammatory changes.

Spleen: Normal in size without focal abnormality.

Adrenals/Urinary Tract: Adrenal glands are unremarkable. Kidneys are
normal, without renal calculi, focal lesion, or hydronephrosis.
Bladder is unremarkable.

Stomach/Bowel: Stomach is within normal limits. Appendix appears
normal. No evidence of bowel wall thickening, distention, or
inflammatory changes. Mild sigmoid colon diverticular disease
without acute inflammatory process.

Vascular/Lymphatic: No significant vascular findings are present. No
enlarged abdominal or pelvic lymph nodes.

Reproductive: Prostate is unremarkable.

Other: Negative for free air or free fluid. Small fat in the right
inguinal canal.

Musculoskeletal: Mild superior endplate deformity at L2, new since
7155. Degenerative changes at L5-S1
IMPRESSION: 1. No CT evidence for acute intra-abdominal or pelvic abnormality.
2. Mild sigmoid colon diverticular disease without acute
inflammatory process.
3. Mild superior endplate deformity at L2, probably chronic but new

## 2020-05-28 DIAGNOSIS — F902 Attention-deficit hyperactivity disorder, combined type: Secondary | ICD-10-CM | POA: Diagnosis not present

## 2020-05-28 DIAGNOSIS — F419 Anxiety disorder, unspecified: Secondary | ICD-10-CM | POA: Diagnosis not present

## 2020-05-28 DIAGNOSIS — Z79899 Other long term (current) drug therapy: Secondary | ICD-10-CM | POA: Diagnosis not present

## 2020-06-08 ENCOUNTER — Other Ambulatory Visit: Payer: Self-pay | Admitting: Emergency Medicine

## 2020-06-08 DIAGNOSIS — G894 Chronic pain syndrome: Secondary | ICD-10-CM

## 2020-06-24 ENCOUNTER — Ambulatory Visit: Payer: BC Managed Care – PPO | Admitting: Emergency Medicine

## 2020-06-24 ENCOUNTER — Encounter: Payer: Self-pay | Admitting: Emergency Medicine

## 2020-06-24 ENCOUNTER — Other Ambulatory Visit: Payer: Self-pay

## 2020-06-24 DIAGNOSIS — I10 Essential (primary) hypertension: Secondary | ICD-10-CM | POA: Diagnosis not present

## 2020-06-24 NOTE — Progress Notes (Signed)
Billy Bray 50 y.o.   Chief Complaint  Patient presents with  . Hypertension    Follow up 6 months    HISTORY OF PRESENT ILLNESS: This is a 50 y.o. male with history of hypertension here for 70-month follow-up. Presently taking amlodipine 5 mg daily.  Has no complaints or medical concerns today. Has had recent problems with his right shoulder.  Seeing orthopedist for this. Saw him last complaining of chronic right ear pain.  Was able to see Dr. Ezzard Standing, ENT doctor.  Evaluation as follows: Assessment: Mild rhinitis.  No clinical evidence of active infection. Right ear pain related to right TMJ dysfunction.   Plan: Concerning the mild rhinitis would recommend use of Flonase or Nasacort which he already has and or saline rinses as needed. Reviewed with him concerning the TMJ pain causing the right ear pain.  Recommended use of NSAIDs and a soft diet should help improve the TMJ pain and would recommend follow-up with his dentist concerning further evaluation and treatment of this.  Narda Bonds, MD   HPI   Prior to Admission medications   Medication Sig Start Date End Date Taking? Authorizing Provider  albuterol (PROVENTIL) (2.5 MG/3ML) 0.083% nebulizer solution Take 3 mLs (2.5 mg total) by nebulization every 6 (six) hours as needed for wheezing or shortness of breath. 04/17/15  Yes Veryl Speak, FNP  albuterol (VENTOLIN HFA) 108 (90 Base) MCG/ACT inhaler Inhale 1-2 puffs into the lungs every 6 (six) hours as needed for wheezing or shortness of breath. 07/06/19  Yes Jamani Eley, Eilleen Kempf, MD  amLODipine (NORVASC) 5 MG tablet TAKE 1 TABLET BY MOUTH EVERY DAY 02/10/20  Yes Loxley Cibrian, Eilleen Kempf, MD  amphetamine-dextroamphetamine (ADDERALL XR) 30 MG 24 hr capsule Take 1 capsule (30 mg total) by mouth daily. 07/25/17  Yes Shambley, Audie Box, NP  amphetamine-dextroamphetamine (ADDERALL) 20 MG tablet Take 1 tablet (20 mg total) by mouth daily as needed. 07/25/17  Yes Shambley, Audie Box, NP   gabapentin (NEURONTIN) 300 MG capsule TAKE ONE CAPSULE IN AM, EARLY AFTERNOON, AND THREE CAPSULES AT BEDTIME. 06/09/20  Yes Shaira Sova, Eilleen Kempf, MD  ketoconazole (NIZORAL) 2 % cream APPLY TO AFFECTED AREA EVERY DAY 08/26/19  Yes Georgina Quint, MD  methylPREDNISolone (MEDROL DOSEPAK) 4 MG TBPK tablet Sig as indicated 03/16/20  Yes Raphaella Larkin, Eilleen Kempf, MD  pantoprazole (PROTONIX) 40 MG tablet Take 1 tablet (40 mg total) by mouth daily. 03/16/20  Yes Elenor Wildes, Eilleen Kempf, MD  triamcinolone (KENALOG) 0.1 % APPLY TO AFFECTED AREA TWICE A DAY 02/06/20  Yes Azya Barbero, Eilleen Kempf, MD  azithromycin [facility]) 250 MG tablet Sig as indicated Patient not taking: Reported on 03/16/2020 02/06/20   Georgina Quint, MD  doxycycline (VIBRA-TABS) 100 MG tablet Take 1 tablet (100 mg total) by mouth 2 (two) times daily. 02/27/20   Georgina Quint, MD    Allergies  Allergen Reactions  . Amoxicillin Hives    Patient Active Problem List   Diagnosis Date Noted  . Essential hypertension 06/27/2018  . Paresthesias 06/27/2018  . Chronic pain syndrome 08/31/2017  . Degeneration of intervertebral disc of cervical region 09/13/2016  . TMJ dysfunction 08/08/2016  . Cervical spondylolysis 08/05/2016  . Polyarthralgia 05/24/2016  . Asthma 09/14/2015  . Secondary male hypogonadism 06/17/2015  . ADD (attention deficit disorder) 09/02/2014  . Neuropathy 09/02/2014    Past Medical History:  Diagnosis Date  . Allergy   . Arthritis   . Asthma   . Depression   . Kidney  stones     Past Surgical History:  Procedure Laterality Date  . BILATERAL CARPAL TUNNEL RELEASE  2004    Social History   Socioeconomic History  . Marital status: Divorced    Spouse name: Not on file  . Number of children: 2  . Years of education: 21  . Highest education level: Not on file  Occupational History  . Occupation: Personnel officer  Tobacco Use  . Smoking status: Former Games developer  . Smokeless tobacco: Never Used   Substance and Sexual Activity  . Alcohol use: No    Alcohol/week: 0.0 standard drinks  . Drug use: Yes    Types: Marijuana  . Sexual activity: Not Currently  Other Topics Concern  . Not on file  Social History Narrative   Fun: Entertainment sounds/lights for concerts,   Denies religious beliefs effecting health care.    Social Determinants of Health   Financial Resource Strain: Not on file  Food Insecurity: Not on file  Transportation Needs: Not on file  Physical Activity: Not on file  Stress: Not on file  Social Connections: Not on file  Intimate Partner Violence: Not on file    Family History  Problem Relation Age of Onset  . Healthy Mother   . Healthy Father   . Stroke Maternal Grandmother   . Diabetes Maternal Grandmother   . Stroke Maternal Grandfather   . Diabetes Maternal Grandfather   . Colon cancer Paternal Grandfather      Review of Systems  Constitutional: Negative.  Negative for chills and fever.  HENT: Negative.  Negative for congestion and sore throat.   Respiratory: Negative.  Negative for cough and shortness of breath.   Cardiovascular: Negative.  Negative for chest pain and palpitations.  Gastrointestinal: Negative.  Negative for abdominal pain, diarrhea, nausea and vomiting.  Genitourinary: Negative.  Negative for dysuria and hematuria.  Skin: Negative.  Negative for rash.  Neurological: Negative.  Negative for dizziness and headaches.  All other systems reviewed and are negative.   Today's Vitals   06/24/20 1603  BP: 136/88  Pulse: 95  Temp: 98 F (36.7 C)  TempSrc: Oral  SpO2: 97%  Weight: 225 lb (102.1 kg)  Height: 5\' 11"  (1.803 m)   Body mass index is 31.38 kg/m. BP Readings from Last 3 Encounters:  06/24/20 136/88  03/16/20 122/80  12/25/19 133/86   Wt Readings from Last 3 Encounters:  06/24/20 225 lb (102.1 kg)  03/16/20 233 lb (105.7 kg)  02/27/20 237 lb (107.5 kg)    Physical Exam Vitals reviewed.  Constitutional:       Appearance: Normal appearance.  HENT:     Head: Normocephalic.  Eyes:     Extraocular Movements: Extraocular movements intact.     Conjunctiva/sclera: Conjunctivae normal.     Pupils: Pupils are equal, round, and reactive to light.  Cardiovascular:     Rate and Rhythm: Normal rate and regular rhythm.     Pulses: Normal pulses.     Heart sounds: Normal heart sounds.  Pulmonary:     Effort: Pulmonary effort is normal.     Breath sounds: Normal breath sounds.  Musculoskeletal:     Cervical back: Normal range of motion and neck supple.  Skin:    General: Skin is warm and dry.     Capillary Refill: Capillary refill takes less than 2 seconds.  Neurological:     General: No focal deficit present.     Mental Status: He is alert and oriented to person,  place, and time.  Psychiatric:        Mood and Affect: Mood normal.        Behavior: Behavior normal.      ASSESSMENT & PLAN: Essential hypertension Well-controlled hypertension.  Continue amlodipine 5 mg daily. Diet and nutrition discussed. Follow-up in 6 months.  Okie was seen today for hypertension.  Diagnoses and all orders for this visit:  Essential hypertension    Patient Instructions   Hypertension, Adult High blood pressure (hypertension) is when the force of blood pumping through the arteries is too strong. The arteries are the blood vessels that carry blood from the heart throughout the body. Hypertension forces the heart to work harder to pump blood and may cause arteries to become narrow or stiff. Untreated or uncontrolled hypertension can cause a heart attack, heart failure, a stroke, kidney disease, and other problems. A blood pressure reading consists of a higher number over a lower number. Ideally, your blood pressure should be below 120/80. The first ("top") number is called the systolic pressure. It is a measure of the pressure in your arteries as your heart beats. The second ("bottom") number is called the  diastolic pressure. It is a measure of the pressure in your arteries as the heart relaxes. What are the causes? The exact cause of this condition is not known. There are some conditions that result in or are related to high blood pressure. What increases the risk? Some risk factors for high blood pressure are under your control. The following factors may make you more likely to develop this condition:  Smoking.  Having type 2 diabetes mellitus, high cholesterol, or both.  Not getting enough exercise or physical activity.  Being overweight.  Having too much fat, sugar, calories, or salt (sodium) in your diet.  Drinking too much alcohol. Some risk factors for high blood pressure may be difficult or impossible to change. Some of these factors include:  Having chronic kidney disease.  Having a family history of high blood pressure.  Age. Risk increases with age.  Race. You may be at higher risk if you are African American.  Gender. Men are at higher risk than women before age 66. After age 41, women are at higher risk than men.  Having obstructive sleep apnea.  Stress. What are the signs or symptoms? High blood pressure may not cause symptoms. Very high blood pressure (hypertensive crisis) may cause:  Headache.  Anxiety.  Shortness of breath.  Nosebleed.  Nausea and vomiting.  Vision changes.  Severe chest pain.  Seizures. How is this diagnosed? This condition is diagnosed by measuring your blood pressure while you are seated, with your arm resting on a flat surface, your legs uncrossed, and your feet flat on the floor. The cuff of the blood pressure monitor will be placed directly against the skin of your upper arm at the level of your heart. It should be measured at least twice using the same arm. Certain conditions can cause a difference in blood pressure between your right and left arms. Certain factors can cause blood pressure readings to be lower or higher than  normal for a short period of time:  When your blood pressure is higher when you are in a health care provider's office than when you are at home, this is called white coat hypertension. Most people with this condition do not need medicines.  When your blood pressure is higher at home than when you are in a health care provider's office, this  is called masked hypertension. Most people with this condition may need medicines to control blood pressure. If you have a high blood pressure reading during one visit or you have normal blood pressure with other risk factors, you may be asked to:  Return on a different day to have your blood pressure checked again.  Monitor your blood pressure at home for 1 week or longer. If you are diagnosed with hypertension, you may have other blood or imaging tests to help your health care provider understand your overall risk for other conditions. How is this treated? This condition is treated by making healthy lifestyle changes, such as eating healthy foods, exercising more, and reducing your alcohol intake. Your health care provider may prescribe medicine if lifestyle changes are not enough to get your blood pressure under control, and if:  Your systolic blood pressure is above 130.  Your diastolic blood pressure is above 80. Your personal target blood pressure may vary depending on your medical conditions, your age, and other factors. Follow these instructions at home: Eating and drinking  Eat a diet that is high in fiber and potassium, and low in sodium, added sugar, and fat. An example eating plan is called the DASH (Dietary Approaches to Stop Hypertension) diet. To eat this way: ? Eat plenty of fresh fruits and vegetables. Try to fill one half of your plate at each meal with fruits and vegetables. ? Eat whole grains, such as whole-wheat pasta, brown rice, or whole-grain bread. Fill about one fourth of your plate with whole grains. ? Eat or drink low-fat dairy  products, such as skim milk or low-fat yogurt. ? Avoid fatty cuts of meat, processed or cured meats, and poultry with skin. Fill about one fourth of your plate with lean proteins, such as fish, chicken without skin, beans, eggs, or tofu. ? Avoid pre-made and processed foods. These tend to be higher in sodium, added sugar, and fat.  Reduce your daily sodium intake. Most people with hypertension should eat less than 1,500 mg of sodium a day.  Do not drink alcohol if: ? Your health care provider tells you not to drink. ? You are pregnant, may be pregnant, or are planning to become pregnant.  If you drink alcohol: ? Limit how much you use to:  0-1 drink a day for women.  0-2 drinks a day for men. ? Be aware of how much alcohol is in your drink. In the [place] one drink equals one 12 oz bottle of beer (355 mL), one 5 oz glass of wine (148 mL), or one 1 oz glass of hard liquor (44 mL).   Lifestyle  Work with your health care provider to maintain a healthy body weight or to lose weight. Ask what an ideal weight is for you.  Get at least 30 minutes of exercise most days of the week. Activities may include walking, swimming, or biking.  Include exercise to strengthen your muscles (resistance exercise), such as Pilates or lifting weights, as part of your weekly exercise routine. Try to do these types of exercises for 30 minutes at least 3 days a week.  Do not use any products that contain nicotine or tobacco, such as cigarettes, e-cigarettes, and chewing tobacco. If you need help quitting, ask your health care provider.  Monitor your blood pressure at home as told by your health care provider.  Keep all follow-up visits as told by your health care provider. This is important.   Medicines  Take over-the-counter and prescription  medicines only as told by your health care provider. Follow directions carefully. Blood pressure medicines must be taken as prescribed.  Do not skip doses of blood  pressure medicine. Doing this puts you at risk for problems and can make the medicine less effective.  Ask your health care provider about side effects or reactions to medicines that you should watch for. Contact a health care provider if you:  Think you are having a reaction to a medicine you are taking.  Have headaches that keep coming back (recurring).  Feel dizzy.  Have swelling in your ankles.  Have trouble with your vision. Get help right away if you:  Develop a severe headache or confusion.  Have unusual weakness or numbness.  Feel faint.  Have severe pain in your chest or abdomen.  Vomit repeatedly.  Have trouble breathing. Summary  Hypertension is when the force of blood pumping through your arteries is too strong. If this condition is not controlled, it may put you at risk for serious complications.  Your personal target blood pressure may vary depending on your medical conditions, your age, and other factors. For most people, a normal blood pressure is less than 120/80.  Hypertension is treated with lifestyle changes, medicines, or a combination of both. Lifestyle changes include losing weight, eating a healthy, low-sodium diet, exercising more, and limiting alcohol. This information is not intended to replace advice given to you by your health care provider. Make sure you discuss any questions you have with your health care provider. Document Revised: 09/20/2017 Document Reviewed: 09/20/2017 Elsevier Patient Education  2021 Elsevier Inc.      Edwina BarthMiguel Lark Runk, MD [facility]

## 2020-06-24 NOTE — Assessment & Plan Note (Signed)
Well-controlled hypertension.  Continue amlodipine 5 mg daily. Diet and nutrition discussed. Follow-up in 6 months. 

## 2020-06-24 NOTE — Patient Instructions (Signed)

## 2020-07-08 ENCOUNTER — Other Ambulatory Visit: Payer: Self-pay | Admitting: Emergency Medicine

## 2020-07-08 DIAGNOSIS — R21 Rash and other nonspecific skin eruption: Secondary | ICD-10-CM

## 2020-07-09 ENCOUNTER — Other Ambulatory Visit: Payer: Self-pay | Admitting: Emergency Medicine

## 2020-07-09 ENCOUNTER — Other Ambulatory Visit: Payer: Self-pay | Admitting: *Deleted

## 2020-07-09 DIAGNOSIS — R21 Rash and other nonspecific skin eruption: Secondary | ICD-10-CM

## 2020-07-09 DIAGNOSIS — R12 Heartburn: Secondary | ICD-10-CM

## 2020-07-09 MED ORDER — TRIAMCINOLONE ACETONIDE 0.1 % EX CREA: TOPICAL_CREAM | CUTANEOUS | 1 refills | Status: AC

## 2020-07-09 NOTE — Telephone Encounter (Signed)
Medication refilled to [facility].

## 2020-08-07 ENCOUNTER — Other Ambulatory Visit: Payer: Self-pay | Admitting: Emergency Medicine

## 2020-08-07 DIAGNOSIS — I10 Essential (primary) hypertension: Secondary | ICD-10-CM

## 2020-08-27 DIAGNOSIS — Z79899 Other long term (current) drug therapy: Secondary | ICD-10-CM | POA: Diagnosis not present

## 2020-08-27 DIAGNOSIS — F902 Attention-deficit hyperactivity disorder, combined type: Secondary | ICD-10-CM | POA: Diagnosis not present

## 2020-08-31 ENCOUNTER — Other Ambulatory Visit: Payer: Self-pay | Admitting: Emergency Medicine

## 2020-08-31 ENCOUNTER — Encounter: Payer: Self-pay | Admitting: Emergency Medicine

## 2020-08-31 DIAGNOSIS — G894 Chronic pain syndrome: Secondary | ICD-10-CM

## 2020-08-31 MED ORDER — GABAPENTIN 300 MG PO CAPS: ORAL_CAPSULE | ORAL | 0 refills | Status: DC

## 2020-08-31 NOTE — Telephone Encounter (Signed)
7-day prescription sent to pharmacy of record.  Thanks.

## 2020-10-09 ENCOUNTER — Other Ambulatory Visit: Payer: Self-pay | Admitting: Emergency Medicine

## 2020-10-09 DIAGNOSIS — G894 Chronic pain syndrome: Secondary | ICD-10-CM

## 2020-12-03 ENCOUNTER — Encounter: Payer: Self-pay | Admitting: Emergency Medicine

## 2020-12-03 ENCOUNTER — Other Ambulatory Visit: Payer: Self-pay

## 2020-12-03 ENCOUNTER — Ambulatory Visit: Payer: BC Managed Care – PPO | Admitting: Emergency Medicine

## 2020-12-03 VITALS — BP 136/82 | HR 86 | Temp 98.6°F | Ht 71.0 in | Wt 218.0 lb

## 2020-12-03 DIAGNOSIS — I10 Essential (primary) hypertension: Secondary | ICD-10-CM | POA: Diagnosis not present

## 2020-12-03 DIAGNOSIS — R202 Paresthesia of skin: Secondary | ICD-10-CM

## 2020-12-03 DIAGNOSIS — Z1211 Encounter for screening for malignant neoplasm of colon: Secondary | ICD-10-CM | POA: Diagnosis not present

## 2020-12-03 DIAGNOSIS — Z23 Encounter for immunization: Secondary | ICD-10-CM

## 2020-12-03 NOTE — Assessment & Plan Note (Signed)
Well-controlled hypertension.  Continue amlodipine 5 mg daily. BP Readings from Last 3 Encounters:  12/03/20 136/82  06/24/20 136/88  03/16/20 122/80  Dietary approaches to stop hypertension discussed.

## 2020-12-03 NOTE — Progress Notes (Signed)
Billy Bray 50 y.o.   Chief Complaint  Patient presents with   Hypertension    6 month f/u. Pt states he is experiencing tingling and numbing in left big toe, happens at night. Pt also state right calf hurts at times    HISTORY OF PRESENT ILLNESS: This is a 50 y.o. male with history of hypertension here for follow-up. Also complaining of intermittent tingling and numbing in left big toe mostly at night. Also has intermittent pain to right calf. No other complaints or medical concerns today. BP Readings from Last 3 Encounters:  12/03/20 136/82  06/24/20 136/88  03/16/20 122/80     Hypertension Pertinent negatives include no chest pain, headaches, palpitations or shortness of breath.    Prior to Admission medications   Medication Sig Start Date End Date Taking? Authorizing Provider  albuterol (PROVENTIL) (2.5 MG/3ML) 0.083% nebulizer solution Take 3 mLs (2.5 mg total) by nebulization every 6 (six) hours as needed for wheezing or shortness of breath. 04/17/15  Yes Veryl Speak, FNP  albuterol (VENTOLIN HFA) 108 (90 Base) MCG/ACT inhaler Inhale 1-2 puffs into the lungs every 6 (six) hours as needed for wheezing or shortness of breath. 07/06/19  Yes Jenni Thew, Eilleen Kempf, MD  amLODipine (NORVASC) 5 MG tablet TAKE 1 TABLET BY MOUTH EVERY DAY 08/07/20  Yes Josilynn Losh, Eilleen Kempf, MD  amphetamine-dextroamphetamine (ADDERALL XR) 30 MG 24 hr capsule Take 1 capsule (30 mg total) by mouth daily. 07/25/17  Yes Shambley, Audie Box, NP  amphetamine-dextroamphetamine (ADDERALL) 20 MG tablet Take 1 tablet (20 mg total) by mouth daily as needed. 07/25/17  Yes Evaristo Bury, NP  gabapentin (NEURONTIN) 300 MG capsule TAKE ONE CAPSULE IN AM, EARLY AFTERNOON, AND THREE CAPSULES AT BEDTIME. 10/09/20  Yes Simranjit Thayer, Eilleen Kempf, MD  ketoconazole (NIZORAL) 2 % cream APPLY TO AFFECTED AREA EVERY DAY 08/26/19  Yes Georgina Quint, MD  methylPREDNISolone (MEDROL DOSEPAK) 4 MG TBPK tablet Sig as  indicated 03/16/20  Yes Kimeka Badour, Eilleen Kempf, MD  pantoprazole (PROTONIX) 40 MG tablet TAKE 1 TABLET BY MOUTH EVERY DAY 07/09/20  Yes Ikhlas Albo, Eilleen Kempf, MD  triamcinolone cream (KENALOG) 0.1 % APPLY TO AFFECTED AREA TWICE A DAY 07/09/20  Yes Georgina Quint, MD    Allergies  Allergen Reactions   Amoxicillin Hives    Patient Active Problem List   Diagnosis Date Noted   Essential hypertension 06/27/2018   Paresthesias 06/27/2018   Chronic pain syndrome 08/31/2017   Degeneration of intervertebral disc of cervical region 09/13/2016   TMJ dysfunction 08/08/2016   Cervical spondylolysis 08/05/2016   Polyarthralgia 05/24/2016   Asthma 09/14/2015   Secondary male hypogonadism 06/17/2015   ADD (attention deficit disorder) 09/02/2014   Neuropathy 09/02/2014    Past Medical History:  Diagnosis Date   Allergy    Arthritis    Asthma    Depression    Kidney stones     Past Surgical History:  Procedure Laterality Date   BILATERAL CARPAL TUNNEL RELEASE  2004    Social History   Socioeconomic History   Marital status: Divorced    Spouse name: Not on file   Number of children: 2   Years of education: 14   Highest education level: Not on file  Occupational History   Occupation: Personnel officer  Tobacco Use   Smoking status: Former   Smokeless tobacco: Never  Substance and Sexual Activity   Alcohol use: No    Alcohol/week: 0.0 standard drinks   Drug use: Yes  Types: Marijuana   Sexual activity: Not Currently  Other Topics Concern   Not on file  Social History Narrative   Fun: Entertainment sounds/lights for concerts,   Denies religious beliefs effecting health care.    Social Determinants of Health   Financial Resource Strain: Not on file  Food Insecurity: Not on file  Transportation Needs: Not on file  Physical Activity: Not on file  Stress: Not on file  Social Connections: Not on file  Intimate Partner Violence: Not on file    Family History  Problem  Relation Age of Onset   Healthy Mother    Healthy Father    Stroke Maternal Grandmother    Diabetes Maternal Grandmother    Stroke Maternal Grandfather    Diabetes Maternal Grandfather    Colon cancer Paternal Grandfather      Review of Systems  Constitutional: Negative.  Negative for chills and fever.  HENT: Negative.  Negative for congestion and sore throat.   Respiratory: Negative.  Negative for cough and shortness of breath.   Cardiovascular: Negative.  Negative for chest pain and palpitations.  Gastrointestinal:  Negative for abdominal pain, nausea and vomiting.  Genitourinary: Negative.   Skin: Negative.  Negative for rash.  Neurological:  Negative for dizziness and headaches.  All other systems reviewed and are negative.   Physical Exam Vitals reviewed.  Constitutional:      Appearance: Normal appearance.  HENT:     Head: Normocephalic.     Mouth/Throat:     Mouth: Mucous membranes are moist.     Pharynx: Oropharynx is clear.  Eyes:     Extraocular Movements: Extraocular movements intact.     Conjunctiva/sclera: Conjunctivae normal.     Pupils: Pupils are equal, round, and reactive to light.  Cardiovascular:     Rate and Rhythm: Normal rate and regular rhythm.     Pulses: Normal pulses.     Heart sounds: Normal heart sounds.  Pulmonary:     Effort: Pulmonary effort is normal.     Breath sounds: Normal breath sounds.  Abdominal:     General: There is no distension.     Palpations: Abdomen is soft.     Tenderness: There is no abdominal tenderness.  Musculoskeletal:     Cervical back: Normal range of motion and neck supple.     Comments: Lower extremities: No signs of DVT.  No erythema or swelling.  Excellent peripheral circulation.  Good color.  Warm to touch.  No signs of chronic venous insufficiency.  Skin:    General: Skin is warm and dry.     Capillary Refill: Capillary refill takes less than 2 seconds.  Neurological:     General: No focal deficit  present.     Mental Status: He is alert and oriented to person, place, and time.  Psychiatric:        Mood and Affect: Mood normal.        Behavior: Behavior normal.     ASSESSMENT & PLAN: Problem List Items Addressed This Visit       Cardiovascular and Mediastinum   Essential hypertension - Primary    Well-controlled hypertension.  Continue amlodipine 5 mg daily. BP Readings from Last 3 Encounters:  12/03/20 136/82  06/24/20 136/88  03/16/20 122/80  Dietary approaches to stop hypertension discussed.       Other Visit Diagnoses     Colon cancer screening       Relevant Orders   Ambulatory referral to Gastroenterology  Paresthesia of left foot          Patient Instructions  Health Maintenance, Male Adopting a healthy lifestyle and getting preventive care are important in promoting health and wellness. Ask your health care provider about: The right schedule for you to have regular tests and exams. Things you can do on your own to prevent diseases and keep yourself healthy. What should I know about diet, weight, and exercise? Eat a healthy diet  Eat a diet that includes plenty of vegetables, fruits, low-fat dairy products, and lean protein. Do not eat a lot of foods that are high in solid fats, added sugars, or sodium. Maintain a healthy weight Body mass index (BMI) is a measurement that can be used to identify possible weight problems. It estimates body fat based on height and weight. Your health care provider can help determine your BMI and help you achieve or maintain a healthy weight. Get regular exercise Get regular exercise. This is one of the most important things you can do for your health. Most adults should: Exercise for at least 150 minutes each week. The exercise should increase your heart rate and make you sweat (moderate-intensity exercise). Do strengthening exercises at least twice a week. This is in addition to the moderate-intensity exercise. Spend less  time sitting. Even light physical activity can be beneficial. Watch cholesterol and blood lipids Have your blood tested for lipids and cholesterol at 49 years of age, then have this test every 5 years. You may need to have your cholesterol levels checked more often if: Your lipid or cholesterol levels are high. You are older than 49 years of age. You are at high risk for heart disease. What should I know about cancer screening? Many types of cancers can be detected early and may often be prevented. Depending on your health history and family history, you may need to have cancer screening at various ages. This may include screening for: Colorectal cancer. Prostate cancer. Skin cancer. Lung cancer. What should I know about heart disease, diabetes, and high blood pressure? Blood pressure and heart disease High blood pressure causes heart disease and increases the risk of stroke. This is more likely to develop in people who have high blood pressure readings or are overweight. Talk with your health care provider about your target blood pressure readings. Have your blood pressure checked: Every 3-5 years if you are 82-53 years of age. Every year if you are 53 years old or older. If you are between the ages of 81 and 4 and are a current or former smoker, ask your health care provider if you should have a one-time screening for abdominal aortic aneurysm (AAA). Diabetes Have regular diabetes screenings. This checks your fasting blood sugar level. Have the screening done: Once every three years after age 50 if you are at a normal weight and have a low risk for diabetes. More often and at a younger age if you are overweight or have a high risk for diabetes. What should I know about preventing infection? Hepatitis B If you have a higher risk for hepatitis B, you should be screened for this virus. Talk with your health care provider to find out if you are at risk for hepatitis B infection. Hepatitis  C Blood testing is recommended for: Everyone born from [birth year] through 1964. Anyone with known risk factors for hepatitis C. Sexually transmitted infections (STIs) You should be screened each year for STIs, including gonorrhea and chlamydia, if: You are sexually active and  are younger than 49 years of age. You are older than 49 years of age and your health care provider tells you that you are at risk for this type of infection. Your sexual activity has changed since you were last screened, and you are at increased risk for chlamydia or gonorrhea. Ask your health care provider if you are at risk. Ask your health care provider about whether you are at high risk for HIV. Your health care provider may recommend a prescription medicine to help prevent HIV infection. If you choose to take medicine to prevent HIV, you should first get tested for HIV. You should then be tested every 3 months for as long as you are taking the medicine. Follow these instructions at home: Alcohol use Do not drink alcohol if your health care provider tells you not to drink. If you drink alcohol: Limit how much you have to 0-2 drinks a day. Know how much alcohol is in your drink. In the [place] one drink equals one 12 oz bottle of beer (355 mL), one 5 oz glass of wine (148 mL), or one 1 oz glass of hard liquor (44 mL). Lifestyle Do not use any products that contain nicotine or tobacco. These products include cigarettes, chewing tobacco, and vaping devices, such as e-cigarettes. If you need help quitting, ask your health care provider. Do not use street drugs. Do not share needles. Ask your health care provider for help if you need support or information about quitting drugs. General instructions Schedule regular health, dental, and eye exams. Stay current with your vaccines. Tell your health care provider if: You often feel depressed. You have ever been abused or do not feel safe at home. Summary Adopting a healthy  lifestyle and getting preventive care are important in promoting health and wellness. Follow your health care provider's instructions about healthy diet, exercising, and getting tested or screened for diseases. Follow your health care provider's instructions on monitoring your cholesterol and blood pressure. This information is not intended to replace advice given to you by your health care provider. Make sure you discuss any questions you have with your health care provider. Document Revised: 06/01/2020 Document Reviewed: 06/01/2020 Elsevier Patient Education  2022 Elsevier Inc.    Edwina Barth, MD [facility]

## 2020-12-03 NOTE — Patient Instructions (Signed)

## 2020-12-24 ENCOUNTER — Ambulatory Visit: Payer: BC Managed Care – PPO | Admitting: Emergency Medicine

## 2021-01-01 ENCOUNTER — Other Ambulatory Visit: Payer: Self-pay | Admitting: Emergency Medicine

## 2021-01-01 DIAGNOSIS — R12 Heartburn: Secondary | ICD-10-CM

## 2021-01-05 ENCOUNTER — Other Ambulatory Visit: Payer: Self-pay | Admitting: Emergency Medicine

## 2021-01-05 DIAGNOSIS — I10 Essential (primary) hypertension: Secondary | ICD-10-CM

## 2021-02-03 DIAGNOSIS — Z79899 Other long term (current) drug therapy: Secondary | ICD-10-CM | POA: Diagnosis not present

## 2021-02-03 DIAGNOSIS — F902 Attention-deficit hyperactivity disorder, combined type: Secondary | ICD-10-CM | POA: Diagnosis not present

## 2021-05-17 ENCOUNTER — Other Ambulatory Visit: Payer: Self-pay | Admitting: Emergency Medicine

## 2021-05-17 DIAGNOSIS — G894 Chronic pain syndrome: Secondary | ICD-10-CM

## 2021-06-26 ENCOUNTER — Other Ambulatory Visit: Payer: Self-pay | Admitting: Emergency Medicine

## 2021-06-26 DIAGNOSIS — R12 Heartburn: Secondary | ICD-10-CM

## 2021-07-19 ENCOUNTER — Other Ambulatory Visit: Payer: Self-pay | Admitting: Emergency Medicine

## 2021-07-19 DIAGNOSIS — I10 Essential (primary) hypertension: Secondary | ICD-10-CM

## 2021-10-11 ENCOUNTER — Ambulatory Visit (HOSPITAL_COMMUNITY)
Admission: EM | Admit: 2021-10-11 | Discharge: 2021-10-11 | Disposition: A | Discharge status: Home / Self Care | Payer: No Typology Code available for payment source | Attending: Emergency Medicine | Admitting: Emergency Medicine

## 2021-10-11 DIAGNOSIS — Z973 Presence of spectacles and contact lenses: Secondary | ICD-10-CM | POA: Diagnosis not present

## 2021-10-11 DIAGNOSIS — R0981 Nasal congestion: Secondary | ICD-10-CM

## 2021-10-11 DIAGNOSIS — H5789 Other specified disorders of eye and adnexa: Secondary | ICD-10-CM | POA: Diagnosis not present

## 2021-10-11 DIAGNOSIS — T1592XA Foreign body on external eye, part unspecified, left eye, initial encounter: Secondary | ICD-10-CM | POA: Diagnosis not present

## 2021-10-11 MED ORDER — FLUORESCEIN SODIUM 1 MG OP STRP
ORAL_STRIP | OPHTHALMIC | Status: AC
  Filled 2021-10-11: qty 1

## 2021-10-11 MED ORDER — CIPROFLOXACIN HCL 0.3 % OP SOLN: OPHTHALMIC | 0 refills | Status: DC

## 2021-10-11 MED ORDER — METHYLPREDNISOLONE 4 MG PO TBPK: ORAL_TABLET | ORAL | 1 refills | Status: DC

## 2021-10-11 MED ORDER — EYE WASH OP SOLN
OPHTHALMIC | Status: AC
  Filled 2021-10-11: qty 118

## 2021-10-11 MED ORDER — TETRACAINE HCL 0.5 % OP SOLN
OPHTHALMIC | Status: AC
  Filled 2021-10-11: qty 4

## 2021-10-11 NOTE — ED Provider Notes (Signed)
[facility]    CSN: [id number] Arrival date & time: 10/11/21  Billy Bray      History   Chief Complaint Chief Complaint  Patient presents with   Eye Problem    HPI Billy Bray is a 51 y.o. male.  Presents with left eye foreign body sensation. Reports he was at work, working in a ceiling and something fell into his eye.  He was wearing his protective goggles but felt a foreign body sensation and saw a small white speck over the pupil.  He rinsed the eye out with saline but foreign body sensation persisted. Eye is irritated. Denies vision changes  He does wear contacts, was not wearing left eye contact when this occurred  Past Medical History:  Diagnosis Date   Allergy    Arthritis    Asthma    Depression    Kidney stones     Patient Active Problem List   Diagnosis Date Noted   Essential hypertension 06/27/2018   Paresthesias 06/27/2018   Chronic pain syndrome 08/31/2017   Degeneration of intervertebral disc of cervical region 09/13/2016   TMJ dysfunction 08/08/2016   Cervical spondylolysis 08/05/2016   Polyarthralgia 05/24/2016   Asthma 09/14/2015   Secondary male hypogonadism 06/17/2015   ADD (attention deficit disorder) 09/02/2014   Neuropathy 09/02/2014    Past Surgical History:  Procedure Laterality Date   BILATERAL CARPAL TUNNEL RELEASE  2004       Home Medications    Prior to Admission medications   Medication Sig Start Date End Date Taking? Authorizing Provider  albuterol (PROVENTIL) (2.5 MG/3ML) 0.083% nebulizer solution Take 3 mLs (2.5 mg total) by nebulization every 6 (six) hours as needed for wheezing or shortness of breath. 04/17/15   Golden Circle, FNP  albuterol (VENTOLIN HFA) 108 (90 Base) MCG/ACT inhaler Inhale 1-2 puffs into the lungs every 6 (six) hours as needed for wheezing or shortness of breath. 07/06/19   Horald Pollen, MD  amLODipine (NORVASC) 5 MG tablet TAKE 1 TABLET BY MOUTH EVERY DAY 07/19/21   Horald Pollen, MD  amphetamine-dextroamphetamine (ADDERALL XR) 30 MG 24 hr capsule Take 1 capsule (30 mg total) by mouth daily. 07/25/17   Dragnev, Caesar Chestnut, NP  amphetamine-dextroamphetamine (ADDERALL) 20 MG tablet Take 1 tablet (20 mg total) by mouth daily as needed. 07/25/17   Dragnev, Caesar Chestnut, NP  ciprofloxacin (CILOXAN) 0.3 % ophthalmic solution Administer 1 drop, every 2 hours, while awake, for 2 days. Then 1 drop, every 4 hours, while awake, for the next 5 days. 10/11/21   Taquana Bartley, Wells Guiles, PA-C  gabapentin (NEURONTIN) 300 MG capsule TAKE ONE CAPSULE IN AM, EARLY AFTERNOON, AND THREE CAPSULES AT BEDTIME. 05/18/21   Horald Pollen, MD  ketoconazole (NIZORAL) 2 % cream APPLY TO AFFECTED AREA EVERY DAY 08/26/19   Horald Pollen, MD  pantoprazole (PROTONIX) 40 MG tablet TAKE 1 TABLET BY MOUTH EVERY DAY 06/26/21   Horald Pollen, MD  triamcinolone cream (KENALOG) 0.1 % APPLY TO AFFECTED AREA TWICE A DAY 07/09/20   Horald Pollen, MD    Family History Family History  Problem Relation Age of Onset   Healthy Mother    Healthy Father    Stroke Maternal Grandmother    Diabetes Maternal Grandmother    Stroke Maternal Grandfather    Diabetes Maternal Grandfather    Colon cancer Paternal Grandfather     Social History Social History   Tobacco Use   Smoking status: Former  Smokeless tobacco: Never  Substance Use Topics   Alcohol use: No    Alcohol/week: 0.0 standard drinks of alcohol   Drug use: Yes    Types: Marijuana     Allergies   Amoxicillin   Review of Systems Review of Systems Per HPI  Physical Exam Triage Vital Signs ED Triage Vitals [10/11/21 1941]  Enc Vitals Group     BP (!) 138/94     Pulse Rate 91     Resp 18     Temp 98.3 F (36.8 C)     Temp Source Oral     SpO2 97 %     Weight      Height      Head Circumference      Peak Flow      Pain Score      Pain Loc      Pain Edu?      Excl. in GC?    No data found.  Updated  Vital Signs BP (!) 138/94 (BP Location: Left Arm)   Pulse 91   Temp 98.3 F (36.8 C) (Oral)   Resp 18   SpO2 97%    Physical Exam Vitals and nursing note reviewed.  HENT:     Mouth/Throat:     Pharynx: Oropharynx is clear.  Eyes:     General: Lids are normal. Lids are everted, no foreign bodies appreciated. Vision grossly intact.        Left eye: No foreign body or discharge.     Extraocular Movements: Extraocular movements intact.     Conjunctiva/sclera:     Left eye: Left conjunctiva is not injected.     Pupils: Pupils are equal, round, and reactive to light.     Comments: No fluorescein uptake noted on Woods lamp exam.  No foreign body noted in the eye or eyelids  Cardiovascular:     Rate and Rhythm: Normal rate and regular rhythm.     Pulses: Normal pulses.     Heart sounds: Normal heart sounds.  Pulmonary:     Effort: Pulmonary effort is normal.     Breath sounds: Normal breath sounds.  Neurological:     Mental Status: He is alert and oriented to person, place, and time.     UC Treatments / Results  Labs (all labs ordered are listed, but only abnormal results are displayed) Labs Reviewed - No data to display  EKG  Radiology No results found.  Procedures Procedures   Medications Ordered in UC Medications - No data to display  Initial Impression / Assessment and Plan / UC Course  I have reviewed the triage vital signs and the nursing notes.  Pertinent labs & imaging results that were available during my care of the patient were reviewed by me and considered in my medical decision making (see chart for details).  I did not visualize foreign body on initial exam or with Woods lamp.  Likely patient had rinsed out the speck he saw with his saline wash. At this time we will try ciprofloxacin eyedrops as patient wears contacts.  He does have an eye specialist he can follow-up with as needed, discussed urgent care and ED return precautions as well.  Patient agrees  with plan  Final Clinical Impressions(s) / UC Diagnoses   Final diagnoses:  Eye foreign body, left, initial encounter  Wears contact lenses  Eye irritation     Discharge Instructions      I recommend to use the eye drops  as prescribed.  Please return to urgent care or follow up with eye specialist if symptoms persist.    ED Prescriptions     Medication Sig Dispense Auth. Provider               ciprofloxacin (CILOXAN) 0.3 % ophthalmic solution Administer 1 drop, every 2 hours, while awake, for 2 days. Then 1 drop, every 4 hours, while awake, for the next 5 days. 5 mL Shanyah Gattuso, Lurena Joiner, PA-C      PDMP not reviewed this encounter.   [facility], Lurena Joiner, [place] 10/12/21 [phone]

## 2021-10-11 NOTE — Discharge Instructions (Signed)
I recommend to use the eye drops as prescribed.  Please return to urgent care or follow up with eye specialist if symptoms persist.

## 2021-10-11 NOTE — ED Triage Notes (Signed)
Pt reports something is in his eye.

## 2021-10-11 NOTE — ED Triage Notes (Signed)
Pt reports red, itchy eyes.

## 2021-10-24 ENCOUNTER — Other Ambulatory Visit: Payer: Self-pay | Admitting: Emergency Medicine

## 2021-10-24 DIAGNOSIS — G894 Chronic pain syndrome: Secondary | ICD-10-CM

## 2022-01-09 ENCOUNTER — Other Ambulatory Visit: Payer: Self-pay | Admitting: Emergency Medicine

## 2022-01-09 DIAGNOSIS — I10 Essential (primary) hypertension: Secondary | ICD-10-CM

## 2022-01-09 DIAGNOSIS — R12 Heartburn: Secondary | ICD-10-CM

## 2022-03-31 ENCOUNTER — Other Ambulatory Visit: Payer: Self-pay | Admitting: Emergency Medicine

## 2022-03-31 DIAGNOSIS — G894 Chronic pain syndrome: Secondary | ICD-10-CM

## 2022-05-16 ENCOUNTER — Ambulatory Visit (INDEPENDENT_AMBULATORY_CARE_PROVIDER_SITE_OTHER): Payer: 59 | Admitting: Emergency Medicine

## 2022-05-16 ENCOUNTER — Encounter: Payer: Self-pay | Admitting: Emergency Medicine

## 2022-05-16 ENCOUNTER — Ambulatory Visit (INDEPENDENT_AMBULATORY_CARE_PROVIDER_SITE_OTHER): Payer: 59

## 2022-05-16 VITALS — BP 122/84 | HR 80 | Temp 98.2°F | Ht 71.0 in | Wt 226.0 lb

## 2022-05-16 DIAGNOSIS — Z13228 Encounter for screening for other metabolic disorders: Secondary | ICD-10-CM | POA: Diagnosis not present

## 2022-05-16 DIAGNOSIS — G894 Chronic pain syndrome: Secondary | ICD-10-CM | POA: Diagnosis not present

## 2022-05-16 DIAGNOSIS — Z114 Encounter for screening for human immunodeficiency virus [HIV]: Secondary | ICD-10-CM

## 2022-05-16 DIAGNOSIS — J4520 Mild intermittent asthma, uncomplicated: Secondary | ICD-10-CM

## 2022-05-16 DIAGNOSIS — Z1159 Encounter for screening for other viral diseases: Secondary | ICD-10-CM | POA: Diagnosis not present

## 2022-05-16 DIAGNOSIS — M2550 Pain in unspecified joint: Secondary | ICD-10-CM

## 2022-05-16 DIAGNOSIS — I10 Essential (primary) hypertension: Secondary | ICD-10-CM | POA: Diagnosis not present

## 2022-05-16 DIAGNOSIS — Z130 Encounter for screening for diseases of the blood and blood-forming organs and certain disorders involving the immune mechanism: Secondary | ICD-10-CM | POA: Diagnosis not present

## 2022-05-16 DIAGNOSIS — R079 Chest pain, unspecified: Secondary | ICD-10-CM | POA: Diagnosis not present

## 2022-05-16 DIAGNOSIS — Z1211 Encounter for screening for malignant neoplasm of colon: Secondary | ICD-10-CM

## 2022-05-16 DIAGNOSIS — Z1329 Encounter for screening for other suspected endocrine disorder: Secondary | ICD-10-CM | POA: Diagnosis not present

## 2022-05-16 DIAGNOSIS — M4302 Spondylolysis, cervical region: Secondary | ICD-10-CM

## 2022-05-16 DIAGNOSIS — Z0000 Encounter for general adult medical examination without abnormal findings: Secondary | ICD-10-CM

## 2022-05-16 DIAGNOSIS — Z125 Encounter for screening for malignant neoplasm of prostate: Secondary | ICD-10-CM | POA: Diagnosis not present

## 2022-05-16 DIAGNOSIS — Z0001 Encounter for general adult medical examination with abnormal findings: Secondary | ICD-10-CM

## 2022-05-16 DIAGNOSIS — Z13220 Encounter for screening for lipoid disorders: Secondary | ICD-10-CM | POA: Diagnosis not present

## 2022-05-16 DIAGNOSIS — F988 Other specified behavioral and emotional disorders with onset usually occurring in childhood and adolescence: Secondary | ICD-10-CM

## 2022-05-16 LAB — COMPREHENSIVE METABOLIC PANEL
ALT: 23 U/L (ref 0–53)
AST: 16 U/L (ref 0–37)
Albumin: 4.7 g/dL (ref 3.5–5.2)
Alkaline Phosphatase: 40 U/L (ref 39–117)
BUN: 25 mg/dL — ABNORMAL HIGH (ref 6–23)
CO2: 27 mEq/L (ref 19–32)
Calcium: 9.4 mg/dL (ref 8.4–10.5)
Chloride: 103 mEq/L (ref 96–112)
Creatinine, Ser: 1.22 mg/dL (ref 0.40–1.50)
GFR: 68.85 mL/min (ref >60.00)
Glucose, Bld: 100 mg/dL — ABNORMAL HIGH (ref 70–99)
Potassium: 4 mEq/L (ref 3.5–5.1)
Sodium: 139 mEq/L (ref 135–145)
Total Bilirubin: 0.5 mg/dL (ref 0.2–1.2)
Total Protein: 6.8 g/dL (ref 6.0–8.3)

## 2022-05-16 LAB — LIPID PANEL
Cholesterol: 215 mg/dL — ABNORMAL HIGH (ref 0–200)
HDL: 43.8 mg/dL (ref >39.00)
NonHDL: 170.91
Total CHOL/HDL Ratio: 5
Triglycerides: 268 mg/dL — ABNORMAL HIGH (ref 0.0–149.0)
VLDL: 53.6 mg/dL — ABNORMAL HIGH (ref 0.0–40.0)

## 2022-05-16 LAB — CBC WITH DIFFERENTIAL/PLATELET
Basophils Absolute: 0 10*3/uL (ref 0.0–0.1)
Basophils Relative: 0.6 % (ref 0.0–3.0)
Eosinophils Absolute: 0.1 10*3/uL (ref 0.0–0.7)
Eosinophils Relative: 1.1 % (ref 0.0–5.0)
HCT: 43.1 % (ref 39.0–52.0)
Hemoglobin: 14.9 g/dL (ref 13.0–17.0)
Lymphocytes Relative: 21.4 % (ref 12.0–46.0)
Lymphs Abs: 1.3 10*3/uL (ref 0.7–4.0)
MCHC: 34.7 g/dL (ref 30.0–36.0)
MCV: 89.8 fl (ref 78.0–100.0)
Monocytes Absolute: 0.5 10*3/uL (ref 0.1–1.0)
Monocytes Relative: 7.8 % (ref 3.0–12.0)
Neutro Abs: 4.1 10*3/uL (ref 1.4–7.7)
Neutrophils Relative %: 69.1 % (ref 43.0–77.0)
Platelets: 249 10*3/uL (ref 150.0–400.0)
RBC: 4.8 Mil/uL (ref 4.22–5.81)
RDW: 13 % (ref 11.5–15.5)
WBC: 6 10*3/uL (ref 4.0–10.5)

## 2022-05-16 LAB — HEMOGLOBIN A1C: Hgb A1c MFr Bld: 5.6 % (ref 4.6–6.5)

## 2022-05-16 LAB — LDL CHOLESTEROL, DIRECT: Direct LDL: 142 mg/dL

## 2022-05-16 LAB — PSA: PSA: 0.66 ng/mL (ref 0.10–4.00)

## 2022-05-16 NOTE — Progress Notes (Signed)
Billy Bray 52 y.o.   Chief Complaint  Patient presents with   Annual Exam    Chest pain and numbness in left hand  comes and goes     HISTORY OF PRESENT ILLNESS: This is a 52 y.o. male here for annual exam and follow-up on chronic medical conditions Also complaining of intermittent episodes of left-sided sharp chest pain with occasional radiation down the left arm and numbness and tingling of left hand for the past several weeks.  No other associated symptoms. No other complaints or medical concerns today. BP Readings from Last 3 Encounters:  05/16/22 122/84  10/11/21 (!) 138/94  12/03/20 136/82     HPI   Prior to Admission medications   Medication Sig Start Date End Date Taking? Authorizing Provider  albuterol (PROVENTIL) (2.5 MG/3ML) 0.083% nebulizer solution Take 3 mLs (2.5 mg total) by nebulization every 6 (six) hours as needed for wheezing or shortness of breath. 04/17/15  Yes Veryl Speak, FNP  albuterol (VENTOLIN HFA) 108 (90 Base) MCG/ACT inhaler Inhale 1-2 puffs into the lungs every 6 (six) hours as needed for wheezing or shortness of breath. 07/06/19  Yes Nehan Flaum, Eilleen Kempf, MD  amLODipine (NORVASC) 5 MG tablet TAKE 1 TABLET BY MOUTH EVERY DAY 01/10/22  Yes Merrin Mcvicker, Eilleen Kempf, MD  amphetamine-dextroamphetamine (ADDERALL XR) 30 MG 24 hr capsule Take 1 capsule (30 mg total) by mouth daily. 07/25/17  Yes Dragnev, Alphonse Guild, NP  ketoconazole (NIZORAL) 2 % cream APPLY TO AFFECTED AREA EVERY DAY 08/26/19  Yes Georgina Quint, MD  pantoprazole (PROTONIX) 40 MG tablet TAKE 1 TABLET BY MOUTH EVERY DAY 01/10/22  Yes Raissa Dam, Eilleen Kempf, MD  triamcinolone cream (KENALOG) 0.1 % APPLY TO AFFECTED AREA TWICE A DAY 07/09/20  Yes Shilee Biggs, Eilleen Kempf, MD  amphetamine-dextroamphetamine (ADDERALL) 20 MG tablet Take 1 tablet (20 mg total) by mouth daily as needed. 07/25/17   Dragnev, Alphonse Guild, NP  gabapentin (NEURONTIN) 300 MG capsule TAKE ONE CAPSULE IN AM,  EARLY AFTERNOON, AND THREE CAPSULES AT BEDTIME. 04/01/22   Georgina Quint, MD    Allergies  Allergen Reactions   Amoxicillin Hives    Patient Active Problem List   Diagnosis Date Noted   Essential hypertension 06/27/2018   Chronic pain syndrome 08/31/2017   Degeneration of intervertebral disc of cervical region 09/13/2016   TMJ dysfunction 08/08/2016   Cervical spondylolysis 08/05/2016   Polyarthralgia 05/24/2016   Asthma 09/14/2015   Secondary male hypogonadism 06/17/2015   ADD (attention deficit disorder) 09/02/2014   Neuropathy 09/02/2014    Past Medical History:  Diagnosis Date   Allergy    Arthritis    Asthma    Depression    Kidney stones     Past Surgical History:  Procedure Laterality Date   BILATERAL CARPAL TUNNEL RELEASE  2004    Social History   Socioeconomic History   Marital status: Divorced    Spouse name: Not on file   Number of children: 2   Years of education: 14   Highest education level: Not on file  Occupational History   Occupation: Personnel officer  Tobacco Use   Smoking status: Former   Smokeless tobacco: Never  Substance and Sexual Activity   Alcohol use: No   Drug use: Yes    Types: Marijuana   Sexual activity: Not Currently  Other Topics Concern   Not on file  Social History Narrative   Fun: Entertainment sounds/lights for concerts,   Denies religious beliefs effecting health care.  Social Determinants of Health   Financial Resource Strain: Not on file  Food Insecurity: Not on file  Transportation Needs: Not on file  Physical Activity: Not on file  Stress: Not on file  Social Connections: Not on file  Intimate Partner Violence: Not on file    Family History  Problem Relation Age of Onset   Healthy Mother    Healthy Father    Stroke Maternal Grandmother    Diabetes Maternal Grandmother    Stroke Maternal Grandfather    Diabetes Maternal Grandfather    Colon cancer Paternal Grandfather      Review of Systems   Constitutional: Negative.  Negative for chills and fever.  HENT: Negative.  Negative for congestion and sore throat.   Respiratory: Negative.  Negative for cough and shortness of breath.   Cardiovascular:  Positive for chest pain. Negative for palpitations and leg swelling.  Gastrointestinal:  Negative for abdominal pain, diarrhea, nausea and vomiting.  Genitourinary: Negative.  Negative for dysuria and hematuria.  Skin: Negative.  Negative for rash.  Neurological: Negative.  Negative for dizziness and headaches.  All other systems reviewed and are negative.   Vitals:   05/16/22 1450  BP: 122/84  Pulse: 80  Temp: 98.2 F (36.8 C)  SpO2: 96%    Physical Exam Vitals reviewed.  Constitutional:      Appearance: Normal appearance.  HENT:     Head: Normocephalic.     Right Ear: Tympanic membrane, ear canal and external ear normal.     Left Ear: Tympanic membrane, ear canal and external ear normal.     Mouth/Throat:     Mouth: Mucous membranes are moist.     Pharynx: Oropharynx is clear.  Eyes:     Extraocular Movements: Extraocular movements intact.     Conjunctiva/sclera: Conjunctivae normal.     Pupils: Pupils are equal, round, and reactive to light.  Cardiovascular:     Rate and Rhythm: Normal rate and regular rhythm.     Pulses: Normal pulses.     Heart sounds: Normal heart sounds.  Pulmonary:     Effort: Pulmonary effort is normal.     Breath sounds: Normal breath sounds.  Abdominal:     General: There is no distension.     Palpations: Abdomen is soft.     Tenderness: There is no abdominal tenderness.  Musculoskeletal:     Cervical back: No tenderness.     Right lower leg: No edema.     Left lower leg: No edema.  Lymphadenopathy:     Cervical: No cervical adenopathy.  Skin:    General: Skin is warm and dry.     Capillary Refill: Capillary refill takes less than 2 seconds.  Neurological:     General: No focal deficit present.     Mental Status: He is alert and  oriented to person, place, and time.  Psychiatric:        Mood and Affect: Mood normal.        Behavior: Behavior normal.    DG Chest 2 View  Result Date: 05/16/2022 CLINICAL DATA:  Chest pain. EXAM: CHEST - 2 VIEW COMPARISON:  Radiographs 10/24/2018 and 03/26/2018.  CT 05/22/2009. FINDINGS: The heart size and mediastinal contours are stable. There is chronic blunting of the left costophrenic angle consistent with posttraumatic pleural thickening. There is stable mild chronic scarring at the left lung base. No pleural effusion or pneumothorax. Several old left-sided rib fractures are noted without evidence of acute osseous abnormality.  IMPRESSION: No evidence of acute cardiopulmonary process. Old left chest wall trauma with chronic left-sided pleural and parenchymal scarring. Electronically Signed   By: Carey Bullocks M.D.   On: 05/16/2022 15:51    EKG: Normal sinus rhythm with a ventricular rate of 80.  No acute ischemic changes.  Normal EKG.  ASSESSMENT & PLAN: Problem List Items Addressed This Visit       Cardiovascular and Mediastinum   Essential hypertension    BP Readings from Last 3 Encounters:  05/16/22 122/84  10/11/21 (!) 138/94  12/03/20 136/82  Well-controlled hypertension Continue amlodipine 5 mg daily Cardiovascular risk associated with hypertension discussed Diet and nutrition discussed.       Relevant Orders   CBC with Differential   Comprehensive metabolic panel   Hemoglobin A1c   Lipid panel     Respiratory   Asthma    Stable and well-controlled. No recent use of albuterol rescue inhaler        Musculoskeletal and Integument   Cervical spondylolysis     Other   ADD (attention deficit disorder)   Polyarthralgia    Stable and well-controlled      Chronic pain syndrome   Nonspecific chest pain    Clinically stable.  No red flag signs or symptoms. History hypertension on amlodipine Differential diagnosis discussed Normal EKG Chest x-ray done  today.  Report reviewed Recommend cardiology evaluation Referral placed today      Relevant Orders   EKG 12-Lead   Ambulatory referral to Cardiology   DG Chest 2 View (Completed)   Other Visit Diagnoses     Encounter for general adult medical examination with abnormal findings    -  Primary   Relevant Orders   CBC with Differential   Comprehensive metabolic panel   Hemoglobin A1c   Lipid panel   PSA(Must document that pt has been informed of limitations of PSA testing.)   HIV antibody   Hepatitis C antibody screen   Screening for colon cancer       Relevant Orders   Ambulatory referral to Gastroenterology   Need for hepatitis C screening test       Relevant Orders   Hepatitis C antibody screen   Screening for HIV (human immunodeficiency virus)       Relevant Orders   HIV antibody   Prostate cancer screening       Relevant Orders   PSA(Must document that pt has been informed of limitations of PSA testing.)   Screening for deficiency anemia       Relevant Orders   CBC with Differential   Screening for lipoid disorders       Relevant Orders   Lipid panel   Screening for endocrine, metabolic and immunity disorder       Relevant Orders   Hemoglobin A1c      Modifiable risk factors discussed with patient. Anticipatory guidance according to age provided. The following topics were also discussed: Social Determinants of Health Smoking.  No longer smoking Diet and nutrition and need to decrease amount of daily carbohydrate intake and daily calories and increase amount of plant-based protein in his diet Benefits of exercise Cancer screening and need for colon cancer screening with colonoscopy Vaccinations reviewed and recommendations Cardiovascular risk assessment The 10-year ASCVD risk score (Arnett DK, et al., 2019) is: 4.3%   Values used to calculate the score:     Age: 39 years     Sex: Male     Is Non-Hispanic African  American: No     Diabetic: No     Tobacco  smoker: No     Systolic Blood Pressure: 122 mmHg     Is BP treated: Yes     HDL Cholesterol: 45 mg/dL     Total Cholesterol: 192 mg/dL Review of chronic medical conditions Review of all medications Mental health including depression and anxiety Fall and accident prevention  Patient Instructions  Health Maintenance, Male Adopting a healthy lifestyle and getting preventive care are important in promoting health and wellness. Ask your health care provider about: The right schedule for you to have regular tests and exams. Things you can do on your own to prevent diseases and keep yourself healthy. What should I know about diet, weight, and exercise? Eat a healthy diet  Eat a diet that includes plenty of vegetables, fruits, low-fat dairy products, and lean protein. Do not eat a lot of foods that are high in solid fats, added sugars, or sodium. Maintain a healthy weight Body mass index (BMI) is a measurement that can be used to identify possible weight problems. It estimates body fat based on height and weight. Your health care provider can help determine your BMI and help you achieve or maintain a healthy weight. Get regular exercise Get regular exercise. This is one of the most important things you can do for your health. Most adults should: Exercise for at least 150 minutes each week. The exercise should increase your heart rate and make you sweat (moderate-intensity exercise). Do strengthening exercises at least twice a week. This is in addition to the moderate-intensity exercise. Spend less time sitting. Even light physical activity can be beneficial. Watch cholesterol and blood lipids Have your blood tested for lipids and cholesterol at 51 years of age, then have this test every 5 years. You may need to have your cholesterol levels checked more often if: Your lipid or cholesterol levels are high. You are older than 51 years of age. You are at high risk for heart disease. What should  I know about cancer screening? Many types of cancers can be detected early and may often be prevented. Depending on your health history and family history, you may need to have cancer screening at various ages. This may include screening for: Colorectal cancer. Prostate cancer. Skin cancer. Lung cancer. What should I know about heart disease, diabetes, and high blood pressure? Blood pressure and heart disease High blood pressure causes heart disease and increases the risk of stroke. This is more likely to develop in people who have high blood pressure readings or are overweight. Talk with your health care provider about your target blood pressure readings. Have your blood pressure checked: Every 3-5 years if you are 87-64 years of age. Every year if you are 33 years old or older. If you are between the ages of 72 and 38 and are a current or former smoker, ask your health care provider if you should have a one-time screening for abdominal aortic aneurysm (AAA). Diabetes Have regular diabetes screenings. This checks your fasting blood sugar level. Have the screening done: Once every three years after age 59 if you are at a normal weight and have a low risk for diabetes. More often and at a younger age if you are overweight or have a high risk for diabetes. What should I know about preventing infection? Hepatitis B If you have a higher risk for hepatitis B, you should be screened for this virus. Talk with your health care provider  to find out if you are at risk for hepatitis B infection. Hepatitis C Blood testing is recommended for: Everyone born from [birth year] through 1964. Anyone with known risk factors for hepatitis C. Sexually transmitted infections (STIs) You should be screened each year for STIs, including gonorrhea and chlamydia, if: You are sexually active and are younger than 51 years of age. You are older than 51 years of age and your health care provider tells you that you are at risk  for this type of infection. Your sexual activity has changed since you were last screened, and you are at increased risk for chlamydia or gonorrhea. Ask your health care provider if you are at risk. Ask your health care provider about whether you are at high risk for HIV. Your health care provider may recommend a prescription medicine to help prevent HIV infection. If you choose to take medicine to prevent HIV, you should first get tested for HIV. You should then be tested every 3 months for as long as you are taking the medicine. Follow these instructions at home: Alcohol use Do not drink alcohol if your health care provider tells you not to drink. If you drink alcohol: Limit how much you have to 0-2 drinks a day. Know how much alcohol is in your drink. In the [place] one drink equals one 12 oz bottle of beer (355 mL), one 5 oz glass of wine (148 mL), or one 1 oz glass of hard liquor (44 mL). Lifestyle Do not use any products that contain nicotine or tobacco. These products include cigarettes, chewing tobacco, and vaping devices, such as e-cigarettes. If you need help quitting, ask your health care provider. Do not use street drugs. Do not share needles. Ask your health care provider for help if you need support or information about quitting drugs. General instructions Schedule regular health, dental, and eye exams. Stay current with your vaccines. Tell your health care provider if: You often feel depressed. You have ever been abused or do not feel safe at home. Summary Adopting a healthy lifestyle and getting preventive care are important in promoting health and wellness. Follow your health care provider's instructions about healthy diet, exercising, and getting tested or screened for diseases. Follow your health care provider's instructions on monitoring your cholesterol and blood pressure. This information is not intended to replace advice given to you by your health care provider. Make  sure you discuss any questions you have with your health care provider. Document Revised: 06/01/2020 Document Reviewed: 06/01/2020 Elsevier Patient Education  2023 Elsevier Inc.      Edwina Barth, MD  Primary Care at [facility]

## 2022-05-16 NOTE — Assessment & Plan Note (Signed)
Stable and well controlled. 

## 2022-05-16 NOTE — Assessment & Plan Note (Signed)
BP Readings from Last 3 Encounters:  05/16/22 122/84  10/11/21 (!) 138/94  12/03/20 136/82  Well-controlled hypertension Continue amlodipine 5 mg daily Cardiovascular risk associated with hypertension discussed Diet and nutrition discussed.

## 2022-05-16 NOTE — Patient Instructions (Signed)
Health Maintenance, Male Adopting a healthy lifestyle and getting preventive care are important in promoting health and wellness. Ask your health care provider about: The right schedule for you to have regular tests and exams. Things you can do on your own to prevent diseases and keep yourself healthy. What should I know about diet, weight, and exercise? Eat a healthy diet  Eat a diet that includes plenty of vegetables, fruits, low-fat dairy products, and lean protein. Do not eat a lot of foods that are high in solid fats, added sugars, or sodium. Maintain a healthy weight Body mass index (BMI) is a measurement that can be used to identify possible weight problems. It estimates body fat based on height and weight. Your health care provider can help determine your BMI and help you achieve or maintain a healthy weight. Get regular exercise Get regular exercise. This is one of the most important things you can do for your health. Most adults should: Exercise for at least 150 minutes each week. The exercise should increase your heart rate and make you sweat (moderate-intensity exercise). Do strengthening exercises at least twice a week. This is in addition to the moderate-intensity exercise. Spend less time sitting. Even light physical activity can be beneficial. Watch cholesterol and blood lipids Have your blood tested for lipids and cholesterol at 51 years of age, then have this test every 5 years. You may need to have your cholesterol levels checked more often if: Your lipid or cholesterol levels are high. You are older than 51 years of age. You are at high risk for heart disease. What should I know about cancer screening? Many types of cancers can be detected early and may often be prevented. Depending on your health history and family history, you may need to have cancer screening at various ages. This may include screening for: Colorectal cancer. Prostate cancer. Skin cancer. Lung  cancer. What should I know about heart disease, diabetes, and high blood pressure? Blood pressure and heart disease High blood pressure causes heart disease and increases the risk of stroke. This is more likely to develop in people who have high blood pressure readings or are overweight. Talk with your health care provider about your target blood pressure readings. Have your blood pressure checked: Every 3-5 years if you are 18-39 years of age. Every year if you are 40 years old or older. If you are between the ages of 65 and 75 and are a current or former smoker, ask your health care provider if you should have a one-time screening for abdominal aortic aneurysm (AAA). Diabetes Have regular diabetes screenings. This checks your fasting blood sugar level. Have the screening done: Once every three years after age 45 if you are at a normal weight and have a low risk for diabetes. More often and at a younger age if you are overweight or have a high risk for diabetes. What should I know about preventing infection? Hepatitis B If you have a higher risk for hepatitis B, you should be screened for this virus. Talk with your health care provider to find out if you are at risk for hepatitis B infection. Hepatitis C Blood testing is recommended for: Everyone born from 1945 through 1965. Anyone with known risk factors for hepatitis C. Sexually transmitted infections (STIs) You should be screened each year for STIs, including gonorrhea and chlamydia, if: You are sexually active and are younger than 51 years of age. You are older than 51 years of age and your   health care provider tells you that you are at risk for this type of infection. Your sexual activity has changed since you were last screened, and you are at increased risk for chlamydia or gonorrhea. Ask your health care provider if you are at risk. Ask your health care provider about whether you are at high risk for HIV. Your health care provider  may recommend a prescription medicine to help prevent HIV infection. If you choose to take medicine to prevent HIV, you should first get tested for HIV. You should then be tested every 3 months for as long as you are taking the medicine. Follow these instructions at home: Alcohol use Do not drink alcohol if your health care provider tells you not to drink. If you drink alcohol: Limit how much you have to 0-2 drinks a day. Know how much alcohol is in your drink. In the [place] one drink equals one 12 oz bottle of beer (355 mL), one 5 oz glass of wine (148 mL), or one 1 oz glass of hard liquor (44 mL). Lifestyle Do not use any products that contain nicotine or tobacco. These products include cigarettes, chewing tobacco, and vaping devices, such as e-cigarettes. If you need help quitting, ask your health care provider. Do not use street drugs. Do not share needles. Ask your health care provider for help if you need support or information about quitting drugs. General instructions Schedule regular health, dental, and eye exams. Stay current with your vaccines. Tell your health care provider if: You often feel depressed. You have ever been abused or do not feel safe at home. Summary Adopting a healthy lifestyle and getting preventive care are important in promoting health and wellness. Follow your health care provider's instructions about healthy diet, exercising, and getting tested or screened for diseases. Follow your health care provider's instructions on monitoring your cholesterol and blood pressure. This information is not intended to replace advice given to you by your health care provider. Make sure you discuss any questions you have with your health care provider. Document Revised: 06/01/2020 Document Reviewed: 06/01/2020 Elsevier Patient Education  2023 Elsevier Inc.  

## 2022-05-16 NOTE — Assessment & Plan Note (Signed)
Clinically stable.  No red flag signs or symptoms. History hypertension on amlodipine Differential diagnosis discussed Normal EKG Chest x-ray done today.  Report reviewed Recommend cardiology evaluation Referral placed today

## 2022-05-16 NOTE — Assessment & Plan Note (Signed)
Stable and well-controlled. No recent use of albuterol rescue inhaler

## 2022-05-17 LAB — HIV ANTIBODY (ROUTINE TESTING W REFLEX): HIV 1&2 Ab, 4th Generation: NONREACTIVE

## 2022-05-17 LAB — HEPATITIS C ANTIBODY: Hepatitis C Ab: NONREACTIVE

## 2022-05-27 ENCOUNTER — Encounter: Payer: Self-pay | Admitting: Gastroenterology

## 2022-06-10 ENCOUNTER — Ambulatory Visit (AMBULATORY_SURGERY_CENTER): Payer: 59

## 2022-06-10 VITALS — Ht 71.0 in | Wt 220.0 lb

## 2022-06-10 DIAGNOSIS — Z1211 Encounter for screening for malignant neoplasm of colon: Secondary | ICD-10-CM

## 2022-06-10 MED ORDER — NA SULFATE-K SULFATE-MG SULF 17.5-3.13-1.6 GM/177ML PO SOLN
1.0000 | Freq: Once | ORAL | 0 refills | Status: AC
Start: 2022-06-10 — End: 2022-06-10

## 2022-06-10 NOTE — Progress Notes (Signed)
No egg or soy allergy known to patient  No issues known to pt with past sedation with any surgeries or procedures Patient denies ever being told they had issues or difficulty with intubation  No FH of Malignant Hyperthermia Pt is not on diet pills Pt is not on  home 02  Pt is not on blood thinners  Pt denies issues with constipation  No A fib or A flutter Have any cardiac testing pending --  Pt has apt to establish care with a cardiologist. No pending test at this time. Pt is ambulatory   Patient's chart reviewed by Cathlyn Parsons CNRA prior to previsit and patient appropriate for the [facility].  Previsit completed and red dot placed by patient's name on their procedure day (on provider's schedule).     PV completed with patient. Prep reviewed instruction sent via mychart and to mailing address.Good rx for [facility] provided.  Pt instructed to use Single[employer]com or GoodRx for a price reduction on prep

## 2022-06-20 NOTE — Progress Notes (Deleted)
Cardiology Office Note:    Date:  06/20/2022   ID:  ARTIS SOUTHAM, DOB 01/15/1971, MRN [id number]  PCP:  Georgina Quint, MD  Cardiologist:  None  Electrophysiologist:  None   Referring MD: Georgina Quint, *   No chief complaint on file. ***  History of Present Illness:    Billy Bray is a 52 y.o. male with a hx of hypertension, asthma who is referred by Dr. Alvy Bimler for evaluation of chest pain.  Past Medical History:  Diagnosis Date   Allergy    Arthritis    Asthma    Depression    Hypertension    Kidney stones     Past Surgical History:  Procedure Laterality Date   BILATERAL CARPAL TUNNEL RELEASE  2004    Current Medications: No outpatient medications have been marked as taking for the 06/22/22 encounter (Appointment) with Little Ishikawa, MD.     Allergies:   Amoxicillin   Social History   Socioeconomic History   Marital status: Divorced    Spouse name: Not on file   Number of children: 2   Years of education: 14   Highest education level: Not on file  Occupational History   Occupation: Personnel officer  Tobacco Use   Smoking status: Former   Smokeless tobacco: Never  Building services engineer Use: Never used  Substance and Sexual Activity   Alcohol use: No   Drug use: Yes    Types: Marijuana   Sexual activity: Not Currently  Other Topics Concern   Not on file  Social History Narrative   Fun: Entertainment sounds/lights for concerts,   Denies religious beliefs effecting health care.    Social Determinants of Health   Financial Resource Strain: Not on file  Food Insecurity: Not on file  Transportation Needs: Not on file  Physical Activity: Not on file  Stress: Not on file  Social Connections: Not on file     Family History: The patient's ***family history includes Colon cancer in his paternal grandfather; Diabetes in his maternal grandfather and maternal grandmother; Healthy in his father and mother; Stroke in his maternal  grandfather and maternal grandmother. There is no history of Rectal cancer or Stomach cancer.  ROS:   Please see the history of present illness.    *** All other systems reviewed and are negative.  EKGs/Labs/Other Studies Reviewed:    The following studies were reviewed today: ***  EKG:  EKG is *** ordered today.  The ekg ordered today demonstrates ***  Recent Labs: 05/16/2022: ALT 23; BUN 25; Creatinine, Ser 1.22; Hemoglobin 14.9; Platelets 249.0; Potassium 4.0; Sodium 139  Recent Lipid Panel    Component Value Date/Time   CHOL 215 (H) 05/16/2022 1534   CHOL 192 12/25/2019 1509   TRIG 268.0 (H) 05/16/2022 1534   HDL 43.80 05/16/2022 1534   HDL 45 12/25/2019 1509   CHOLHDL 5 05/16/2022 1534   VLDL 53.6 (H) 05/16/2022 1534   LDLCALC 94 12/25/2019 1509   LDLDIRECT 142.0 05/16/2022 1534    Physical Exam:    VS:  There were no vitals taken for this visit.    Wt Readings from Last 3 Encounters:  06/10/22 220 lb (99.8 kg)  05/16/22 226 lb (102.5 kg)  12/03/20 218 lb (98.9 kg)     GEN: *** Well nourished, well developed in no acute distress HEENT: Normal NECK: No JVD; No carotid bruits LYMPHATICS: No lymphadenopathy CARDIAC: ***RRR, no murmurs, rubs, gallops RESPIRATORY:  Clear to  auscultation without rales, wheezing or rhonchi  ABDOMEN: Soft, non-tender, non-distended MUSCULOSKELETAL:  No edema; No deformity  SKIN: Warm and dry NEUROLOGIC:  Alert and oriented x 3 PSYCHIATRIC:  Normal affect   ASSESSMENT:    No diagnosis found. PLAN:    Chest pain:  Hypertension: On amlodipine 5 mg daily  RTC in***  Medication Adjustments/Labs and Tests Ordered: Current medicines are reviewed at length with the patient today.  Concerns regarding medicines are outlined above.  No orders of the defined types were placed in this encounter.  No orders of the defined types were placed in this encounter.   There are no Patient Instructions on file for this visit.    Signed, Little Ishikawa, MD  06/20/2022 10:34 PM    [facility]

## 2022-06-22 ENCOUNTER — Ambulatory Visit: Payer: 59 | Admitting: Cardiology

## 2022-06-23 ENCOUNTER — Encounter: Payer: Self-pay | Admitting: Gastroenterology

## 2022-06-27 NOTE — Progress Notes (Unsigned)
No chief complaint on file.  History of Present Illness: 52 yo male with history of ADHD, asthma, cervical disc disease, chronic pain syndrome and HTN here today as a new consult, referred by Dr. Alvy Bimler, for evaluation of chest pain. ***  Primary Care Physician: Georgina Quint, MD   Past Medical History:  Diagnosis Date   ADD (attention deficit disorder)    Allergy    Arthritis    Asthma    Cervical spondylolysis    Chronic pain syndrome    DDD (degenerative disc disease), cervical    Depression    Hypertension    Kidney stones    Male hypogonadism    Neuropathy    Polyarthralgia    TMJ dysfunction     Past Surgical History:  Procedure Laterality Date   BILATERAL CARPAL TUNNEL RELEASE  2004    Current Outpatient Medications  Medication Sig Dispense Refill   albuterol (PROVENTIL) (2.5 MG/3ML) 0.083% nebulizer solution Take 3 mLs (2.5 mg total) by nebulization every 6 (six) hours as needed for wheezing or shortness of breath. 150 mL 1   albuterol (VENTOLIN HFA) 108 (90 Base) MCG/ACT inhaler Inhale 1-2 puffs into the lungs every 6 (six) hours as needed for wheezing or shortness of breath. 18 g 1   amLODipine (NORVASC) 5 MG tablet TAKE 1 TABLET BY MOUTH EVERY DAY 90 tablet 1   amphetamine-dextroamphetamine (ADDERALL XR) 30 MG 24 hr capsule Take 1 capsule (30 mg total) by mouth daily. 30 capsule 0   amphetamine-dextroamphetamine (ADDERALL) 20 MG tablet Take 1 tablet (20 mg total) by mouth daily as needed. 30 tablet 0   amphetamine-dextroamphetamine (ADDERALL) 30 MG tablet Take 1 tablet by mouth daily.     gabapentin (NEURONTIN) 300 MG capsule TAKE ONE CAPSULE IN AM, EARLY AFTERNOON, AND THREE CAPSULES AT BEDTIME. 450 capsule 1   ketoconazole (NIZORAL) 2 % cream APPLY TO AFFECTED AREA EVERY DAY (Patient not taking: Reported on 06/10/2022) 15 g 3   pantoprazole (PROTONIX) 40 MG tablet TAKE 1 TABLET BY MOUTH EVERY DAY 180 tablet 3   triamcinolone cream (KENALOG) 0.1 %  APPLY TO AFFECTED AREA TWICE A DAY (Patient not taking: Reported on 06/10/2022) 453.6 g 1   No current facility-administered medications for this visit.    Allergies  Allergen Reactions   Amoxicillin Hives    Social History   Socioeconomic History   Marital status: Divorced    Spouse name: Not on file   Number of children: 2   Years of education: 14   Highest education level: Not on file  Occupational History   Occupation: Personnel officer  Tobacco Use   Smoking status: Former   Smokeless tobacco: Never  Building services engineer Use: Never used  Substance and Sexual Activity   Alcohol use: No   Drug use: Yes    Types: Marijuana   Sexual activity: Not Currently  Other Topics Concern   Not on file  Social History Narrative   Fun: Entertainment sounds/lights for concerts,   Denies religious beliefs effecting health care.    Social Determinants of Health   Financial Resource Strain: Not on file  Food Insecurity: Not on file  Transportation Needs: Not on file  Physical Activity: Not on file  Stress: Not on file  Social Connections: Not on file  Intimate Partner Violence: Not on file    Family History  Problem Relation Age of Onset   Healthy Mother    Healthy Father  Stroke Maternal Grandmother    Diabetes Maternal Grandmother    Stroke Maternal Grandfather    Diabetes Maternal Grandfather    Colon cancer Paternal Grandfather    Rectal cancer Neg Hx    Stomach cancer Neg Hx     Review of Systems:  As stated in the HPI and otherwise negative.   There were no vitals taken for this visit.  Physical Examination: General: Well developed, well nourished, NAD  HEENT: OP clear, mucus membranes moist  SKIN: warm, dry. No rashes. Neuro: No focal deficits  Musculoskeletal: Muscle strength 5/5 all ext  Psychiatric: Mood and affect normal  Neck: No JVD, no carotid bruits, no thyromegaly, no lymphadenopathy.  Lungs:Clear bilaterally, no wheezes, rhonci,  crackles Cardiovascular: Regular rate and rhythm. No murmurs, gallops or rubs. Abdomen:Soft. Bowel sounds present. Non-tender.  Extremities: No lower extremity edema. Pulses are 2 + in the bilateral DP/PT.  EKG:  EKG {ACTION; IS/IS [id number]} ordered today. The ekg ordered today demonstrates ***  Recent Labs: 05/16/2022: ALT 23; BUN 25; Creatinine, Ser 1.22; Hemoglobin 14.9; Platelets 249.0; Potassium 4.0; Sodium 139   Lipid Panel    Component Value Date/Time   CHOL 215 (H) 05/16/2022 1534   CHOL 192 12/25/2019 1509   TRIG 268.0 (H) 05/16/2022 1534   HDL 43.80 05/16/2022 1534   HDL 45 12/25/2019 1509   CHOLHDL 5 05/16/2022 1534   VLDL 53.6 (H) 05/16/2022 1534   LDLCALC 94 12/25/2019 1509   LDLDIRECT 142.0 05/16/2022 1534     Wt Readings from Last 3 Encounters:  06/10/22 99.8 kg  05/16/22 102.5 kg  12/03/20 98.9 kg      Assessment and Plan:   1.   Labs/ tests ordered today include:  No orders of the defined types were placed in this encounter.    Disposition:   F/U with me in ***    Signed, Verne Carrow, MD, [facility] 06/27/2022 8:12 AM    [facility] [address] [place], [place], [place]  [address] Phone: [phone]; Fax: [phone]

## 2022-06-28 ENCOUNTER — Encounter: Payer: Self-pay | Admitting: Cardiovascular Disease

## 2022-06-28 ENCOUNTER — Ambulatory Visit: Payer: 59 | Attending: Cardiovascular Disease | Admitting: Cardiovascular Disease

## 2022-06-28 VITALS — BP 130/100 | HR 88 | Ht 71.0 in | Wt 223.4 lb

## 2022-06-28 DIAGNOSIS — R079 Chest pain, unspecified: Secondary | ICD-10-CM

## 2022-06-28 NOTE — Patient Instructions (Signed)
Medication Instructions:  No changes *If you need a refill on your cardiac medications before your next appointment, please call your pharmacy*   Lab Work: none If you have labs (blood work) drawn today and your tests are completely normal, you will receive your results only by: MyChart Message (if you have MyChart) OR A paper copy in the mail If you have any lab test that is abnormal or we need to change your treatment, we will call you to review the results.   Testing/Procedures: None ordered today.  Follow-Up: As needed

## 2022-07-01 ENCOUNTER — Ambulatory Visit (AMBULATORY_SURGERY_CENTER): Payer: 59 | Admitting: Gastroenterology

## 2022-07-01 ENCOUNTER — Encounter: Payer: Self-pay | Admitting: Gastroenterology

## 2022-07-01 VITALS — BP 128/86 | HR 85 | Temp 98.9°F | Resp 15 | Ht 71.0 in | Wt 220.0 lb

## 2022-07-01 DIAGNOSIS — D124 Benign neoplasm of descending colon: Secondary | ICD-10-CM | POA: Diagnosis not present

## 2022-07-01 DIAGNOSIS — Z1211 Encounter for screening for malignant neoplasm of colon: Secondary | ICD-10-CM

## 2022-07-01 DIAGNOSIS — D123 Benign neoplasm of transverse colon: Secondary | ICD-10-CM

## 2022-07-01 MED ORDER — SODIUM CHLORIDE 0.9 % IV SOLN
500.0000 mL | Freq: Once | INTRAVENOUS | Status: DC
Start: 2022-07-01 — End: 2022-07-01

## 2022-07-01 NOTE — Progress Notes (Signed)
Called to room to assist during endoscopic procedure.  Patient ID and intended procedure confirmed with present staff. Received instructions for my participation in the procedure from the performing physician.  

## 2022-07-01 NOTE — Op Note (Signed)
[facility] Patient Name: Billy Bray Procedure Date: 07/01/2022 10:58 AM MRN: [id number] Endoscopist: Sherilyn Cooter L. Myrtie Neither , MD, [id number] Age: 52 Referring MD:  Date of Birth: 10/16/1970 Gender: Male Account #: [id number] Procedure:                Colonoscopy Indications:              Screening for colorectal malignant neoplasm, This                            is the patient's first colonoscopy Medicines:                Monitored Anesthesia Care Procedure:                Pre-Anesthesia Assessment:                           - Prior to the procedure, a History and Physical                            was performed, and patient medications and                            allergies were reviewed. The patient's tolerance of                            previous anesthesia was also reviewed. The risks                            and benefits of the procedure and the sedation                            options and risks were discussed with the patient.                            All questions were answered, and informed consent                            was obtained. Prior Anticoagulants: The patient has                            taken no anticoagulant or antiplatelet agents. ASA                            Grade Assessment: II - A patient with mild systemic                            disease. After reviewing the risks and benefits,                            the patient was deemed in satisfactory condition to                            undergo the procedure.  After obtaining informed consent, the colonoscope                            was passed under direct vision. Throughout the                            procedure, the patient's blood pressure, pulse, and                            oxygen saturations were monitored continuously. The                            Olympus CF-HQ190L [id number]) Colonoscope was                            introduced through the anus and  advanced to the the                            cecum, identified by appendiceal orifice and                            ileocecal valve. The colonoscopy was performed                            without difficulty. The patient tolerated the                            procedure well. The quality of the bowel                            preparation was excellent. The ileocecal valve,                            appendiceal orifice, and rectum were photographed. Scope In: 11:13:50 AM Scope Out: 11:27:56 AM Scope Withdrawal Time: 0 hours 12 minutes 1 second  Total Procedure Duration: 0 hours 14 minutes 6 seconds  Findings:                 The perianal and digital rectal examinations were                            normal.                           Repeat examination of right colon under NBI                            performed.                           Diverticula were found in the entire colon.                           Two sessile polyps were found in the descending  colon and transverse colon. The polyps were 5 mm in                            size. These polyps were removed with a cold snare.                            Resection and retrieval were complete.                           Internal hemorrhoids were found. The hemorrhoids                            were small.                           The exam was otherwise without abnormality on                            direct and retroflexion views. Complications:            No immediate complications. Estimated Blood Loss:     Estimated blood loss was minimal. Impression:               - Diverticulosis in the entire examined colon.                           - Two 5 mm polyps in the descending colon and in                            the transverse colon, removed with a cold snare.                            Resected and retrieved.                           - Internal hemorrhoids.                           - The  examination was otherwise normal on direct                            and retroflexion views. Recommendation:           - Patient has a contact number available for                            emergencies. The signs and symptoms of potential                            delayed complications were discussed with the                            patient. Return to normal activities tomorrow.                            Written discharge instructions were provided to  the                            patient.                           - Resume previous diet.                           - Continue present medications.                           - Await pathology results.                           - Repeat colonoscopy is recommended for                            surveillance. The colonoscopy date will be                            determined after pathology results from today's                            exam become available for review. Billy Bray L. Myrtie Neither, MD 07/01/2022 11:31:11 AM This report has been signed electronically.

## 2022-07-01 NOTE — Progress Notes (Signed)
VS by DT  Pt's states no medical or surgical changes since previsit or office visit.  

## 2022-07-01 NOTE — Progress Notes (Signed)
Uneventful anesthetic. Report to pacu rn. Vss. Care resumed by rn. 

## 2022-07-01 NOTE — Progress Notes (Signed)
History and Physical:  This patient presents for endoscopic testing for: Encounter Diagnosis  Name Primary?   Special screening for malignant neoplasms, colon Yes    Average risk for colorectal cancer.  First screening exam.  Patient denies chronic abdominal pain, rectal bleeding, constipation or diarrhea.    Patient is otherwise without complaints or active issues today.   Past Medical History: Past Medical History:  Diagnosis Date   ADD (attention deficit disorder)    Allergy    Arthritis    Asthma    Cervical spondylolysis    Chronic pain syndrome    DDD (degenerative disc disease), cervical    Depression    Hypertension    Kidney stones    Male hypogonadism    Neuropathy    Polyarthralgia    TMJ dysfunction      Past Surgical History: Past Surgical History:  Procedure Laterality Date   BILATERAL CARPAL TUNNEL RELEASE  2004   CHEST TUBE INSERTION      Allergies: Allergies  Allergen Reactions   Amoxicillin Hives    Outpatient Meds: Current Outpatient Medications  Medication Sig Dispense Refill   amLODipine (NORVASC) 5 MG tablet TAKE 1 TABLET BY MOUTH EVERY DAY 90 tablet 1   Amphetamine ER (ADZENYS XR-ODT) 18.8 MG TBED Take by mouth daily.     amphetamine-dextroamphetamine (ADDERALL) 30 MG tablet Take 1 tablet by mouth daily.     cetirizine (ZYRTEC) 5 MG tablet Take 5 mg by mouth daily.     gabapentin (NEURONTIN) 300 MG capsule TAKE ONE CAPSULE IN AM, EARLY AFTERNOON, AND THREE CAPSULES AT BEDTIME. 450 capsule 1   pantoprazole (PROTONIX) 40 MG tablet TAKE 1 TABLET BY MOUTH EVERY DAY 180 tablet 3   triamcinolone cream (KENALOG) 0.1 % APPLY TO AFFECTED AREA TWICE A DAY 453.6 g 1   albuterol (PROVENTIL) (2.5 MG/3ML) 0.083% nebulizer solution Take 3 mLs (2.5 mg total) by nebulization every 6 (six) hours as needed for wheezing or shortness of breath. 150 mL 1   albuterol (VENTOLIN HFA) 108 (90 Base) MCG/ACT inhaler Inhale 1-2 puffs into the lungs every 6 (six)  hours as needed for wheezing or shortness of breath. 18 g 1   ketoconazole (NIZORAL) 2 % cream APPLY TO AFFECTED AREA EVERY DAY 15 g 3   Current Facility-Administered Medications  Medication Dose Route Frequency Provider Last Rate Last Admin   0.9 %  sodium chloride infusion  500 mL Intravenous Once Sherrilyn Rist, MD          ___________________________________________________________________ Objective   Exam:  BP (!) 153/99   Pulse 99   Temp 98.9 F (37.2 C)   Ht 5\' 11"  (1.803 m)   Wt 220 lb (99.8 kg)   SpO2 96%   BMI 30.68 kg/m   CV: regular , S1/S2 Resp: clear to auscultation bilaterally, normal RR and effort noted GI: soft, no tenderness, with active bowel sounds.   Assessment: Encounter Diagnosis  Name Primary?   Special screening for malignant neoplasms, colon Yes     Plan: Colonoscopy   The benefits and risks of the planned procedure were described in detail with the patient or (when appropriate) their health care proxy.  Risks were outlined as including, but not limited to, bleeding, infection, perforation, adverse medication reaction leading to cardiac or pulmonary decompensation, pancreatitis (if ERCP).  The limitation of incomplete mucosal visualization was also discussed.  No guarantees or warranties were given.  The patient is appropriate for an endoscopic procedure in  the ambulatory setting.   - Amada Jupiter, MD

## 2022-07-01 NOTE — Patient Instructions (Signed)
Handouts Provided:  Diverticulosis and Polyps  YOU HAD AN ENDOSCOPIC PROCEDURE TODAY AT THE [facility]:   Refer to the procedure report that was given to you for any specific questions about what was found during the examination.  If the procedure report does not answer your questions, please call your gastroenterologist to clarify.  If you requested that your care partner not be given the details of your procedure findings, then the procedure report has been included in a sealed envelope for you to review at your convenience later.  YOU SHOULD EXPECT: Some feelings of bloating in the abdomen. Passage of more gas than usual.  Walking can help get rid of the air that was put into your GI tract during the procedure and reduce the bloating. If you had a lower endoscopy (such as a colonoscopy or flexible sigmoidoscopy) you may notice spotting of blood in your stool or on the toilet paper. If you underwent a bowel prep for your procedure, you may not have a normal bowel movement for a few days.  Please Note:  You might notice some irritation and congestion in your nose or some drainage.  This is from the oxygen used during your procedure.  There is no need for concern and it should clear up in a day or so.  SYMPTOMS TO REPORT IMMEDIATELY:  Following lower endoscopy (colonoscopy or flexible sigmoidoscopy):  Excessive amounts of blood in the stool  Significant tenderness or worsening of abdominal pains  Swelling of the abdomen that is new, acute  Fever of 100F or higher  For urgent or emergent issues, a gastroenterologist can be reached at any hour by calling [phone]. Do not use MyChart messaging for urgent concerns.    DIET:  We do recommend a small meal at first, but then you may proceed to your regular diet.  Drink plenty of fluids but you should avoid alcoholic beverages for 24 hours.  ACTIVITY:  You should plan to take it easy for the rest of today and you should NOT DRIVE  or use heavy machinery until tomorrow (because of the sedation medicines used during the test).    FOLLOW UP: Our staff will call the number listed on your records the next business day following your procedure.  We will call around 7:15- 8:00 am to check on you and address any questions or concerns that you may have regarding the information given to you following your procedure. If we do not reach you, we will leave a message.     If any biopsies were taken you will be contacted by phone or by letter within the next 1-3 weeks.  Please call us at [phone] if you have not heard about the biopsies in 3 weeks.    SIGNATURES/CONFIDENTIALITY: You and/or your care partner have signed paperwork which will be entered into your electronic medical record.  These signatures attest to the fact that that the information above on your After Visit Summary has been reviewed and is understood.  Full responsibility of the confidentiality of this discharge information lies with you and/or your care-partner.  

## 2022-07-04 ENCOUNTER — Telehealth: Payer: Self-pay | Admitting: *Deleted

## 2022-07-04 NOTE — Telephone Encounter (Signed)
Attempted to call patient for their post-procedure follow-up call. No answer. Left voicemail.   

## 2022-07-05 ENCOUNTER — Encounter: Payer: Self-pay | Admitting: Gastroenterology

## 2022-07-11 ENCOUNTER — Other Ambulatory Visit: Payer: Self-pay | Admitting: Emergency Medicine

## 2022-07-11 DIAGNOSIS — I10 Essential (primary) hypertension: Secondary | ICD-10-CM

## 2022-08-16 ENCOUNTER — Ambulatory Visit: Payer: 59 | Admitting: Cardiology

## 2022-09-21 ENCOUNTER — Other Ambulatory Visit: Payer: Self-pay | Admitting: Emergency Medicine

## 2022-09-21 DIAGNOSIS — G894 Chronic pain syndrome: Secondary | ICD-10-CM

## 2022-12-26 ENCOUNTER — Other Ambulatory Visit: Payer: Self-pay | Admitting: Emergency Medicine

## 2022-12-26 DIAGNOSIS — I10 Essential (primary) hypertension: Secondary | ICD-10-CM

## 2023-03-24 ENCOUNTER — Other Ambulatory Visit: Payer: Self-pay | Admitting: Emergency Medicine

## 2023-03-24 DIAGNOSIS — G894 Chronic pain syndrome: Secondary | ICD-10-CM

## 2023-03-24 NOTE — Telephone Encounter (Signed)
 Last Fill: 09/22/22  Last OV: 05/16/22 Next OV: None Scheduled  Routing to provider for review/authorization.

## 2023-03-24 NOTE — Telephone Encounter (Signed)
 Copied from CRM [id number]. Topic: Clinical - Medication Refill >> Mar 24, 2023 11:16 AM Hector Shade B wrote: Most Recent Primary Care Visit:  Provider: Georgina Quint  Department: [facility]  Visit Type: PHYSICAL  Date: 05/16/2022  Medication:  gabapentin (NEURONTIN) 300 MG capsule  Has the patient contacted their pharmacy? Yes (Agent: If no, request that the patient contact the pharmacy for the refill. If patient does not wish to contact the pharmacy document the reason why and proceed with request.) (Agent: If yes, when and what did the pharmacy advise?)Patient was advised to call provider  Is this the correct pharmacy for this prescription? Yes If no, delete pharmacy and type the correct one.  This is the patient's preferred pharmacy:   [id number] [place], [place] - [address] AT [facility] [address] [place] [address] Phone: [phone] Fax: [phone]   Has the prescription been filled recently? Yes  Is the patient out of the medication? Has only two days left   Has the patient been seen for an appointment in the last year OR does the patient have an upcoming appointment? Yes  Can we respond through MyChart? Yes  Agent: Please be advised that Rx refills may take up to 3 business days. We ask that you follow-up with your pharmacy.

## 2023-03-28 MED ORDER — GABAPENTIN 300 MG PO CAPS
ORAL_CAPSULE | ORAL | 1 refills | Status: DC
Start: 2023-03-28 — End: 2023-09-18

## 2023-07-12 ENCOUNTER — Encounter: Payer: Self-pay | Admitting: Emergency Medicine

## 2023-07-12 ENCOUNTER — Ambulatory Visit: Admitting: Emergency Medicine

## 2023-07-12 VITALS — BP 138/96 | HR 102 | Temp 98.7°F | Ht 71.0 in | Wt 230.0 lb

## 2023-07-12 DIAGNOSIS — J029 Acute pharyngitis, unspecified: Secondary | ICD-10-CM | POA: Diagnosis not present

## 2023-07-12 DIAGNOSIS — R051 Acute cough: Secondary | ICD-10-CM | POA: Insufficient documentation

## 2023-07-12 DIAGNOSIS — I10 Essential (primary) hypertension: Secondary | ICD-10-CM

## 2023-07-12 DIAGNOSIS — R6889 Other general symptoms and signs: Secondary | ICD-10-CM

## 2023-07-12 MED ORDER — AZITHROMYCIN 250 MG PO TABS: ORAL_TABLET | ORAL | 0 refills | Status: DC

## 2023-07-12 MED ORDER — BENZONATATE 200 MG PO CAPS: 200.0000 mg | ORAL_CAPSULE | Freq: Two times a day (BID) | ORAL | 0 refills | Status: DC | PRN

## 2023-07-12 MED ORDER — HYDROCODONE BIT-HOMATROP MBR 5-1.5 MG/5ML PO SOLN: 5.0000 mL | Freq: Every evening | ORAL | 0 refills | Status: DC | PRN

## 2023-07-12 NOTE — Assessment & Plan Note (Signed)
 Symptom management discussed. Advised to rest and stay well-hydrated Advised to take Tylenol  and or Advil  as needed.

## 2023-07-12 NOTE — Patient Instructions (Signed)

## 2023-07-12 NOTE — Assessment & Plan Note (Signed)
 Cough management discussed. Recommend over-the-counter Mucinex  DM and cough drops Tessalon 200 mg 3 times a day and Hycodan syrup at bedtime Advised to rest and stay well-hydrated.

## 2023-07-12 NOTE — Assessment & Plan Note (Signed)
 BP Readings from Last 3 Encounters:  07/12/23 (!) 138/96  07/01/22 128/86  06/28/22 (!) 130/100  Well-controlled hypertension with normal blood pressure readings at home Continue amlodipine  5 mg daily Cardiovascular risk associated with hypertension discussed Diet and nutrition discussed.

## 2023-07-12 NOTE — Assessment & Plan Note (Signed)
 Symptom management discussed Recommend to start azithromycin  daily for 5 days Advised to rest and stay well-hydrated Advised to contact the office if no better or worse during the next several days.

## 2023-07-12 NOTE — Progress Notes (Signed)
 Billy Bray 53 y.o.   Chief Complaint  Patient presents with   Cough    Patient here for a cough and throat pain w/ visible yellow lumps at the back of the throat, some pain when swallowing. Has been going on for about a week. No body aches, no fever. Patient does mention in between his shoulders he been having some tightness since the symptoms started.He is taking sudafed and has not helped.    HISTORY OF PRESENT ILLNESS: Acute problem visit today. This is a 53 y.o. male complaining of flulike symptoms that started about 1 week ago progressively getting worse. Complaining of cough and throat pain with painful swallowing.  No other associated symptoms No other complaints or medical concerns today.  Cough Associated symptoms include a sore throat. Pertinent negatives include no chest pain, chills, fever or headaches.     Prior to Admission medications   Medication Sig Start Date End Date Taking? Authorizing Provider  albuterol  (PROVENTIL ) (2.5 MG/3ML) 0.083% nebulizer solution Take 3 mLs (2.5 mg total) by nebulization every 6 (six) hours as needed for wheezing or shortness of breath. 04/17/15  Yes Calone, Gregory D, FNP  albuterol  (VENTOLIN  HFA) 108 (90 Base) MCG/ACT inhaler Inhale 1-2 puffs into the lungs every 6 (six) hours as needed for wheezing or shortness of breath. 07/06/19  Yes Alexandr Oehler, Isidro Margo, MD  amLODipine  (NORVASC ) 5 MG tablet TAKE 1 TABLET BY MOUTH EVERY DAY 12/27/22  Yes Jakalyn Kratky, Isidro Margo, MD  Amphetamine  ER (ADZENYS  XR-ODT) 18.8 MG TBED Take by mouth daily.   Yes [provider]  amphetamine -dextroamphetamine  (ADDERALL) 30 MG tablet Take 1 tablet by mouth daily. 06/02/22  Yes [provider]  cetirizine (ZYRTEC) 5 MG tablet Take 5 mg by mouth daily.   Yes [provider]  gabapentin  (NEURONTIN ) 300 MG capsule TAKE ONE CAPSULE IN AM, EARLY AFTERNOON, AND THREE CAPSULES AT BEDTIME. 03/28/23  Yes Brysen Shankman, Isidro Margo, MD  ketoconazole  (NIZORAL )  2 % cream APPLY TO AFFECTED AREA EVERY DAY 08/26/19  Yes Samatha Anspach, Isidro Margo, MD  pantoprazole  (PROTONIX ) 40 MG tablet TAKE 1 TABLET BY MOUTH EVERY DAY 01/10/22  Yes Elvira Hammersmith, MD  triamcinolone  cream (KENALOG ) 0.1 % APPLY TO AFFECTED AREA TWICE A DAY 07/09/20  Yes Elvira Hammersmith, MD    Allergies  Allergen Reactions   Amoxicillin Hives    Patient Active Problem List   Diagnosis Date Noted   Essential hypertension 06/27/2018   Nonspecific chest pain 03/26/2018   Chronic pain syndrome 08/31/2017   Degeneration of intervertebral disc of cervical region 09/13/2016   TMJ dysfunction 08/08/2016   Cervical spondylolysis 08/05/2016   Polyarthralgia 05/24/2016   Asthma 09/14/2015   Secondary male hypogonadism 06/17/2015   ADD (attention deficit disorder) 09/02/2014   Neuropathy 09/02/2014    Past Medical History:  Diagnosis Date   ADD (attention deficit disorder)    Allergy    Arthritis    Asthma    Cervical spondylolysis    Chronic pain syndrome    DDD (degenerative disc disease), cervical    Depression    Hypertension    Kidney stones    Male hypogonadism    Neuropathy    Polyarthralgia    TMJ dysfunction     Past Surgical History:  Procedure Laterality Date   BILATERAL CARPAL TUNNEL RELEASE  2004   CHEST TUBE INSERTION      Social History   Socioeconomic History   Marital status: Divorced    Spouse name:  Not on file   Number of children: 2   Years of education: 14   Highest education level: Not on file  Occupational History   Occupation: Personnel officer  Tobacco Use   Smoking status: Former   Smokeless tobacco: Never  Advertising account planner   Vaping status: Never Used  Substance and Sexual Activity   Alcohol use: No   Drug use: Yes    Types: Marijuana   Sexual activity: Not Currently  Other Topics Concern   Not on file  Social History Narrative   Fun: Entertainment sounds/lights for concerts,   Denies religious beliefs effecting health care.     Social Drivers of Corporate investment banker Strain: Not on file  Food Insecurity: Not on file  Transportation Needs: Not on file  Physical Activity: Not on file  Stress: Not on file  Social Connections: Not on file  Intimate Partner Violence: Not on file    Family History  Problem Relation Age of Onset   Healthy Mother    CVA Father    Stroke Maternal Grandmother    Diabetes Maternal Grandmother    Stroke Maternal Grandfather    Diabetes Maternal Grandfather    Colon cancer Paternal Grandfather    Rectal cancer Neg Hx    Stomach cancer Neg Hx      Review of Systems  Constitutional: Negative.  Negative for chills and fever.  HENT:  Positive for congestion and sore throat.   Respiratory:  Positive for cough.   Cardiovascular: Negative.  Negative for chest pain and palpitations.  Gastrointestinal:  Negative for abdominal pain, diarrhea, nausea and vomiting.  Genitourinary: Negative.  Negative for dysuria and hematuria.  Neurological: Negative.  Negative for dizziness and headaches.  All other systems reviewed and are negative.   Vitals:   07/12/23 1425  BP: (!) 138/96  Pulse: (!) 102  Temp: 98.7 F (37.1 C)  SpO2: 96%    Physical Exam Vitals reviewed.  Constitutional:      Appearance: Normal appearance.  HENT:     Head: Normocephalic.     Mouth/Throat:     Pharynx: Posterior oropharyngeal erythema present. No oropharyngeal exudate.   Eyes:     Extraocular Movements: Extraocular movements intact.     Pupils: Pupils are equal, round, and reactive to light.    Cardiovascular:     Rate and Rhythm: Normal rate and regular rhythm.     Pulses: Normal pulses.     Heart sounds: Normal heart sounds.  Pulmonary:     Effort: Pulmonary effort is normal.     Breath sounds: Normal breath sounds.   Musculoskeletal:     Cervical back: No tenderness.  Lymphadenopathy:     Cervical: Cervical adenopathy present.   Skin:    General: Skin is warm and dry.    Neurological:     General: No focal deficit present.     Mental Status: He is alert and oriented to person, place, and time.   Psychiatric:        Mood and Affect: Mood normal.        Behavior: Behavior normal.      ASSESSMENT & PLAN: A total of 34 minutes was spent with the patient and counseling/coordination of care regarding preparing for this visit, review of most recent office visit notes, review of multiple chronic medical conditions and their management, diagnosis of acute pharyngitis and need for antibiotic, symptom management, review of all medications, review of most recent bloodwork results, review of health  maintenance items, education on nutrition, prognosis, documentation, and need for follow up.   Problem List Items Addressed This Visit       Cardiovascular and Mediastinum   Essential hypertension   BP Readings from Last 3 Encounters:  07/12/23 (!) 138/96  07/01/22 128/86  06/28/22 (!) 130/100  Well-controlled hypertension with normal blood pressure readings at home Continue amlodipine  5 mg daily Cardiovascular risk associated with hypertension discussed Diet and nutrition discussed.         Respiratory   Acute pharyngitis - Primary   Symptom management discussed Recommend to start azithromycin  daily for 5 days Advised to rest and stay well-hydrated Advised to contact the office if no better or worse during the next several days.      Relevant Medications   azithromycin  (ZITHROMAX ) 250 MG tablet     Other   Acute cough   Cough management discussed. Recommend over-the-counter Mucinex  DM and cough drops Tessalon 200 mg 3 times a day and Hycodan syrup at bedtime Advised to rest and stay well-hydrated.      Relevant Medications   benzonatate (TESSALON) 200 MG capsule   HYDROcodone bit-homatropine (HYCODAN) 5-1.5 MG/5ML syrup   Flu-like symptoms   Symptom management discussed. Advised to rest and stay well-hydrated Advised to take Tylenol  and or  Advil  as needed.      Patient Instructions  Cough, Adult A cough helps to clear your throat and lungs. It may be a sign of an illness or another condition. A short-term (acute) cough may last 2-3 weeks. A long-term (chronic) cough may last 8 or more weeks. Many things can cause a cough. They include: Illnesses such as: An infection in your throat or lungs. Asthma or other heart or lung problems. Gastroesophageal reflux. This is when acid comes back up from your stomach. Breathing in things that bother (irritate) your lungs. Allergies. Postnasal drip. This is when mucus runs down the back of your throat. Smoking. Some medicines. Follow these instructions at home: Medicines Take over-the-counter and prescription medicines only as told by your doctor. Talk with your doctor before you take cough medicine (cough suppressants). Eating and drinking Do not drink alcohol. Do not drink caffeine. Drink enough fluid to keep your pee (urine) pale yellow. Lifestyle Stay away from cigarette smoke. Do not smoke or use any products that contain nicotine or tobacco. If you need help quitting, ask your doctor. Stay away from things that make you cough. These may include perfume, candles, cleaning products, or campfire smoke. General instructions  Watch for any changes to your cough. Tell your doctor about them. Always cover your mouth when you cough. If the air is dry in your home, use a cool mist vaporizer or humidifier. If your cough is worse at night, try using extra pillows to raise your head up higher while you sleep. Rest as needed. Contact a doctor if: You have new symptoms. Your symptoms get worse. You cough up pus. You have a fever that does not go away. Your cough does not get better after 2-3 weeks. Cough medicine does not help, and you are not sleeping well. You have pain that gets worse or is not helped with medicine. You are losing weight and do not know why. You have night  sweats. Get help right away if: You cough up blood. You have trouble breathing. Your heart is beating very fast. These symptoms may be an emergency. Get help right away. Call 911. Do not wait to see if the symptoms will  go away. Do not drive yourself to the hospital. This information is not intended to replace advice given to you by your health care provider. Make sure you discuss any questions you have with your health care provider. Document Revised: 09/10/2021 Document Reviewed: 09/10/2021 Elsevier Patient Education  2024 Elsevier Inc.       Maryagnes Small, MD [facility]

## 2023-07-24 ENCOUNTER — Other Ambulatory Visit: Payer: Self-pay | Admitting: Emergency Medicine

## 2023-07-24 DIAGNOSIS — R12 Heartburn: Secondary | ICD-10-CM

## 2023-07-24 MED ORDER — PANTOPRAZOLE SODIUM 40 MG PO TBEC: 40.0000 mg | DELAYED_RELEASE_TABLET | Freq: Every day | ORAL | 3 refills | Status: AC

## 2023-07-24 NOTE — Telephone Encounter (Signed)
 Copied from CRM [id number]. Topic: Clinical - Medication Refill >> Jul 24, 2023 11:13 AM Burnard DEL wrote: Medication: pantoprazole  (PROTONIX ) 40 MG tablet  Has the patient contacted their pharmacy? Yes (Agent: If no, request that the patient contact the pharmacy for the refill. If patient does not wish to contact the pharmacy document the reason why and proceed with request.) (Agent: If yes, when and what did the pharmacy advise?)  This is the patient's preferred pharmacy:  [id number] [place], [place] - [address] AT [facility] [address]  [place] [address] Phone: [phone] Fax: [phone]  Is this the correct pharmacy for this prescription? Yes If no, delete pharmacy and type the correct one.   Has the prescription been filled recently? No  Is the patient out of the medication? Yes  Has the patient been seen for an appointment in the last year OR does the patient have an upcoming appointment? Yes  Can we respond through MyChart? Yes  Agent: Please be advised that Rx refills may take up to 3 business days. We ask that you follow-up with your pharmacy.

## 2023-08-16 ENCOUNTER — Other Ambulatory Visit: Payer: Self-pay | Admitting: Radiology

## 2023-08-16 DIAGNOSIS — I10 Essential (primary) hypertension: Secondary | ICD-10-CM

## 2023-08-16 MED ORDER — AMLODIPINE BESYLATE 5 MG PO TABS: 5.0000 mg | ORAL_TABLET | Freq: Every day | ORAL | 0 refills | Status: DC

## 2023-08-22 ENCOUNTER — Ambulatory Visit: Admitting: Emergency Medicine

## 2023-09-16 ENCOUNTER — Other Ambulatory Visit: Payer: Self-pay | Admitting: Emergency Medicine

## 2023-09-16 DIAGNOSIS — G894 Chronic pain syndrome: Secondary | ICD-10-CM

## 2023-09-28 ENCOUNTER — Ambulatory Visit: Payer: Self-pay | Admitting: Emergency Medicine

## 2023-09-28 ENCOUNTER — Ambulatory Visit (INDEPENDENT_AMBULATORY_CARE_PROVIDER_SITE_OTHER)

## 2023-09-28 ENCOUNTER — Encounter: Payer: Self-pay | Admitting: Emergency Medicine

## 2023-09-28 ENCOUNTER — Ambulatory Visit: Admitting: Emergency Medicine

## 2023-09-28 VITALS — BP 134/90 | HR 107 | Temp 98.3°F | Ht 71.0 in | Wt 234.0 lb

## 2023-09-28 DIAGNOSIS — M25511 Pain in right shoulder: Secondary | ICD-10-CM

## 2023-09-28 MED ORDER — MELOXICAM 15 MG PO TABS
15.0000 mg | ORAL_TABLET | Freq: Every day | ORAL | 0 refills | Status: AC
Start: 2023-09-28 — End: 2023-10-08

## 2023-09-28 MED ORDER — TRAMADOL HCL 50 MG PO TABS: 50.0000 mg | ORAL_TABLET | Freq: Three times a day (TID) | ORAL | 1 refills | Status: AC | PRN

## 2023-09-28 NOTE — Assessment & Plan Note (Signed)
 Chronic injury with acute exacerbation Recommend x-ray today.  Will review images when available Pain management discussed Recommend meloxicam  50 mg daily for 10 days Tylenol  for mild to moderate pain and tramadol  for moderate to severe pain Needs orthopedic evaluation Referral placed today

## 2023-09-28 NOTE — Progress Notes (Signed)
 Billy Bray 53 y.o.   Chief Complaint  Patient presents with   Shoulder Pain    Patient states his right shoulder has been an issue for over 15 years but has gotten worse within the last couple of weeks. Patient is only taking ibuprofen . Most painful with movement, states it is warm to the touch, and clicking pain.     HISTORY OF PRESENT ILLNESS: This is a 53 y.o. male complaining of pain to right shoulder for the last couple weeks Has history of injury to same shoulder about 15 years ago No other complaints or medical concerns today.  Shoulder Pain  Pertinent negatives include no fever.     Prior to Admission medications   Medication Sig Start Date End Date Taking? Authorizing Provider  albuterol  (PROVENTIL ) (2.5 MG/3ML) 0.083% nebulizer solution Take 3 mLs (2.5 mg total) by nebulization every 6 (six) hours as needed for wheezing or shortness of breath. 04/17/15  Yes Calone, Gregory D, FNP  albuterol  (VENTOLIN  HFA) 108 (90 Base) MCG/ACT inhaler Inhale 1-2 puffs into the lungs every 6 (six) hours as needed for wheezing or shortness of breath. 07/06/19  Yes Parker Wherley, Emil Schanz, MD  amLODipine  (NORVASC ) 5 MG tablet Take 1 tablet (5 mg total) by mouth daily. 08/16/23  Yes Jadie Comas, Emil Schanz, MD  Amphetamine  ER (ADZENYS  XR-ODT) 18.8 MG TBED Take by mouth daily.   Yes [provider]  amphetamine -dextroamphetamine  (ADDERALL) 30 MG tablet Take 1 tablet by mouth daily. 06/02/22  Yes [provider]  cetirizine (ZYRTEC) 5 MG tablet Take 5 mg by mouth daily.   Yes [provider]  gabapentin  (NEURONTIN ) 300 MG capsule TAKE ONE CAPSULE IN AM, EARLY AFTERNOON, AND THREE CAPSULES AT BEDTIME. 09/18/23  Yes Kaliel Bolds, Emil Schanz, MD  ketoconazole  (NIZORAL ) 2 % cream APPLY TO AFFECTED AREA EVERY DAY 08/26/19  Yes Jaedin Regina, Emil Schanz, MD  meloxicam  (MOBIC ) 15 MG tablet Take 1 tablet (15 mg total) by mouth daily for 10 days. 09/28/23 10/08/23 Yes Reann Dobias, Emil Schanz, MD   pantoprazole  (PROTONIX ) 40 MG tablet Take 1 tablet (40 mg total) by mouth daily. 07/24/23  Yes Timberlynn Kizziah, Emil Schanz, MD  traMADol  (ULTRAM ) 50 MG tablet Take 1 tablet (50 mg total) by mouth every 8 (eight) hours as needed for up to 5 days. 09/28/23 10/03/23 Yes Harvin Konicek, Emil Schanz, MD  triamcinolone  cream (KENALOG ) 0.1 % APPLY TO AFFECTED AREA TWICE A DAY 07/09/20  Yes Ashlee Bewley, Emil Schanz, MD  azithromycin  (ZITHROMAX ) 250 MG tablet Sig as indicated Patient not taking: Reported on 09/28/2023 07/12/23   Cherly Erno Jose, MD  benzonatate  (TESSALON ) 200 MG capsule Take 1 capsule (200 mg total) by mouth 2 (two) times daily as needed for cough. Patient not taking: Reported on 09/28/2023 07/12/23   Purcell Emil Schanz, MD  HYDROcodone  bit-homatropine [facility] 5-1.5 MG/5ML syrup Take 5 mLs by mouth at bedtime as needed for cough. Patient not taking: Reported on 09/28/2023 07/12/23   Purcell Emil Schanz, MD    Allergies  Allergen Reactions   Amoxicillin Hives    Patient Active Problem List   Diagnosis Date Noted   Acute pharyngitis 07/12/2023   Acute cough 07/12/2023   Flu-like symptoms 07/12/2023   Essential hypertension 06/27/2018   Chronic pain syndrome 08/31/2017   Degeneration of intervertebral disc of cervical region 09/13/2016   TMJ dysfunction 08/08/2016   Cervical spondylolysis 08/05/2016   Polyarthralgia 05/24/2016   Asthma 09/14/2015   Secondary male hypogonadism 06/17/2015   ADD (attention deficit disorder) 09/02/2014  Neuropathy 09/02/2014    Past Medical History:  Diagnosis Date   ADD (attention deficit disorder)    Allergy    Arthritis    Asthma    Cervical spondylolysis    Chronic pain syndrome    DDD (degenerative disc disease), cervical    Depression    Hypertension    Kidney stones    Male hypogonadism    Neuropathy    Polyarthralgia    TMJ dysfunction     Past Surgical History:  Procedure Laterality Date   BILATERAL CARPAL TUNNEL RELEASE  2004    CHEST TUBE INSERTION      Social History   Socioeconomic History   Marital status: Divorced    Spouse name: Not on file   Number of children: 2   Years of education: 14   Highest education level: Not on file  Occupational History   Occupation: Personnel officer  Tobacco Use   Smoking status: Former   Smokeless tobacco: Never  Advertising account planner   Vaping status: Never Used  Substance and Sexual Activity   Alcohol use: No   Drug use: Yes    Types: Marijuana   Sexual activity: Not Currently  Other Topics Concern   Not on file  Social History Narrative   Fun: Entertainment sounds/lights for concerts,   Denies religious beliefs effecting health care.    Social Drivers of Corporate investment banker Strain: Not on file  Food Insecurity: Not on file  Transportation Needs: Not on file  Physical Activity: Not on file  Stress: Not on file  Social Connections: Not on file  Intimate Partner Violence: Not on file    Family History  Problem Relation Age of Onset   Healthy Mother    CVA Father    Stroke Maternal Grandmother    Diabetes Maternal Grandmother    Stroke Maternal Grandfather    Diabetes Maternal Grandfather    Colon cancer Paternal Grandfather    Rectal cancer Neg Hx    Stomach cancer Neg Hx      Review of Systems  Constitutional: Negative.  Negative for chills and fever.  HENT: Negative.  Negative for congestion and sore throat.   Respiratory: Negative.  Negative for cough and shortness of breath.   Cardiovascular: Negative.  Negative for chest pain and palpitations.  Gastrointestinal:  Negative for abdominal pain, diarrhea, nausea and vomiting.  Genitourinary: Negative.  Negative for dysuria and hematuria.  Musculoskeletal:  Positive for joint pain (Right shoulder).  Skin: Negative.  Negative for rash.  Neurological: Negative.  Negative for dizziness and headaches.  All other systems reviewed and are negative.   Vitals:   09/28/23 1543  BP: (!) 134/90  Pulse:  (!) 107  Temp: 98.3 F (36.8 C)  SpO2: 96%    Physical Exam Vitals reviewed.  Constitutional:      Appearance: Normal appearance.  HENT:     Head: Normocephalic.  Eyes:     Extraocular Movements: Extraocular movements intact.  Cardiovascular:     Rate and Rhythm: Normal rate.  Pulmonary:     Effort: Pulmonary effort is normal.  Musculoskeletal:     Comments: Right shoulder: Limited range of motion due to pain Right upper extremity: Neurovascularly intact  Skin:    General: Skin is warm and dry.  Neurological:     General: No focal deficit present.     Mental Status: He is alert and oriented to person, place, and time.  Psychiatric:  Mood and Affect: Mood normal.        Behavior: Behavior normal.   DG Shoulder Right Result Date: 09/28/2023 CLINICAL DATA:  Right shoulder pain. EXAM: RIGHT SHOULDER - 2+ VIEW COMPARISON:  05/08/2019 FINDINGS: There is no evidence of fracture or dislocation. Mild to moderate acromioclavicular degenerative change. The glenohumeral joint is normal. No evidence of focal bone lesion or erosion. No soft tissue calcifications. Soft tissues are unremarkable. IMPRESSION: Mild to moderate acromioclavicular degenerative change. Electronically Signed   By: Andrea Gasman M.D.   On: 09/28/2023 16:17      ASSESSMENT & PLAN: Problem List Items Addressed This Visit       Other   Acute pain of right shoulder - Primary   Chronic injury with acute exacerbation Recommend x-ray today.  Will review images when available Pain management discussed Recommend meloxicam  50 mg daily for 10 days Tylenol  for mild to moderate pain and tramadol  for moderate to severe pain Needs orthopedic evaluation Referral placed today      Relevant Medications   meloxicam  (MOBIC ) 15 MG tablet   traMADol  (ULTRAM ) 50 MG tablet   Other Relevant Orders   Ambulatory referral to Orthopedic Surgery   DG Shoulder Right (Completed)   Patient Instructions  Shoulder Pain Many  things can cause shoulder pain, including: An injury. Moving the shoulder in the same way again and again (overuse). Joint pain (arthritis). Pain can come from: Swelling and irritation (inflammation) of any part of the shoulder. An injury to: The shoulder joint. Tissues that connect muscle to bone (tendons). Tissues that connect bones to each other (ligaments). Bones. Follow these instructions at home: Watch for changes in your symptoms. Let your doctor know about them. Follow these instructions to help with your pain. If you have a sling that can be taken off: Wear the sling as told by your doctor. Take it off only as told by your doctor. Check the skin around the sling every day. Tell your doctor if you see problems. Loosen the sling if your fingers: Tingle. Become numb. Become cold. Keep the sling clean. If the sling is not waterproof: Do not let it get wet. Take the sling off when you shower or bathe. Managing pain, stiffness, and swelling  If told, put ice on the painful area. Put ice in a plastic bag. Place a towel between your skin and the bag. Leave the ice on for 20 minutes, 2-3 times a day. Stop putting ice on if it does not help with the pain. If your skin turns bright red, take off the ice right away to prevent skin damage. The risk of damage is higher if you cannot feel pain, heat, or cold. Squeeze a soft ball or a foam pad as much as possible. This prevents swelling in the shoulder. It also helps to strengthen the arm. General instructions Take over-the-counter and prescription medicines only as told by your doctor. Keep all follow-up visits. This will help you avoid any type of permanent shoulder problems. Contact a doctor if: Your pain gets worse. Medicine does not help your pain. You have new pain in your arm, hand, or fingers. You loosen your sling and your arm, hand, or fingers: Tingle. Are numb. Are swollen. Get help right away if: Your arm, hand, or  fingers turn white or blue. This information is not intended to replace advice given to you by your health care provider. Make sure you discuss any questions you have with your health care provider. Document Revised:  08/13/2021 Document Reviewed: 08/13/2021 Elsevier Patient Education  2024 Elsevier Inc.     Emil Schaumann, MD [facility]

## 2023-09-28 NOTE — Patient Instructions (Signed)
 Shoulder Pain  Many things can cause shoulder pain, including:  An injury.  Moving the shoulder in the same way again and again (overuse).  Joint pain (arthritis).  Pain can come from:  Swelling and irritation (inflammation) of any part of the shoulder.  An injury to:  The shoulder joint.  Tissues that connect muscle to bone (tendons).  Tissues that connect bones to each other (ligaments).  Bones.  Follow these instructions at home:  Watch for changes in your symptoms. Let your doctor know about them. Follow these instructions to help with your pain.  If you have a sling that can be taken off:  Wear the sling as told by your doctor. Take it off only as told by your doctor.  Check the skin around the sling every day. Tell your doctor if you see problems.  Loosen the sling if your fingers:  Tingle.  Become numb.  Become cold.  Keep the sling clean.  If the sling is not waterproof:  Do not let it get wet.  Take the sling off when you shower or bathe.  Managing pain, stiffness, and swelling    If told, put ice on the painful area.  Put ice in a plastic bag.  Place a towel between your skin and the bag.  Leave the ice on for 20 minutes, 2-3 times a day. Stop putting ice on if it does not help with the pain.  If your skin turns bright red, take off the ice right away to prevent skin damage. The risk of damage is higher if you cannot feel pain, heat, or cold.  Squeeze a soft ball or a foam pad as much as possible. This prevents swelling in the shoulder. It also helps to strengthen the arm.  General instructions  Take over-the-counter and prescription medicines only as told by your doctor.  Keep all follow-up visits. This will help you avoid any type of permanent shoulder problems.  Contact a doctor if:  Your pain gets worse.  Medicine does not help your pain.  You have new pain in your arm, hand, or fingers.  You loosen your sling and your arm, hand, or fingers:  Tingle.  Are numb.  Are swollen.  Get help right away  if:  Your arm, hand, or fingers turn white or blue.  This information is not intended to replace advice given to you by your health care provider. Make sure you discuss any questions you have with your health care provider.  Document Revised: 08/13/2021 Document Reviewed: 08/13/2021  Elsevier Patient Education  2024 [employer].

## 2023-10-08 ENCOUNTER — Other Ambulatory Visit: Payer: Self-pay | Admitting: Emergency Medicine

## 2023-10-08 DIAGNOSIS — I10 Essential (primary) hypertension: Secondary | ICD-10-CM

## 2023-10-27 ENCOUNTER — Ambulatory Visit: Admitting: Sports Medicine

## 2023-10-27 ENCOUNTER — Encounter: Payer: Self-pay | Admitting: Sports Medicine

## 2023-10-27 DIAGNOSIS — M25511 Pain in right shoulder: Secondary | ICD-10-CM

## 2023-10-27 DIAGNOSIS — M25311 Other instability, right shoulder: Secondary | ICD-10-CM

## 2023-10-27 DIAGNOSIS — G8929 Other chronic pain: Secondary | ICD-10-CM

## 2023-10-27 DIAGNOSIS — M12811 Other specific arthropathies, not elsewhere classified, right shoulder: Secondary | ICD-10-CM

## 2023-10-27 MED ORDER — METHYLPREDNISOLONE 4 MG PO TBPK: ORAL_TABLET | ORAL | 0 refills | Status: DC
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
## 2023-11-01 ENCOUNTER — Encounter: Payer: Self-pay | Admitting: Sports Medicine

## 2023-11-08 ENCOUNTER — Ambulatory Visit
Admission: RE | Admit: 2023-11-08 | Discharge: 2023-11-08 | Disposition: A | Discharge status: Home / Self Care | Source: Ambulatory Visit | Attending: Sports Medicine | Admitting: Sports Medicine

## 2023-11-08 DIAGNOSIS — M25311 Other instability, right shoulder: Secondary | ICD-10-CM

## 2023-11-08 DIAGNOSIS — G8929 Other chronic pain: Secondary | ICD-10-CM

## 2023-11-08 DIAGNOSIS — M12811 Other specific arthropathies, not elsewhere classified, right shoulder: Secondary | ICD-10-CM

## 2023-11-08 MED ORDER — IOPAMIDOL (ISOVUE-M 200) INJECTION 41%
10.0000 mL | Freq: Once | INTRAMUSCULAR | Status: AC
Start: 2023-11-08 — End: 2023-11-08
  Administered 2023-11-08: 10 mL via INTRA_ARTICULAR

## 2023-11-10 ENCOUNTER — Ambulatory Visit: Payer: Self-pay | Admitting: Sports Medicine

## 2023-11-24 ENCOUNTER — Encounter: Payer: Self-pay | Admitting: Sports Medicine

## 2023-11-24 ENCOUNTER — Other Ambulatory Visit: Payer: Self-pay

## 2023-11-24 ENCOUNTER — Ambulatory Visit (INDEPENDENT_AMBULATORY_CARE_PROVIDER_SITE_OTHER): Admitting: Sports Medicine

## 2023-11-24 DIAGNOSIS — G8929 Other chronic pain: Secondary | ICD-10-CM | POA: Diagnosis not present

## 2023-11-24 DIAGNOSIS — M25511 Pain in right shoulder: Secondary | ICD-10-CM | POA: Diagnosis not present

## 2023-11-24 DIAGNOSIS — M19011 Primary osteoarthritis, right shoulder: Secondary | ICD-10-CM | POA: Diagnosis not present

## 2023-11-24 DIAGNOSIS — M25311 Other instability, right shoulder: Secondary | ICD-10-CM | POA: Diagnosis not present

## 2023-11-24 MED ORDER — METHYLPREDNISOLONE ACETATE 40 MG/ML IJ SUSP
40.0000 mg | INTRAMUSCULAR | Status: AC | PRN
  Administered 2023-11-24: 40 mg via INTRA_ARTICULAR

## 2023-11-24 MED ORDER — LIDOCAINE HCL 1 % IJ SOLN
0.5000 mL | INTRAMUSCULAR | Status: AC | PRN
  Administered 2023-11-24: .5 mL

## 2023-11-24 NOTE — Progress Notes (Signed)
 Billy Bray - 53 y.o. male MRN [id number]  Date of birth: 1970-02-13  Office Visit Note: Visit Date: 11/24/2023 PCP: Purcell Emil Schanz, MD Referred by: Purcell Emil Schanz, *  Subjective: Chief Complaint  Patient presents with   Right Shoulder - Follow-up   HPI: Billy Bray is a pleasant 53 y.o. male who presents today for follow-up of chronic right shoulder pain, here as well for MRI review.  Right shoulder - he has not had any change in his symptoms, still has rather bothersome right shoulder pain.  Does notice pain over the anterior aspect of his shoulder but also continues with clicking and popping within the shoulder joint with certain motions.  Pertinent ROS were reviewed with the patient and found to be negative unless otherwise specified above in HPI.   Assessment & Plan: Visit Diagnoses:  1. Chronic right shoulder pain   2. Osteoarthritis of right AC (acromioclavicular) joint   3. Instability of right shoulder joint    Plan: Impression is chronic right shoulder pain which I believe is twofold in nature.  He does have symptoms and physical examination of shoulder instability with apprehension as well as clunking with anterior/posterior translation.  MRI arthrogram was reviewed which shows mild labral blunting although no significant rotator cuff tearing.  MRI does notably show moderate/AC joint arthritic change with distal clavicle bony edema, concerning for OA degeneration versus osteolysis at the distal clavicle portion.  Through shared decision making, we did proceed with ultrasound-guided right AC joint injection for both diagnostic and therapeutic purposes.  If this significantly helps his pain, he will continue to target the [facility] joint and could potentially be a candidate for distal clavicle excision.  If for some reason this does not significantly help his pain and he is still dealing with popping/instability about the shoulder, we would likely consider referral to Dr.  Addie for additional treatments and possible surgical intervention.  Markese will let me know in 2 weeks via MyChart the degree of relief he receives and we will move forward from there depending on this update.  May continue with ice/heat Tylenol  as well as his meloxicam  for pain as needed.  Follow-up: Return for Will send me Mychart message with update (0-100%) in 2 weeks.   Meds & Orders: No orders of the defined types were placed in this encounter.   Orders Placed This Encounter  Procedures   Medium Joint Inj   US  Guided Needle Placement - No Linked Charges     Procedures: Medium Joint Inj: R acromioclavicular on 11/24/2023 9:19 AM Indications: pain Details: 25 G 1.5 in needle, ultrasound-guided anterior approach Medications: 0.5 mL lidocaine  1 %; 40 mg methylPREDNISolone  acetate 40 MG/ML  US -guided AC Joint injection, right shoulder After discussion on risks/benefits/indications, informed verbal consent was obtained. A timeout was then performed. The patient was seated in examination room. The area overlying the [facility] joint of the shoulder was prepped with Betadine and alcohol swab then utilizing ultrasound guidance, patient's AC joint was injected using a 25G, 1.5 needle with 0.5:1.69mL lidocaine :depomedrol of injectate via an out-of-plane, walk-down approach. Visualization of injectate flow was noted under ultrasound guidance. Patient tolerated the procedure well without immediate complications.   Procedure, treatment alternatives, risks and benefits explained, specific risks discussed. Consent was given by the patient. Immediately prior to procedure a time out was called to verify the correct patient, procedure, equipment, support staff and site/side marked as required. Patient was prepped and draped in the usual sterile fashion.  Clinical History: No specialty comments available.  He reports that he has quit smoking. He has never used smokeless tobacco. No results for input(s):  HGBA1C, LABURIC in the last 8760 hours.  Objective:  Physical Exam  Gen: Well-appearing, in no acute distress; non-toxic CV: Well-perfused. Warm.  Resp: Breathing unlabored on room air; no wheezing. Psych: Fluid speech in conversation; appropriate affect; normal thought process  Ortho Exam - Right shoulder: No significant redness swelling or effusion.  Positive TTP over the right AC joint.  There is still instability with reproducible clicking with anterior/posterior translation.  Imaging:  *I did review MRI-arthrogram of the right shoulder both independently and with the patient in the room today.  MR SHOULDER RIGHT W CONTRAST MR SHOULDER WITH IV CONTRAST RIGHT  COMPARISON: None.  CLINICAL HISTORY: Chronic right shoulder pain. Instability.  PULSE SEQUENCES: Ax PD FS, Sag T2 FS, Cor T1 & COR T2 FS following intra-articular placement of dilute gadolinium based contrast.  FINDINGS:  Bones: There is moderate AC joint arthrosis with reactive edema and mild widening of the AC joint. There is a small joint effusion. Erosive changes are seen in the distal clavicle. Correlation for symptoms of traumatic osteophyte lysis. Glenohumeral joint is unremarkable. There is no fracture.  Rotator cuff: There is no extravasation of contrast. There is mild insertional tendinosis of the supra and infraspinatus tendons. No significant partial or full-thickness tear is present. Subscapularis and teres minor tendons are intact. No fatty atrophy of the rotator cuff muscles.  Labrum and biceps tendon: Biceps tendon is intact. There is mild irregularity of the anterior labrum. No discrete labral tear is appreciated. There is a prominent simple labral foramen. Otherwise the superior anterior and posterior labrum are unremarkable.  IMPRESSION: Moderate AC joint edema and erosive change. Correlation for traumatic osteophyte lysis of the [facility] joint.  Mild insertional tendinosis of the rotator  cuff without significant rotator cuff pathology.  No significant labral or biceps tendon abnormality.  Electronically signed by: Norleen Satchel MD 11/09/2023 09:53 AM EDT RP Workstation: [id number]  Past Medical/Family/Surgical/Social History: Medications & Allergies reviewed per EMR, new medications updated. Patient Active Problem List   Diagnosis Date Noted   Flu-like symptoms 07/12/2023   Acute pain of right shoulder 05/08/2019   Essential hypertension 06/27/2018   Chronic pain syndrome 08/31/2017   Degeneration of intervertebral disc of cervical region 09/13/2016   TMJ dysfunction 08/08/2016   Cervical spondylolysis 08/05/2016   Polyarthralgia 05/24/2016   Asthma 09/14/2015   Secondary male hypogonadism 06/17/2015   ADD (attention deficit disorder) 09/02/2014   Neuropathy 09/02/2014   Past Medical History:  Diagnosis Date   ADD (attention deficit disorder)    Allergy    Arthritis    Asthma    Cervical spondylolysis    Chronic pain syndrome    DDD (degenerative disc disease), cervical    Depression    Hypertension    Kidney stones    Male hypogonadism    Neuropathy    Polyarthralgia    TMJ dysfunction    Family History  Problem Relation Age of Onset   Healthy Mother    CVA Father    Stroke Maternal Grandmother    Diabetes Maternal Grandmother    Stroke Maternal Grandfather    Diabetes Maternal Grandfather    Colon cancer Paternal Grandfather    Rectal cancer Neg Hx    Stomach cancer Neg Hx    Past Surgical History:  Procedure Laterality Date   BILATERAL CARPAL TUNNEL RELEASE  2004  CHEST TUBE INSERTION     Social History   Occupational History   Occupation: Personnel Officer  Tobacco Use   Smoking status: Former   Smokeless tobacco: Never  [employer]vertising Account [employer]   Vaping status: Never Used  Substance and Sexual Activity   Alcohol use: No   Drug use: Yes    Types: Marijuana   Sexual activity: Not Currently

## 2023-11-24 NOTE — Progress Notes (Signed)
 Patient denies any new pain or symptoms in the shoulder. He is here today for MRI review and possible AC-joint injection.

## 2023-11-27 ENCOUNTER — Encounter: Payer: Self-pay | Admitting: Radiology

## 2023-12-01 ENCOUNTER — Other Ambulatory Visit: Payer: Self-pay | Admitting: Emergency Medicine

## 2023-12-01 DIAGNOSIS — I10 Essential (primary) hypertension: Secondary | ICD-10-CM

## 2023-12-08 ENCOUNTER — Encounter: Payer: Self-pay | Admitting: Sports Medicine

## 2023-12-14 ENCOUNTER — Ambulatory Visit
Admission: EM | Admit: 2023-12-14 | Discharge: 2023-12-14 | Disposition: A | Discharge status: Home / Self Care | Attending: Emergency Medicine | Admitting: Emergency Medicine

## 2023-12-14 ENCOUNTER — Other Ambulatory Visit: Payer: Self-pay

## 2023-12-14 DIAGNOSIS — R202 Paresthesia of skin: Secondary | ICD-10-CM

## 2023-12-14 DIAGNOSIS — H60501 Unspecified acute noninfective otitis externa, right ear: Secondary | ICD-10-CM

## 2023-12-14 MED ORDER — OFLOXACIN 0.3 % OT SOLN: 3.0000 [drp] | Freq: Two times a day (BID) | OTIC | 0 refills | Status: AC

## 2023-12-14 NOTE — Discharge Instructions (Addendum)
 Ofloxacin  -- ear drops 3 drops in the right ear, 2 times daily, for 7 days Ibuprofen  and/or tylenol  for pain Salt water gargles, throat lozenges, throat spray Please return if symptoms not improving  For the finger tingling, it could be related to the tendonitis in your elbow. I recommend continue compression, elevation, ice. The ibuprofen  will also help any inflammation. Please follow up with Dr Burnetta if symptoms persist.  Any worsening symptoms please be seen!

## 2023-12-14 NOTE — ED Provider Notes (Signed)
 [facility]    CSN: [id number] Arrival date & time: 12/14/23  1550      History   Chief Complaint Chief Complaint  Patient presents with   Otalgia    Right ear pain, began this morning.    Sore Throat   Hand Tingling    HPI Billy Bray is a 53 y.o. male.  Right ear pain that started when waking this morning Feels like the ear is casing sore throat Pain is rated 7/10 currently Used ibuprofen   No fever. No muffled hearing or fullness. Denies ear trauma or water in the ear.   Also reporting left hand has felt tingling for a few days in the fingers. Intermittently.SABRA Font he has a cyst on the left wrist, and elbow tendonitis.  History of carpal tunnel release, neuropathy, polyarthralgia  Not having any tingling currently.   Past Medical History:  Diagnosis Date   ADD (attention deficit disorder)    Allergy    Arthritis    Asthma    Cervical spondylolysis    Chronic pain syndrome    DDD (degenerative disc disease), cervical    Depression    Hypertension    Kidney stones    Male hypogonadism    Neuropathy    Polyarthralgia    TMJ dysfunction     Patient Active Problem List   Diagnosis Date Noted   Flu-like symptoms 07/12/2023   Acute pain of right shoulder 05/08/2019   Essential hypertension 06/27/2018   Chronic pain syndrome 08/31/2017   Degeneration of intervertebral disc of cervical region 09/13/2016   TMJ dysfunction 08/08/2016   Cervical spondylolysis 08/05/2016   Polyarthralgia 05/24/2016   Asthma 09/14/2015   Secondary male hypogonadism 06/17/2015   ADD (attention deficit disorder) 09/02/2014   Neuropathy 09/02/2014    Past Surgical History:  Procedure Laterality Date   BILATERAL CARPAL TUNNEL RELEASE  2004   CHEST TUBE INSERTION         Home Medications    Prior to Admission medications   Medication Sig Start Date End Date Taking? Authorizing Provider  ofloxacin (FLOXIN) 0.3 % OTIC solution Place 3 drops into the right ear  2 (two) times daily for 7 days. 12/14/23 12/21/23 Yes Theophil Thivierge, Asberry, PA-C  albuterol  (PROVENTIL ) (2.5 MG/3ML) 0.083% nebulizer solution Take 3 mLs (2.5 mg total) by nebulization every 6 (six) hours as needed for wheezing or shortness of breath. 04/17/15   Calone, Gregory D, FNP  albuterol  (VENTOLIN  HFA) 108 (90 Base) MCG/ACT inhaler Inhale 1-2 puffs into the lungs every 6 (six) hours as needed for wheezing or shortness of breath. 07/06/19   Purcell Emil Schanz, MD  amLODipine  (NORVASC ) 5 MG tablet TAKE 1 TABLET (5 MG TOTAL) BY MOUTH DAILY. 12/01/23   Sagardia, Miguel Jose, MD  Amphetamine  ER (ADZENYS  XR-ODT) 18.8 MG TBED Take by mouth daily.    [provider]  amphetamine -dextroamphetamine  (ADDERALL) 30 MG tablet Take 1 tablet by mouth daily. 06/02/22   [provider]  cetirizine (ZYRTEC) 5 MG tablet Take 5 mg by mouth daily.    [provider]  gabapentin  (NEURONTIN ) 300 MG capsule TAKE ONE CAPSULE IN AM, EARLY AFTERNOON, AND THREE CAPSULES AT BEDTIME. 09/18/23   Purcell Emil Schanz, MD  ketoconazole  (NIZORAL ) 2 % cream APPLY TO AFFECTED AREA EVERY DAY 08/26/19   Purcell Emil Schanz, MD  methylPREDNISolone  (MEDROL  DOSEPAK) 4 MG TBPK tablet Take per packet instruction. Taper dosing. 10/27/23   Brooks, Dana, DO  pantoprazole  (PROTONIX ) 40 MG tablet  Take 1 tablet (40 mg total) by mouth daily. 07/24/23   Purcell Emil Schanz, MD  triamcinolone  cream (KENALOG ) 0.1 % APPLY TO AFFECTED AREA TWICE A DAY 07/09/20   Purcell Emil Schanz, MD    Family History Family History  Problem Relation Age of Onset   Healthy Mother    CVA Father    Stroke Maternal Grandmother    Diabetes Maternal Grandmother    Stroke Maternal Grandfather    Diabetes Maternal Grandfather    Colon cancer Paternal Grandfather    Rectal cancer Neg Hx    Stomach cancer Neg Hx     Social History Social History   Tobacco Use   Smoking status: Former   Smokeless tobacco: Never  Vaping Use   Vaping  status: Never Used  Substance Use Topics   Alcohol use: No   Drug use: Yes    Types: Marijuana     Allergies   Amoxicillin   Review of Systems Review of Systems As per HPI  Physical Exam Triage Vital Signs ED Triage Vitals  Encounter Vitals Group     BP 12/14/23 1600 (!) 153/98     Girls Systolic BP Percentile --      Girls Diastolic BP Percentile --      Boys Systolic BP Percentile --      Boys Diastolic BP Percentile --      Pulse Rate 12/14/23 1600 88     Resp 12/14/23 1600 15     Temp 12/14/23 1600 98.3 F (36.8 C)     Temp Source 12/14/23 1600 Oral     SpO2 12/14/23 1600 95 %     Weight 12/14/23 1610 233 lb 14.5 oz (106.1 kg)     Height 12/14/23 1610 5' 11 (1.803 m)     Head Circumference --      Peak Flow --      Pain Score 12/14/23 1610 7     Pain Loc --      Pain Education --      Exclude from Growth Chart --    No data found.  Updated Vital Signs BP (!) 153/98 (BP Location: Right Arm)   Pulse 88   Temp 98.3 F (36.8 C) (Oral)   Resp 15   Ht 5' 11 (1.803 m)   Wt 233 lb 14.5 oz (106.1 kg)   SpO2 95%   BMI 32.62 kg/m   Visual Acuity Right Eye Distance:   Left Eye Distance:   Bilateral Distance:    Right Eye Near:   Left Eye Near:    Bilateral Near:     Physical Exam Vitals and nursing note reviewed.  Constitutional:      General: He is not in acute distress. HENT:     Right Ear: Ear canal normal. Tenderness (tragal, minimal) present. Tympanic membrane is scarred.     Left Ear: Ear canal normal. Tympanic membrane is scarred.     Ears:     Comments: Scarred TMs, history of T tubes. Minimal tenderness with right tragal movement. No erythema, swelling, drainage, foreign body, wax.     Mouth/Throat:     Pharynx: Oropharynx is clear.  Cardiovascular:     Rate and Rhythm: Normal rate and regular rhythm.     Pulses: Normal pulses.  Pulmonary:     Effort: Pulmonary effort is normal.  Musculoskeletal:     Comments: Full ROM of upper  extremities including all fingers. No pain. No swelling or deformity. Distal sensation intact  and equal all finger tips. Cap refill 1 second. Radial pulse 2+  Skin:    General: Skin is warm and dry.     Capillary Refill: Capillary refill takes less than 2 seconds.  Neurological:     Mental Status: He is alert and oriented to person, place, and time.      UC Treatments / Results  Labs (all labs ordered are listed, but only abnormal results are displayed) Labs Reviewed - No data to display  EKG  Radiology No results found.  Procedures Procedures   Medications Ordered in UC Medications - No data to display  Initial Impression / Assessment and Plan / UC Course  I have reviewed the triage vital signs and the nursing notes.  Pertinent labs & imaging results that were available during my care of the patient were reviewed by me and considered in my medical decision making (see chart for details).  Right otalgia No TM infection on exam. Canal is well appearing, with minimal tragal tenderness on exam, treat for otitis externa. Ofloxacin ear drops. Advised other supportive care, return if needed.  Tingling of left hand fingers Intermittent. Not occurring now. Neurovascularly intact. May be related to the reported cyst in his wrist, or the elbow tendonitis. Advised supportive care, RICE therapy. Call his ortho specialist for follow up if symptoms continue. Return and ED precaution Agrees to plan  Final Clinical Impressions(s) / UC Diagnoses   Final diagnoses:  Acute otitis externa of right ear, unspecified type  Hand tingling     Discharge Instructions      Ofloxacin -- ear drops 3 drops in the right ear, 2 times daily, for 7 days Ibuprofen  and/or tylenol  for pain Salt water gargles, throat lozenges, throat spray Please return if symptoms not improving  For the finger tingling, it could be related to the tendonitis in your elbow. I recommend continue compression,  elevation, ice. The ibuprofen  will also help any inflammation. Please follow up with Dr Burnetta if symptoms persist.  Any worsening symptoms please be seen!     ED Prescriptions     Medication Sig Dispense Auth. Provider   ofloxacin (FLOXIN) 0.3 % OTIC solution Place 3 drops into the right ear 2 (two) times daily for 7 days. 5 mL Hayslee Casebolt, Asberry, PA-C      PDMP not reviewed this encounter.   Jeryl Asberry, PA-C 12/14/23 1649

## 2023-12-14 NOTE — ED Triage Notes (Addendum)
 Pt presents with a chief complaint of right ear pain. States his symptoms began today upon wakening. Pt reports the ear pain is beginning to make his throat sore and his head feel very warm. Currently rates overall ear pain a 7/10. OTC Ibuprofen  taken for symptoms with no improvement/pain relief. No fevers at home.    Pt also mentions left hand tingling for a couple of days. Per pt, he has a cyst on left wrist + may be undergoing sx on right shoulder.

## 2024-01-12 NOTE — Addendum Note (Signed)
 Encounter addended by: Isela Iha on: 01/12/2024 3:44 PM  Actions taken: Imaging Exam ended

## 2024-01-12 NOTE — Addendum Note (Signed)
 Encounter addended by: Isela Iha on: 01/12/2024 3:25 PM  Actions taken: Imaging Exam ended

## 2024-01-23 ENCOUNTER — Telehealth

## 2024-01-23 ENCOUNTER — Encounter

## 2024-01-23 DIAGNOSIS — J069 Acute upper respiratory infection, unspecified: Secondary | ICD-10-CM | POA: Diagnosis not present

## 2024-01-23 MED ORDER — IPRATROPIUM BROMIDE 0.03 % NA SOLN: 2.0000 | Freq: Two times a day (BID) | NASAL | 0 refills | Status: AC

## 2024-01-23 MED ORDER — PROMETHAZINE-DM 6.25-15 MG/5ML PO SYRP: 5.0000 mL | ORAL_SOLUTION | Freq: Four times a day (QID) | ORAL | 0 refills | Status: AC | PRN

## 2024-01-23 NOTE — Progress Notes (Signed)
 We are sorry you are not feeling well.  Here is how we plan to help!  Based on what you have shared with me, it looks like you may have a viral upper respiratory infection.  Upper respiratory infections are caused by a large number of viruses; however, rhinovirus is the most common cause. It may be reasonable to repeat a COVID/Flu test tomorrow just in case.  Symptoms vary from person to person, with common symptoms including sore throat, cough, and fatigue or lack of energy, and a feeling of general discomfort.  A low-grade fever of up to 100.4 may present, but is often uncommon.  Symptoms vary however, and are closely related to a person's age or underlying illnesses.  The most common symptoms associated with an upper respiratory infection are nasal discharge or congestion, cough, sneezing, headache and pressure in the ears and face.  These symptoms usually persist for about 3 to 10 days, but can last up to 2 weeks.  It is important to know that upper respiratory infections do not cause serious illness or complications in most cases.    Upper respiratory infections can be transmitted from person to person, with the most common method of transmission being a person's hands.  The virus is able to live on the skin and can infect other persons for up to 2 hours after direct contact.  Also, these can be transmitted when someone coughs or sneezes; thus, it is important to cover the mouth to reduce this risk.  To keep the spread of the illness at bay, good hand hygiene is very important!  Because this is a viral infection, there are no specific treatments other than to help you with the symptoms until the infection runs its course.    For nasal congestion, you may use an oral decongestants such as Mucinex  D or if you have glaucoma or high blood pressure use plain Mucinex .  Saline nasal spray or nasal drops can help and can safely be used as often as needed for congestion.  For your congestion, I have prescribed  Ipratropium Bromide nasal spray 0.03% two sprays in each nostril 2-3 times a day  If you do not have a history of heart disease, hypertension, diabetes or thyroid  disease, prostate/bladder issues or glaucoma, you may also use Sudafed to treat nasal congestion.  It is highly recommended that you consult with a pharmacist or your primary care physician to ensure this medication is safe for you to take.     If you have a cough, you may use over-the-counter cough suppressants such as Delsym and Robitussin.  If you have glaucoma or high blood pressure, you can also use Coricidin HBP.   For cough I have prescribed for you A prescription cough medication called Promethazine-DM 6.25-15 mg/5 mL. Take 5 mL every 6 hours as needed for cough.  If you have a sore or scratchy throat, use a saltwater gargle-  to  teaspoon of salt dissolved in a 4-ounce to 8-ounce glass of warm water.  Gargle the solution for approximately 15-30 seconds and then spit.  It is important not to swallow the solution.  You can also use throat lozenges/cough drops and Chloraseptic spray to help with throat pain or discomfort.  Warm or cold liquids can also be helpful in relieving throat pain.   For headache, pain, or general discomfort, you can use Ibuprofen  or Tylenol  as directed.   Some authorities believe that zinc sprays or the use of Echinacea may shorten the course  of your symptoms.   HOME CARE Only take medications as instructed by your medical team. Be sure to drink plenty of fluids. Water is fine as well as fruit juices, sodas and electrolyte beverages. You may want to stay away from caffeine or alcohol. If you are nauseated, try taking small sips of liquids. How do you know if you are getting enough fluid? Your urine should be a pale yellow or almost colorless. Get rest. Taking a steamy shower or using a humidifier may help nasal congestion and ease sore throat pain. You can place a towel over your head and breathe in the  steam from hot water coming from a faucet. Using a saline nasal spray works much the same way. Cough drops, hard candies and sore throat lozenges may ease your cough. Avoid close contacts especially the very young and the elderly Cover your mouth if you cough or sneeze Always remember to wash your hands.   GET HELP RIGHT AWAY IF: You develop worsening fever. If your symptoms do not improve within 10 days You develop yellow or green discharge from your nose over 3 days. You have coughing fits You develop a severe head ache or visual changes. You develop shortness of breath, difficulty breathing or start having chest pain Your symptoms persist after you have completed your treatment plan  MAKE SURE YOU  Understand these instructions. Will watch your condition. Will get help right away if you are not doing well or get worse.  Your e-visit answers were reviewed by a board certified advanced clinical practitioner to complete your personal care plan. Depending upon the condition, your plan could have included both over-the-counter or prescription medications.  Please review your pharmacy choice. If there is a problem, you may call our nursing hot line at and have the prescription routed to another pharmacy. Your safety is important to us . If you have drug allergies, check your prescription carefully.   You can use MyChart to ask questions about todays visit, request a non-urgent call back, or ask for a work or school excuse for 24 hours related to this e-Visit. If it has been greater than 24 hours you will need to follow up with your provider, or enter a new e-Visit to address those concerns. You will get an e-mail in the next two days asking about your experience.  I hope that your e-visit has been valuable and will speed your recovery. Thank you for using e-visits.   I have spent 5 minutes in review of e-visit questionnaire, review and updating patient chart, medical decision making and  response to patient.   Elsie Velma Lunger, PA-C

## 2024-01-30 ENCOUNTER — Telehealth: Admitting: Family Medicine

## 2024-01-30 DIAGNOSIS — R058 Other specified cough: Secondary | ICD-10-CM

## 2024-01-30 NOTE — Progress Notes (Signed)
 Billy Bray,   We can not refill the cough syrup as you just had it, it might be best to have you seen in person given your symptoms are worsening. You will not be charged for this EV.   NOTE: There will be NO CHARGE for this E-Visit   If you are having a true medical emergency, please call 911.

## 2024-02-01 ENCOUNTER — Ambulatory Visit: Admitting: Emergency Medicine

## 2024-02-01 ENCOUNTER — Encounter: Payer: Self-pay | Admitting: Emergency Medicine

## 2024-02-01 VITALS — BP 120/70 | HR 100 | Temp 98.2°F | Ht 71.0 in | Wt 234.0 lb

## 2024-02-01 DIAGNOSIS — R0989 Other specified symptoms and signs involving the circulatory and respiratory systems: Secondary | ICD-10-CM | POA: Diagnosis not present

## 2024-02-01 DIAGNOSIS — R6889 Other general symptoms and signs: Secondary | ICD-10-CM

## 2024-02-01 DIAGNOSIS — J22 Unspecified acute lower respiratory infection: Secondary | ICD-10-CM | POA: Insufficient documentation

## 2024-02-01 LAB — POCT INFLUENZA A/B
Influenza A, POC: NEGATIVE
Influenza B, POC: NEGATIVE

## 2024-02-01 LAB — POC COVID19 BINAXNOW: SARS Coronavirus 2 Ag: NEGATIVE

## 2024-02-01 MED ORDER — AZITHROMYCIN 250 MG PO TABS: ORAL_TABLET | ORAL | 0 refills | Status: AC

## 2024-02-01 MED ORDER — HYDROCODONE BIT-HOMATROP MBR 5-1.5 MG/5ML PO SOLN: 5.0000 mL | Freq: Every evening | ORAL | 0 refills | Status: AC | PRN

## 2024-02-01 MED ORDER — AIRSUPRA 90-80 MCG/ACT IN AERO: 2.0000 | INHALATION_SPRAY | Freq: Two times a day (BID) | RESPIRATORY_TRACT | 1 refills | Status: AC

## 2024-02-01 NOTE — Patient Instructions (Signed)
 Acute Bronchitis, Adult  Acute bronchitis is when air tubes in the lungs (bronchi) suddenly get swollen. The condition can make it hard for you to breathe. In adults, acute bronchitis usually goes away within 2 weeks. A cough caused by bronchitis may last up to 3 weeks. Smoking, allergies, and asthma can make the condition worse. What are the causes? Germs that cause cold and flu (viruses). The most common cause of this condition is the virus that causes the common cold. Bacteria. Substances that bother (irritate) the lungs, including: Smoke from cigarettes and other types of tobacco. Dust and pollen. Fumes from chemicals, gases, or burned fuel. Indoor or outdoor air pollution. What increases the risk? A weak body's defense system. This is also called the immune system. Any condition that affects your lungs and breathing, such as asthma. What are the signs or symptoms? A cough. Coughing up clear, yellow, or green mucus. Making high-pitched whistling sounds when you breathe, most often when you breathe out (wheezing). Runny or stuffy nose. Having too much mucus in your lungs (chest congestion). Shortness of breath. Body aches. A sore throat. How is this treated? Acute bronchitis may go away over time without treatment. Your doctor may tell you to: Drink more fluids. This will help thin your mucus so it is easier to cough up. Use a device that gets medicine into your lungs (inhaler). Use a vaporizer or a humidifier. These are machines that add water to the air. This helps with coughing and poor breathing. Take a medicine that thins mucus and helps clear it from your lungs. Take a medicine that prevents or stops coughing. It is not common to take an antibiotic medicine for this condition. Follow these instructions at home:  Take over-the-counter and prescription medicines only as told by your doctor. Use an inhaler, vaporizer, or humidifier as told by your doctor. Take two teaspoons  (10 mL) of honey at bedtime. This helps lessen your coughing at night. Drink enough fluid to keep your pee (urine) pale yellow. Do not smoke or use any products that contain nicotine or tobacco. If you need help quitting, ask your doctor. Get a lot of rest. Return to your normal activities when your doctor says that it is safe. Keep all follow-up visits. How is this prevented?  Wash your hands often with soap and water for at least 20 seconds. If you cannot use soap and water, use hand sanitizer. Avoid contact with people who have cold symptoms. Try not to touch your mouth, nose, or eyes with your hands. Avoid breathing in smoke or chemical fumes. Make sure to get the flu shot every year. Contact a doctor if: Your symptoms do not get better in 2 weeks. You have trouble coughing up the mucus. Your cough keeps you awake at night. You have a fever. Get help right away if: You cough up blood. You have chest pain. You have very bad shortness of breath. You faint or keep feeling like you are going to faint. You have a very bad headache. Your fever or chills get worse. These symptoms may be an emergency. Get help right away. Call your local emergency services (911 in the [place] Do not wait to see if the symptoms will go away. Do not drive yourself to the hospital. Summary Acute bronchitis is when air tubes in the lungs (bronchi) suddenly get swollen. In adults, acute bronchitis usually goes away within 2 weeks. Drink more fluids. This will help thin your mucus so it is easier  to cough up. Take over-the-counter and prescription medicines only as told by your doctor. Contact a doctor if your symptoms do not improve after 2 weeks of treatment. This information is not intended to replace advice given to you by your health care provider. Make sure you discuss any questions you have with your health care provider. Document Revised: 05/13/2020 Document Reviewed: 05/13/2020 Elsevier Patient  Education  2024 [employer].

## 2024-02-01 NOTE — Assessment & Plan Note (Addendum)
 Symptom management discussed Recommend over-the-counter Mucinex  DM Airsupra  twice a day for 7 days Advised to rest and stay well-hydrated Hycodan syrup at bedtime as needed

## 2024-02-01 NOTE — Progress Notes (Unsigned)
 Billy Bray 54 y.o.   Chief Complaint  Patient presents with   Cough    Cough, congestion and sore throat and body ache symptoms have been going on for about more than a week and pt states that everything has progressed     HISTORY OF PRESENT ILLNESS: This is a 54 y.o. male complaining of flulike symptoms for at least a week progressively getting worse Complaining of cough, chest congestion and sore throat.  Also having bodyaches.  Cough Pertinent negatives include no chest pain, chills, fever, headaches or rash.     Prior to Admission medications  Medication Sig Start Date End Date Taking? Authorizing Provider  albuterol  (PROVENTIL ) (2.5 MG/3ML) 0.083% nebulizer solution Take 3 mLs (2.5 mg total) by nebulization every 6 (six) hours as needed for wheezing or shortness of breath. 04/17/15  Yes Calone, Gregory D, FNP  albuterol  (VENTOLIN  HFA) 108 (90 Base) MCG/ACT inhaler Inhale 1-2 puffs into the lungs every 6 (six) hours as needed for wheezing or shortness of breath. 07/06/19  Yes Neomia Herbel, Emil Schanz, MD  amLODipine  (NORVASC ) 5 MG tablet TAKE 1 TABLET (5 MG TOTAL) BY MOUTH DAILY. 12/01/23  Yes Gevork Ayyad, Emil Schanz, MD  Amphetamine  ER (ADZENYS  XR-ODT) 18.8 MG TBED Take by mouth daily.   Yes [provider]  amphetamine -dextroamphetamine  (ADDERALL) 30 MG tablet Take 1 tablet by mouth daily. 06/02/22  Yes [provider]  cetirizine (ZYRTEC) 5 MG tablet Take 5 mg by mouth daily.   Yes [provider]  gabapentin  (NEURONTIN ) 300 MG capsule TAKE ONE CAPSULE IN AM, EARLY AFTERNOON, AND THREE CAPSULES AT BEDTIME. 09/18/23  Yes Deetra Booton Jose, MD  ipratropium (ATROVENT ) 0.03 % nasal spray Place 2 sprays into both nostrils every 12 (twelve) hours. 01/23/24  Yes Gladis Elsie BROCKS, PA-C  ketoconazole  (NIZORAL ) 2 % cream APPLY TO AFFECTED AREA EVERY DAY 08/26/19  Yes Elyan Vanwieren, Emil Schanz, MD  promethazine -dextromethorphan (PROMETHAZINE -DM) 6.25-15 MG/5ML syrup Take 5  mLs by mouth 4 (four) times daily as needed for cough. 01/23/24  Yes Gladis Elsie BROCKS, PA-C  triamcinolone  cream (KENALOG ) 0.1 % APPLY TO AFFECTED AREA TWICE A DAY 07/09/20  Yes Fionnuala Hemmerich, Emil Schanz, MD  methylPREDNISolone  (MEDROL  DOSEPAK) 4 MG TBPK tablet Take per packet instruction. Taper dosing. 10/27/23   Brooks, Dana, DO  pantoprazole  (PROTONIX ) 40 MG tablet Take 1 tablet (40 mg total) by mouth daily. Patient not taking: Reported on 02/01/2024 07/24/23   Purcell Emil Schanz, MD    Allergies[1]  Patient Active Problem List   Diagnosis Date Noted   Flu-like symptoms 07/12/2023   Acute pain of right shoulder 05/08/2019   Essential hypertension 06/27/2018   Chronic pain syndrome 08/31/2017   Degeneration of intervertebral disc of cervical region 09/13/2016   TMJ dysfunction 08/08/2016   Cervical spondylolysis 08/05/2016   Polyarthralgia 05/24/2016   Asthma 09/14/2015   Secondary male hypogonadism 06/17/2015   ADD (attention deficit disorder) 09/02/2014   Neuropathy 09/02/2014    Past Medical History:  Diagnosis Date   ADD (attention deficit disorder)    Allergy    Arthritis    Asthma    Cervical spondylolysis    Chronic pain syndrome    DDD (degenerative disc disease), cervical    Depression    Hypertension    Kidney stones    Male hypogonadism    Neuropathy    Polyarthralgia    TMJ dysfunction     Past Surgical History:  Procedure Laterality Date   BILATERAL CARPAL TUNNEL RELEASE  2004   CHEST TUBE INSERTION      Social History   Socioeconomic History   Marital status: Divorced    Spouse name: Not on file   Number of children: 2   Years of education: 14   Highest education level: Some college, no degree  Occupational History   Occupation: Personnel Officer  Tobacco Use   Smoking status: Former   Smokeless tobacco: Never  Advertising Account Planner   Vaping status: Never Used  Substance and Sexual Activity   Alcohol use: No   Drug use: Yes    Types: Marijuana   Sexual  activity: Not Currently  Other Topics Concern   Not on file  Social History Narrative   Fun: Entertainment sounds/lights for concerts,   Denies religious beliefs effecting health care.    Social Drivers of Health   Tobacco Use: Medium Risk (12/14/2023)   Patient History    Smoking Tobacco Use: Former    Smokeless Tobacco Use: Never    Passive Exposure: Not on file  Financial Resource Strain: Medium Risk (01/31/2024)   Overall Financial Resource Strain (CARDIA)    Difficulty of Paying Living Expenses: Somewhat hard  Food Insecurity: Food Insecurity Present (01/31/2024)   Epic    Worried About Programme Researcher, Broadcasting/film/video in the Last Year: Sometimes true    Ran Out of Food in the Last Year: Never true  Transportation Needs: No Transportation Needs (01/31/2024)   Epic    Lack of Transportation (Medical): No    Lack of Transportation (Non-Medical): No  Physical Activity: Insufficiently Active (01/31/2024)   Exercise Vital Sign    Days of Exercise per Week: 4 days    Minutes of Exercise per Session: 20 min  Stress: Stress Concern Present (01/31/2024)   [employer] of Occupational Health - Occupational Stress Questionnaire    Feeling of Stress: Rather much  Social Connections: Socially Isolated (01/31/2024)   Social Connection and Isolation Panel    Frequency of Communication with Friends and Family: Once a week    Frequency of Social Gatherings with Friends and Family: Once a week    Attends Religious Services: Never    Database Administrator or Organizations: No    Attends Engineer, Structural: Not on file    Marital Status: Divorced  Intimate Partner Violence: Not on file  Depression (PHQ2-9): Low Risk (09/28/2023)   Depression (PHQ2-9)    PHQ-2 Score: 0  Alcohol Screen: Not on file  Housing: Low Risk (01/31/2024)   Epic    Unable to Pay for Housing in the Last Year: No    Number of Times Moved in the Last Year: 0    Homeless in the Last Year: No  Utilities: Not on file  Health  Literacy: Not on file    Family History  Problem Relation Age of Onset   Healthy Mother    CVA Father    Stroke Maternal Grandmother    Diabetes Maternal Grandmother    Stroke Maternal Grandfather    Diabetes Maternal Grandfather    Colon cancer Paternal Grandfather    Rectal cancer Neg Hx    Stomach cancer Neg Hx      Review of Systems  Constitutional: Negative.  Negative for chills and fever.  HENT:  Positive for congestion.   Respiratory:  Positive for cough.   Cardiovascular: Negative.  Negative for chest pain and palpitations.  Gastrointestinal:  Negative for abdominal pain, diarrhea, nausea and vomiting.  Genitourinary: Negative.  Negative for dysuria  and hematuria.  Skin: Negative.  Negative for rash.  Neurological: Negative.  Negative for dizziness and headaches.  All other systems reviewed and are negative.   Vitals:   02/01/24 1602  BP: (!) 120/100  Pulse: 100  Temp: 98.2 F (36.8 C)  SpO2: 99%    Physical Exam Vitals reviewed.  Constitutional:      Appearance: Normal appearance.  HENT:     Head: Normocephalic.     Mouth/Throat:     Mouth: Mucous membranes are moist.     Pharynx: Oropharynx is clear.  Eyes:     Extraocular Movements: Extraocular movements intact.     Pupils: Pupils are equal, round, and reactive to light.  Cardiovascular:     Rate and Rhythm: Normal rate and regular rhythm.     Pulses: Normal pulses.     Heart sounds: Normal heart sounds.  Pulmonary:     Effort: Pulmonary effort is normal.     Breath sounds: Rhonchi present.  Abdominal:     Palpations: Abdomen is soft.  Musculoskeletal:     Cervical back: No tenderness.  Lymphadenopathy:     Cervical: No cervical adenopathy.  Skin:    General: Skin is warm and dry.     Capillary Refill: Capillary refill takes less than 2 seconds.  Neurological:     General: No focal deficit present.     Mental Status: He is alert and oriented to person, place, and time.  Psychiatric:         Mood and Affect: Mood normal.        Behavior: Behavior normal.      ASSESSMENT & PLAN: Problem List Items Addressed This Visit       Respiratory   Lower respiratory infection - Primary   Upper viral respiratory infection now with secondary bacterial infection Recommend daily azithromycin  for 5 days Clinically stable.  No signs of pneumonia. No red flag signs or symptoms Advised to rest and stay well-hydrated ED precautions given Contact the office if no better or worse during the next several days      Relevant Medications   azithromycin  (ZITHROMAX ) 250 MG tablet   Chest congestion   Symptom management discussed Recommend over-the-counter Mucinex  DM Airsupra  twice a day for 7 days Advised to rest and stay well-hydrated Hycodan syrup at bedtime as needed      Relevant Medications   Albuterol -Budesonide (AIRSUPRA ) 90-80 MCG/ACT AERO   HYDROcodone  bit-homatropine (HYCODAN) 5-1.5 MG/5ML syrup     Other   Flu-like symptoms   Negative flu and COVID tests.   Symptom management discussed. Advised to rest and stay well-hydrated Advised to take Tylenol  and or Advil  as needed.      Relevant Orders   POC COVID-19   POCT Influenza A/B   Patient Instructions  Acute Bronchitis, Adult  Acute bronchitis is when air tubes in the lungs (bronchi) suddenly get swollen. The condition can make it hard for you to breathe. In adults, acute bronchitis usually goes away within 2 weeks. A cough caused by bronchitis may last up to 3 weeks. Smoking, allergies, and asthma can make the condition worse. What are the causes? Germs that cause cold and flu (viruses). The most common cause of this condition is the virus that causes the common cold. Bacteria. Substances that bother (irritate) the lungs, including: Smoke from cigarettes and other types of tobacco. Dust and pollen. Fumes from chemicals, gases, or burned fuel. Indoor or outdoor air pollution. What increases the risk? A weak  body's defense system. This is also called the immune system. Any condition that affects your lungs and breathing, such as asthma. What are the signs or symptoms? A cough. Coughing up clear, yellow, or green mucus. Making high-pitched whistling sounds when you breathe, most often when you breathe out (wheezing). Runny or stuffy nose. Having too much mucus in your lungs (chest congestion). Shortness of breath. Body aches. A sore throat. How is this treated? Acute bronchitis may go away over time without treatment. Your doctor may tell you to: Drink more fluids. This will help thin your mucus so it is easier to cough up. Use a device that gets medicine into your lungs (inhaler). Use a vaporizer or a humidifier. These are machines that add water to the air. This helps with coughing and poor breathing. Take a medicine that thins mucus and helps clear it from your lungs. Take a medicine that prevents or stops coughing. It is not common to take an antibiotic medicine for this condition. Follow these instructions at home:  Take over-the-counter and prescription medicines only as told by your doctor. Use an inhaler, vaporizer, or humidifier as told by your doctor. Take two teaspoons (10 mL) of honey at bedtime. This helps lessen your coughing at night. Drink enough fluid to keep your pee (urine) pale yellow. Do not smoke or use any products that contain nicotine or tobacco. If you need help quitting, ask your doctor. Get a lot of rest. Return to your normal activities when your doctor says that it is safe. Keep all follow-up visits. How is this prevented?  Wash your hands often with soap and water for at least 20 seconds. If you cannot use soap and water, use hand sanitizer. Avoid contact with people who have cold symptoms. Try not to touch your mouth, nose, or eyes with your hands. Avoid breathing in smoke or chemical fumes. Make sure to get the flu shot every year. Contact a doctor  if: Your symptoms do not get better in 2 weeks. You have trouble coughing up the mucus. Your cough keeps you awake at night. You have a fever. Get help right away if: You cough up blood. You have chest pain. You have very bad shortness of breath. You faint or keep feeling like you are going to faint. You have a very bad headache. Your fever or chills get worse. These symptoms may be an emergency. Get help right away. Call your local emergency services (911 in the [place] Do not wait to see if the symptoms will go away. Do not drive yourself to the hospital. Summary Acute bronchitis is when air tubes in the lungs (bronchi) suddenly get swollen. In adults, acute bronchitis usually goes away within 2 weeks. Drink more fluids. This will help thin your mucus so it is easier to cough up. Take over-the-counter and prescription medicines only as told by your doctor. Contact a doctor if your symptoms do not improve after 2 weeks of treatment. This information is not intended to replace advice given to you by your health care provider. Make sure you discuss any questions you have with your health care provider. Document Revised: 05/13/2020 Document Reviewed: 05/13/2020 Elsevier Patient Education  2024 [employer]     Emil Schaumann, MD  [facility] at [facility]    [1]  Allergies Allergen Reactions   Amoxicillin Hives

## 2024-02-01 NOTE — Assessment & Plan Note (Signed)
 Upper viral respiratory infection now with secondary bacterial infection Recommend daily azithromycin  for 5 days Clinically stable.  No signs of pneumonia. No red flag signs or symptoms Advised to rest and stay well-hydrated ED precautions given Contact the office if no better or worse during the next several days

## 2024-02-01 NOTE — Assessment & Plan Note (Addendum)
 Negative flu and COVID tests.   Symptom management discussed. Advised to rest and stay well-hydrated Advised to take Tylenol  and or Advil  as needed.
